# Patient Record
Sex: Female | Born: 1940
Health system: Southern US, Community
[De-identification: ages and names within clinical notes are randomized; demographics above are authoritative.]

## PROBLEM LIST (undated history)

## (undated) DIAGNOSIS — F419 Anxiety disorder, unspecified: Secondary | ICD-10-CM

## (undated) DIAGNOSIS — I2510 Atherosclerotic heart disease of native coronary artery without angina pectoris: Secondary | ICD-10-CM

## (undated) DIAGNOSIS — I340 Nonrheumatic mitral (valve) insufficiency: Secondary | ICD-10-CM

## (undated) DIAGNOSIS — E785 Hyperlipidemia, unspecified: Secondary | ICD-10-CM

## (undated) DIAGNOSIS — F329 Major depressive disorder, single episode, unspecified: Secondary | ICD-10-CM

## (undated) DIAGNOSIS — E039 Hypothyroidism, unspecified: Secondary | ICD-10-CM

## (undated) DIAGNOSIS — I119 Hypertensive heart disease without heart failure: Secondary | ICD-10-CM

## (undated) DIAGNOSIS — I739 Peripheral vascular disease, unspecified: Secondary | ICD-10-CM

## (undated) DIAGNOSIS — I779 Disorder of arteries and arterioles, unspecified: Secondary | ICD-10-CM

## (undated) DIAGNOSIS — K219 Gastro-esophageal reflux disease without esophagitis: Secondary | ICD-10-CM

## (undated) DIAGNOSIS — F32A Depression, unspecified: Secondary | ICD-10-CM

## (undated) HISTORY — DX: Nonrheumatic mitral (valve) insufficiency: I34.0

## (undated) HISTORY — DX: Gastro-esophageal reflux disease without esophagitis: K21.9

## (undated) HISTORY — DX: Hypothyroidism, unspecified: E03.9

## (undated) HISTORY — DX: Atherosclerotic heart disease of native coronary artery without angina pectoris: I25.10

## (undated) HISTORY — DX: Depression, unspecified: F32.A

## (undated) HISTORY — DX: Major depressive disorder, single episode, unspecified: F32.9

## (undated) HISTORY — DX: Hyperlipidemia, unspecified: E78.5

## (undated) HISTORY — DX: Anxiety disorder, unspecified: F41.9

## (undated) HISTORY — PX: CORONARY STENT PLACEMENT: SHX1402

---

## 1998-02-23 ENCOUNTER — Ambulatory Visit (HOSPITAL_COMMUNITY): Admission: RE | Admit: 1998-02-23 | Discharge: 1998-02-23 | Payer: Self-pay | Admitting: Internal Medicine

## 2000-01-31 ENCOUNTER — Ambulatory Visit (HOSPITAL_COMMUNITY): Admission: RE | Admit: 2000-01-31 | Discharge: 2000-01-31 | Payer: Self-pay | Admitting: Otolaryngology

## 2009-12-06 ENCOUNTER — Encounter: Payer: Self-pay | Admitting: Cardiovascular Disease

## 2009-12-19 ENCOUNTER — Encounter: Payer: Self-pay | Admitting: Cardiovascular Disease

## 2009-12-19 ENCOUNTER — Ambulatory Visit: Payer: Self-pay | Admitting: Cardiovascular Disease

## 2009-12-21 ENCOUNTER — Ambulatory Visit: Payer: Self-pay | Admitting: Cardiology

## 2009-12-21 ENCOUNTER — Inpatient Hospital Stay (HOSPITAL_BASED_OUTPATIENT_CLINIC_OR_DEPARTMENT_OTHER): Admission: RE | Admit: 2009-12-21 | Discharge: 2009-12-21 | Payer: Self-pay | Admitting: Cardiology

## 2009-12-22 HISTORY — PX: CORONARY ANGIOPLASTY WITH STENT PLACEMENT: SHX49

## 2009-12-22 HISTORY — PX: CARDIAC CATHETERIZATION: SHX172

## 2009-12-25 ENCOUNTER — Inpatient Hospital Stay (HOSPITAL_COMMUNITY): Admission: RE | Admit: 2009-12-25 | Discharge: 2009-12-26 | Payer: Self-pay | Admitting: Cardiovascular Disease

## 2009-12-25 ENCOUNTER — Ambulatory Visit: Payer: Self-pay | Admitting: Cardiovascular Disease

## 2010-01-02 DIAGNOSIS — I10 Essential (primary) hypertension: Secondary | ICD-10-CM | POA: Insufficient documentation

## 2010-01-02 DIAGNOSIS — E039 Hypothyroidism, unspecified: Secondary | ICD-10-CM | POA: Insufficient documentation

## 2010-01-02 DIAGNOSIS — K219 Gastro-esophageal reflux disease without esophagitis: Secondary | ICD-10-CM

## 2010-01-02 DIAGNOSIS — E785 Hyperlipidemia, unspecified: Secondary | ICD-10-CM

## 2010-01-02 DIAGNOSIS — F411 Generalized anxiety disorder: Secondary | ICD-10-CM | POA: Insufficient documentation

## 2010-01-04 ENCOUNTER — Telehealth: Payer: Self-pay | Admitting: Cardiovascular Disease

## 2010-01-04 ENCOUNTER — Telehealth (INDEPENDENT_AMBULATORY_CARE_PROVIDER_SITE_OTHER): Payer: Self-pay | Admitting: *Deleted

## 2010-01-04 ENCOUNTER — Ambulatory Visit: Payer: Self-pay | Admitting: Cardiology

## 2010-01-04 ENCOUNTER — Inpatient Hospital Stay (HOSPITAL_COMMUNITY): Admission: EM | Admit: 2010-01-04 | Discharge: 2010-01-05 | Payer: Self-pay | Admitting: Emergency Medicine

## 2010-01-09 ENCOUNTER — Ambulatory Visit: Payer: Self-pay | Admitting: Cardiovascular Disease

## 2010-01-10 ENCOUNTER — Telehealth (INDEPENDENT_AMBULATORY_CARE_PROVIDER_SITE_OTHER): Payer: Self-pay | Admitting: *Deleted

## 2010-01-19 ENCOUNTER — Encounter: Payer: Self-pay | Admitting: Cardiovascular Disease

## 2010-01-19 ENCOUNTER — Ambulatory Visit: Payer: Self-pay

## 2010-01-19 DIAGNOSIS — G459 Transient cerebral ischemic attack, unspecified: Secondary | ICD-10-CM | POA: Insufficient documentation

## 2010-01-25 ENCOUNTER — Telehealth: Payer: Self-pay | Admitting: Cardiovascular Disease

## 2010-01-29 ENCOUNTER — Telehealth: Payer: Self-pay | Admitting: Cardiovascular Disease

## 2010-02-04 ENCOUNTER — Emergency Department (HOSPITAL_COMMUNITY): Admission: EM | Admit: 2010-02-04 | Discharge: 2010-02-04 | Payer: Self-pay | Admitting: Emergency Medicine

## 2010-02-16 ENCOUNTER — Ambulatory Visit: Payer: Self-pay | Admitting: Cardiovascular Disease

## 2010-04-30 ENCOUNTER — Telehealth: Payer: Self-pay | Admitting: Cardiovascular Disease

## 2010-04-30 ENCOUNTER — Ambulatory Visit: Payer: Self-pay | Admitting: Cardiovascular Disease

## 2010-05-26 ENCOUNTER — Inpatient Hospital Stay (HOSPITAL_COMMUNITY): Admission: EM | Admit: 2010-05-26 | Discharge: 2010-05-28 | Payer: Self-pay | Admitting: Emergency Medicine

## 2010-10-23 NOTE — Progress Notes (Signed)
  Phone Note Outgoing Call   Call placed by: Dessie Coma LPN  Call placed to: Patient Summary of Call: Patient notified per Dr. Freida Busman, labs were normal.

## 2010-10-23 NOTE — Progress Notes (Signed)
Summary: stent last week/ c/o pain shoulder blade, back, left jaw  Phone Note Call from Patient Call back at Home Phone [phone]   Caller: Patient Reason for Call: Talk to Nurse Summary of Call: stent place last week. c/o pain in shoulder blade/ back/ left jaw.  Initial call taken by: Lorne Skeens,  January 04, 2010 2:54 PM  Follow-up for Phone Call        I spoke with the pt and she is having discomfort between her shoulder blades and left jaw.  The pt's symptoms started this morning.  The pain in her back comes and goes and her jaw pain has been constant. These symptoms are similar to what the pt had prior to her stent placement on 12/25/09.  I had the pt take a NTG while on the phone and she noticied that some of her jaw discomfort was relieved. I advised the pt that she should go back to the hospital for evaluation.  The pt will have a family member drive her to [facility].  Trish notified. Follow-up by: Julieta Gutting, RN, BSN,  January 04, 2010 3:03 PM

## 2010-10-23 NOTE — Miscellaneous (Signed)
Summary: Orders Update  Clinical Lists Changes  Problems: Added new problem of TIA (ICD-435.9) Orders: Added new Test order of Carotid Duplex (Carotid Duplex) - Signed 

## 2010-10-23 NOTE — Progress Notes (Signed)
----   Converted from flag ---- ---- 01/09/2010 4:00 PM, Marilynne Halsted, CMA, AAMA wrote: PT HAS SECURE HOR/UHC MCR, NO PAC RQD  ---- 01/09/2010 2:36 PM, Dessie Coma wrote: Carotid Duplex scheduled for 01/19/10 at [facility]. for TIA-435.9 Thanks, Victorino Dike ------------------------------

## 2010-10-23 NOTE — Progress Notes (Signed)
  Phone Note Outgoing Call   Call placed by: Dessie Coma  LPN,  April 30, 2010 4:04 PM Call placed to: Patient Summary of Call: Patient notified per Dr. Kirke Corin, lab results were fine.

## 2010-10-23 NOTE — Progress Notes (Signed)
  Phone Note Outgoing Call   Call placed by: Dessie Coma LPN Call placed to: Patient Summary of Call: Patient notified per Dr. Freida Busman, ultrasound of arteries in neck was normal meaning she does not have any significant blockages.  To f/u in office in 4 months.

## 2010-10-23 NOTE — Progress Notes (Signed)
  [place] [facility] faxed me records on this pt,gave to Hosie Poisson Mesiemore  January 04, 2010 4:17 PM

## 2010-11-26 ENCOUNTER — Telehealth (INDEPENDENT_AMBULATORY_CARE_PROVIDER_SITE_OTHER): Payer: Self-pay | Admitting: *Deleted

## 2010-11-26 ENCOUNTER — Ambulatory Visit (INDEPENDENT_AMBULATORY_CARE_PROVIDER_SITE_OTHER): Payer: Medicare Other | Admitting: Cardiovascular Disease

## 2010-11-26 DIAGNOSIS — I2510 Atherosclerotic heart disease of native coronary artery without angina pectoris: Secondary | ICD-10-CM

## 2010-11-26 DIAGNOSIS — E7800 Pure hypercholesterolemia, unspecified: Secondary | ICD-10-CM

## 2010-11-26 DIAGNOSIS — R011 Cardiac murmur, unspecified: Secondary | ICD-10-CM

## 2010-11-27 ENCOUNTER — Ambulatory Visit (HOSPITAL_COMMUNITY): Payer: Medicare Other | Attending: Cardiovascular Disease

## 2010-11-27 ENCOUNTER — Encounter: Payer: Self-pay | Admitting: Cardiovascular Disease

## 2010-11-27 DIAGNOSIS — R0609 Other forms of dyspnea: Secondary | ICD-10-CM | POA: Insufficient documentation

## 2010-11-27 DIAGNOSIS — I10 Essential (primary) hypertension: Secondary | ICD-10-CM | POA: Insufficient documentation

## 2010-11-27 DIAGNOSIS — R0989 Other specified symptoms and signs involving the circulatory and respiratory systems: Secondary | ICD-10-CM | POA: Insufficient documentation

## 2010-11-27 DIAGNOSIS — I059 Rheumatic mitral valve disease, unspecified: Secondary | ICD-10-CM | POA: Insufficient documentation

## 2010-11-27 DIAGNOSIS — I2510 Atherosclerotic heart disease of native coronary artery without angina pectoris: Secondary | ICD-10-CM | POA: Insufficient documentation

## 2010-11-27 DIAGNOSIS — E785 Hyperlipidemia, unspecified: Secondary | ICD-10-CM | POA: Insufficient documentation

## 2010-11-27 DIAGNOSIS — R011 Cardiac murmur, unspecified: Secondary | ICD-10-CM | POA: Insufficient documentation

## 2010-11-27 DIAGNOSIS — E039 Hypothyroidism, unspecified: Secondary | ICD-10-CM | POA: Insufficient documentation

## 2010-11-29 ENCOUNTER — Telehealth: Payer: Self-pay | Admitting: Cardiovascular Disease

## 2010-12-04 NOTE — Progress Notes (Signed)
----   Converted from flag ---- ---- 11/26/2010 3:46 PM, Era Bumpers wrote: No precert required for [facility] mcr Echo  ---- 11/26/2010 3:37 PM, Marilynne Halsted, CMA, AAMA wrote:   ---- 11/26/2010 2:12 PM, Dessie Coma  LPN wrote: Echo scheduled at [facility].  for tomorrow 11/27/10 for Dx: Willis Modena.  thanks,Maitri Schnoebelen ------------------------------

## 2010-12-04 NOTE — Progress Notes (Signed)
Summary: echo results  Phone Note Outgoing Call   Call placed by: Dessie Coma  LPN,  November 29, 2010 4:36 PM Call placed to: Patient Summary of Call: Patient notified per Dr. Kirke Corin, Echo done on 11/27/10 is fine.  There is a mild leaking in a heart valve but not serious.

## 2010-12-06 LAB — CBC
MCH: 33 pg (ref 26.0–34.0)
MCHC: 33.5 g/dL (ref 30.0–36.0)
MCV: 95.4 fL (ref 78.0–100.0)
Platelets: 201 10*3/uL (ref 150–400)
RBC: 3.72 MIL/uL — ABNORMAL LOW (ref 3.87–5.11)
RDW: 11.9 % (ref 11.5–15.5)
WBC: 7.6 10*3/uL (ref 4.0–10.5)

## 2010-12-06 LAB — URINE MICROSCOPIC-ADD ON

## 2010-12-06 LAB — POCT I-STAT, CHEM 8
BUN: 8 mg/dL (ref 6–23)
Calcium, Ion: 1.15 mmol/L (ref 1.12–1.32)
Creatinine, Ser: 0.9 mg/dL (ref 0.4–1.2)
Hemoglobin: 13.3 g/dL (ref 12.0–15.0)
Sodium: 140 mEq/L (ref 135–145)
TCO2: 26 mmol/L (ref 0–100)

## 2010-12-06 LAB — COMPREHENSIVE METABOLIC PANEL
Albumin: 3.3 g/dL — ABNORMAL LOW (ref 3.5–5.2)
BUN: 7 mg/dL (ref 6–23)
Calcium: 8.7 mg/dL (ref 8.4–10.5)
Chloride: 105 mEq/L (ref 96–112)
Creatinine, Ser: 0.7 mg/dL (ref 0.4–1.2)
GFR calc Af Amer: >60 mL/min (ref >60)
GFR calc non Af Amer: >60 mL/min (ref >60)
Glucose, Bld: 100 mg/dL — ABNORMAL HIGH (ref 70–99)
Potassium: 4.1 mEq/L (ref 3.5–5.1)
Total Protein: 5.9 g/dL — ABNORMAL LOW (ref 6.0–8.3)

## 2010-12-06 LAB — DIFFERENTIAL
Basophils Absolute: 0 10*3/uL (ref 0.0–0.1)
Basophils Absolute: 0 10*3/uL (ref 0.0–0.1)
Eosinophils Absolute: 0.1 10*3/uL (ref 0.0–0.7)
Eosinophils Absolute: 0.2 10*3/uL (ref 0.0–0.7)
Eosinophils Relative: 2 % (ref 0–5)
Lymphocytes Relative: 18 % (ref 12–46)
Lymphocytes Relative: 28 % (ref 12–46)
Lymphs Abs: 1.2 10*3/uL (ref 0.7–4.0)
Monocytes Absolute: 0.8 10*3/uL (ref 0.1–1.0)

## 2010-12-06 LAB — CULTURE, BLOOD (ROUTINE X 2)

## 2010-12-06 LAB — CARDIAC PANEL(CRET KIN+CKTOT+MB+TROPI)
CK, MB: 0.5 ng/mL (ref 0.3–4.0)
Total CK: 39 U/L (ref 7–177)
Total CK: 42 U/L (ref 7–177)

## 2010-12-06 LAB — URINALYSIS, ROUTINE W REFLEX MICROSCOPIC
Bilirubin Urine: NEGATIVE
Ketones, ur: NEGATIVE mg/dL
Nitrite: NEGATIVE
Protein, ur: NEGATIVE mg/dL
Specific Gravity, Urine: 1.004 — ABNORMAL LOW (ref 1.005–1.030)
Urobilinogen, UA: 0.2 mg/dL (ref 0.0–1.0)
pH: 7.5 (ref 5.0–8.0)

## 2010-12-06 LAB — CK TOTAL AND CKMB (NOT AT ARMC)
CK, MB: 0.4 ng/mL (ref 0.3–4.0)
Relative Index: INVALID (ref 0.0–2.5)

## 2010-12-06 LAB — POCT CARDIAC MARKERS: Myoglobin, poc: 45.3 ng/mL (ref 12–200)

## 2010-12-06 LAB — T3, FREE: T3, Free: 2.8 pg/mL (ref 2.3–4.2)

## 2010-12-10 LAB — POCT CARDIAC MARKERS
CKMB, poc: <1 ng/mL — ABNORMAL LOW (ref 1.0–8.0)
Troponin i, poc: <0.05 ng/mL (ref 0.00–0.09)
Troponin i, poc: <0.05 ng/mL (ref 0.00–0.09)

## 2010-12-10 LAB — DIFFERENTIAL
Basophils Absolute: 0 10*3/uL (ref 0.0–0.1)
Eosinophils Absolute: 0.1 10*3/uL (ref 0.0–0.7)
Eosinophils Relative: 1 % (ref 0–5)
Lymphocytes Relative: 21 % (ref 12–46)
Lymphs Abs: 1.2 10*3/uL (ref 0.7–4.0)
Monocytes Absolute: 0.6 10*3/uL (ref 0.1–1.0)

## 2010-12-10 LAB — BASIC METABOLIC PANEL
Chloride: 105 mEq/L (ref 96–112)
GFR calc non Af Amer: >60 mL/min (ref >60)
Glucose, Bld: 91 mg/dL (ref 70–99)
Potassium: 3.2 mEq/L — ABNORMAL LOW (ref 3.5–5.1)
Sodium: 137 mEq/L (ref 135–145)

## 2010-12-10 LAB — CBC
HCT: 32.9 % — ABNORMAL LOW (ref 36.0–46.0)
Hemoglobin: 11.3 g/dL — ABNORMAL LOW (ref 12.0–15.0)
MCV: 100.7 fL — ABNORMAL HIGH (ref 78.0–100.0)
RDW: 12.8 % (ref 11.5–15.5)

## 2010-12-11 LAB — CBC
HCT: 31.7 % — ABNORMAL LOW (ref 36.0–46.0)
Hemoglobin: 10.7 g/dL — ABNORMAL LOW (ref 12.0–15.0)
MCHC: 33.8 g/dL (ref 30.0–36.0)
RDW: 12.8 % (ref 11.5–15.5)

## 2010-12-12 LAB — BASIC METABOLIC PANEL
BUN: 5 mg/dL — ABNORMAL LOW (ref 6–23)
BUN: 6 mg/dL (ref 6–23)
CO2: 27 mEq/L (ref 19–32)
CO2: 27 mEq/L (ref 19–32)
CO2: 28 mEq/L (ref 19–32)
Calcium: 8.4 mg/dL (ref 8.4–10.5)
Calcium: 8.7 mg/dL (ref 8.4–10.5)
Chloride: 105 mEq/L (ref 96–112)
Chloride: 106 mEq/L (ref 96–112)
Creatinine, Ser: 0.63 mg/dL (ref 0.4–1.2)
Creatinine, Ser: 0.69 mg/dL (ref 0.4–1.2)
GFR calc Af Amer: >60 mL/min (ref >60)
GFR calc Af Amer: >60 mL/min (ref >60)
Glucose, Bld: 110 mg/dL — ABNORMAL HIGH (ref 70–99)
Potassium: 3.7 mEq/L (ref 3.5–5.1)
Sodium: 139 mEq/L (ref 135–145)
Sodium: 141 mEq/L (ref 135–145)

## 2010-12-12 LAB — POCT I-STAT, CHEM 8
BUN: 6 mg/dL (ref 6–23)
Calcium, Ion: 1.2 mmol/L (ref 1.12–1.32)
Chloride: 103 meq/L (ref 96–112)
Creatinine, Ser: 0.7 mg/dL (ref 0.4–1.2)
Glucose, Bld: 102 mg/dL — ABNORMAL HIGH (ref 70–99)
HCT: 35 % — ABNORMAL LOW (ref 36.0–46.0)
Hemoglobin: 11.9 g/dL — ABNORMAL LOW (ref 12.0–15.0)
Potassium: 3.3 mEq/L — ABNORMAL LOW (ref 3.5–5.1)
Sodium: 141 mEq/L (ref 135–145)
TCO2: 29 mmol/L (ref 0–100)

## 2010-12-12 LAB — POCT CARDIAC MARKERS
CKMB, poc: <1 ng/mL — ABNORMAL LOW (ref 1.0–8.0)
Myoglobin, poc: 30 ng/mL (ref 12–200)
Troponin i, poc: <0.05 ng/mL (ref 0.00–0.09)

## 2010-12-12 LAB — PROTIME-INR: Prothrombin Time: 12.7 seconds (ref 11.6–15.2)

## 2010-12-12 LAB — CBC
HCT: 33.3 % — ABNORMAL LOW (ref 36.0–46.0)
HCT: 37.8 % (ref 36.0–46.0)
Hemoglobin: 11.6 g/dL — ABNORMAL LOW (ref 12.0–15.0)
MCHC: 33.5 g/dL (ref 30.0–36.0)
MCHC: 34.4 g/dL (ref 30.0–36.0)
MCV: 100.9 fL — ABNORMAL HIGH (ref 78.0–100.0)
MCV: 101.7 fL — ABNORMAL HIGH (ref 78.0–100.0)
Platelets: 218 10*3/uL (ref 150–400)
Platelets: 231 10*3/uL (ref 150–400)
Platelets: 234 10*3/uL (ref 150–400)
Platelets: 236 10*3/uL (ref 150–400)
RBC: 3.52 MIL/uL — ABNORMAL LOW (ref 3.87–5.11)
RDW: 12.6 % (ref 11.5–15.5)
RDW: 12.8 % (ref 11.5–15.5)
RDW: 12.9 % (ref 11.5–15.5)
WBC: 7.2 10*3/uL (ref 4.0–10.5)
WBC: 7.3 10*3/uL (ref 4.0–10.5)

## 2010-12-12 LAB — CARDIAC PANEL(CRET KIN+CKTOT+MB+TROPI)
Relative Index: INVALID (ref 0.0–2.5)
Troponin I: 0.02 ng/mL (ref 0.00–0.06)

## 2010-12-12 LAB — APTT
aPTT: 30 seconds (ref 24–37)
aPTT: 31 seconds (ref 24–37)

## 2010-12-12 LAB — DIFFERENTIAL
Basophils Absolute: 0 10*3/uL (ref 0.0–0.1)
Basophils Relative: 0 % (ref 0–1)
Eosinophils Absolute: 0 10*3/uL (ref 0.0–0.7)
Eosinophils Relative: 1 % (ref 0–5)
Neutrophils Relative %: 68 % (ref 43–77)

## 2010-12-12 LAB — CK TOTAL AND CKMB (NOT AT ARMC): Total CK: 40 U/L (ref 7–177)

## 2010-12-14 NOTE — Assessment & Plan Note (Signed)
[facility] CARDIOLOGY OFFICE NOTE  NAME:Janice Glass                    MRN:          [id number] DATE:11/26/2010                            DOB:          07/02/1941   Janice Glass is a 70 year old female who is here today for a followup visit.  She has the following problem list: 1. Coronary artery disease status post percutaneous coronary     intervention with a drug-eluting stent to the proximal left     anterior descending artery in April 2011.  The stent was 3.0 x 15     mm PROMUS.  Repeat catheterization showed patent stent.  She does     have mild 30% stenosis in proximal RCA.  Ejection fraction was 65%. 2. Hypertension. 3. Hyperlipidemia. 4. Hypothyroidism. 5. Depression.  INTERVAL HISTORY:  Since her last visit, the patient was hospitalized for dehydration and depression.  She was started on Cymbalta and subsequently improved.  The patient continues to complain of easy bruising with any slight trauma.  She is also complaining about the cost of Plavix and really wants to get off that medication.  Overall, she has not had any significant episodes of chest pain.  She does have slight dyspnea and she is not aware of any previous history of a heart murmur. She denies any dizziness, syncope, or presyncope.  MEDICATIONS: 1. Levothyroxine 50 mcg once daily. 2. Amlodipine 5 mg once daily. 3. Simvastatin 40 mg at bedtime. 4. Plavix 75 mg once daily. 5. Cymbalta 60 mg once daily. 6. Metoprolol 25 mg twice daily. 7. Aspirin 81 mg once daily. 8. Fish oil 1000 mg twice daily. 9. Ranitidine 150 mg twice daily.  PHYSICAL EXAMINATION:  VITAL SIGNS:  Weight is 128 pounds, blood pressure is 113/63, pulse is 64, oxygen saturation is 97% on room air. NECK:  No JVD or carotid bruits. LUNGS:  Clear to auscultation. HEART:  Regular rate and rhythm with no gallops.  There is a 2/6 systolic ejection murmur at the base of  the heart and also at the left sternal border. ABDOMEN:  Benign, nontender, nondistended. EXTREMITIES:  No clubbing, cyanosis, or edema. SKIN:  She has scattered bruising in the upper and lower extremities.  IMPRESSION: 1. Coronary artery disease status post angioplasty and drug-eluting     stent placement to the proximal left anterior descending in April     2011.  The patient is stable from a cardiac standpoint with no     symptoms suggestive of angina.  She is complaining about taking     Plavix.  Her concerns are easy and extensive bruising as well as     the cost.  She did have a drug-eluting stent and thus I recommend     minimal treatment of 1 year.  Her Plavix can be stopped in April of     this year.  At that time, I asked her to start taking aspirin 325     mg once daily instead of 81 mg once daily.  We will continue with     her other cardiac medications. 2. Mild dyspnea with a new  murmur:  We will request an echocardiogram     for further evaluation.  The murmur is most likely benign, but we     will get further evaluation given her symptoms of dyspnea. 3. Hyperlipidemia:  We will continue treatment with simvastatin. 4. Hypertension:  Blood pressure is well-controlled.  She will follow     up in 6 months from now or earlier if needed.    Janice Bears, MD Electronically Signed   MA/MedQ  DD: 11/26/2010  DT: 11/27/2010  Job #: [id number]

## 2011-01-22 ENCOUNTER — Other Ambulatory Visit: Payer: Self-pay | Admitting: Family Medicine

## 2011-01-22 ENCOUNTER — Encounter (INDEPENDENT_AMBULATORY_CARE_PROVIDER_SITE_OTHER): Payer: Medicare Other | Admitting: *Deleted

## 2011-01-22 DIAGNOSIS — I6529 Occlusion and stenosis of unspecified carotid artery: Secondary | ICD-10-CM

## 2011-01-29 ENCOUNTER — Encounter: Payer: Self-pay | Admitting: Family Medicine

## 2011-02-05 NOTE — Assessment & Plan Note (Signed)
[facility]                        [place] CARDIOLOGY OFFICE NOTE   NAME:Janice Glass, Janice Glass                    MRN:          [id number]  DATE:04/30/2010                            DOB:          1941/07/07    PROBLEM LIST:  1. Coronary artery disease, status post percutaneous coronary      intervention of proximal left anterior descending with a 3.0 x 15      mm Promus stent in April 2011 after positive stress test.  She had      relook catheterization for chest pain which showed patent stent.      At that time, catheterization showed mild-to-moderate disease as      well and diagonal branches which were relatively small size.  The      right coronary artery was relatively small with a 30% proximal      stenosis.  Ejection fraction was 65%.  2. Hypertension.  3. Hyperlipidemia.  4. Hypothyroidism.   INTERVAL HISTORY:  Since the last visit the patient has been doing well  overall from a cardiac standpoint with no complaints of chest pain,  dyspnea, palpitations, presyncope, or syncope.  She is complaining of  generalized bruising in the upper and lower extremities with any kind of  little trauma.  She does have pets at home including a dog and a cat,  and these seems to cause some of the problems.  She denies any blood in  the stool or any other bleeding manifestations.  She continues to be  active and does all her activities of daily living.  The dose of her  aspirin was decreased to 81 mg in July.  She is still taking Plavix.   MEDICATIONS:  1. Levothyroxine 50 mcg once daily.  2. Amlodipine 5 mg daily.  3. Simvastatin 40 mg nightly.  4. Plavix 75 mg once daily.  5. Citalopram 40 mg once daily.  6. Metoprolol 25 mg twice daily.  7. Aspirin 81 mg once daily.  8. Fish oil 1000 mg twice daily.  9. Ranitidine 150 mg q.12 h.  10.Vitamin D 3000 units twice daily.  11.Vitamin B12 100 mcg once daily.   ALLERGIES:  SULFA and CODEINE.   PHYSICAL  EXAMINATION:  VITAL SIGNS:  Weight of 120 pounds, blood  pressure is 123/74, pulse is 74, oxygen saturation is 97% on room air.  NECK:  No JVD or carotid bruits.  LUNGS:  Clear to auscultation.  CARDIOVASCULAR:  Regular rate and rhythm with no gallops or murmurs.  ABDOMEN:  Benign, nontender, nondistended.  EXTREMITIES:  With no clubbing, cyanosis, or edema.  Pedal pulses are  normal.  SKIN:  Scattered and extensive bruises in the upper and lower  extremities.   IMPRESSION:  1. Coronary artery disease, status post angioplasty and a drug-eluting      stent placement to the proximal left anterior descending in April      2011:  The patient is doing well from a cardiac standpoint with no      symptoms suggestive of angina at this time.  We will continue with  dual antiplatelet therapy at least until April 2012.  We will      continue aggressive treatment of risk factors.  2. Extensive bruising in the upper and lower extremities since she has      been on dual antiplatelet therapy.  She does have history of anemia      as well.  Unfortunately, we cannot stop any of her aspirin and      Plavix safely at this time due to presence of a drug-eluting stent      to proximal left anterior descending.  We will have to make sure      that there is no other etiologies to explain this.  Thus, I will go      ahead and check her CBC, CMP, and PT/INR.  I discussed with her the      importance of balancing her diet and eating more vegetables and      fruits to get her nutritional needs.  3. Hyperlipidemia.  Her lipid panel is at target with LDL of 53 and      HDL of 62.  4. Hypertension.  Blood pressure is well controlled.  The patient will      follow up in 6 months from now or earlier if needed.     Lorine Bears, MD  Electronically Signed    MA/MedQ  DD: 04/30/2010  DT: 04/30/2010  Job #: [id number]

## 2011-02-05 NOTE — Letter (Signed)
December 19, 2009    Feliciana Rossetti, MD  [address]  [place], [place]  [address]   RE:  CALIROSE, MCCANCE  MRN:  [id number]  /  DOB:  1940-09-26   Dear Dr. Shary Decamp:   I had the pleasure of seeing Ms. Patrone in clinic this morning.  As you  know, she is a 70 year old female recently had a positive stress test  and has been having some atypical discomfort in her back.  More  concerning to me is the increased dyspnea on exertion that she has been  experiencing for quite sometime.  I have asked her to institute therapy  with aspirin 81 mg daily and will proceed with a left heart  catheterization to definitively evaluate her coronary arteries.  We will  keep you informed as to the results.   Thank for the referral as patient.  Please contact my office if I can be  of further assistance.    Sincerely,      Brayton El, MD  Electronically Signed    SGA/MedQ  DD: 12/19/2009  DT: 12/19/2009  Job #: [id number]

## 2011-02-05 NOTE — Assessment & Plan Note (Signed)
[facility]                        [place] CARDIOLOGY OFFICE NOTE   NAME:Glass, Janice DARTY                    MRN:          [id number]  DATE:12/19/2009                            DOB:          July 26, 1941    CHIEF COMPLAINT:  Positive stress test.   HISTORY OF PRESENT ILLNESS:  Janice Glass is a 70 year old white female  with past medical history significant for hypertension, hyperlipidemia,  hypothyroidism, and anxiety presenting after a positive stress test.  The patient states that 4 weeks ago when she was lifting something above  her head with both arms, she had sudden onset of back pain.  She states  it was in her upper back and around her spine.  Since then she has been  having intermittent back pain that is sometimes brought on by exertion  and relieved with pain medications.  Of note, nitroglycerin was not  helpful in relieving this pain.  The patient also states she has been  experiencing increased shortness of breath on exertion over the past  several weeks.  She thinks that this shortness of breath on exertion  predates the back discomfort.  Along with these symptoms, the patient  states she has had intermittent chest discomfort that feels like a  squeezing.  She states that it is happening less frequently than once a  week.  She has been seen in the emergency room for these symptoms and  recently had a stress test ordered by Dr. Shary Decamp that noted inferior and  inferobasal ischemia with an ejection fraction of 78%.   PAST MEDICAL HISTORY:  As above in HPI.  The patient also states that  she was told she had a fracture in her spine in the past.  She has had  several C-sections.  Other medical history as in HPI.   SOCIAL HISTORY:  No tobacco, no alcohol.   FAMILY HISTORY:  Negative for premature coronary disease.   ALLERGIES:  SULFA.   MEDICATIONS:  1. Ranitidine 300 mg daily.  2. Levothyroxine 50 mcg daily.  3. Amlodipine 5 mg  daily.  4. Citalopram 20 mg daily.   REVIEW OF SYSTEMS:  As in HPI.  In addition, the patient endorses  anxiety in concern of her husband who has had several strokes in the  past couple of years.  Other systems as in HPI, otherwise negative.   PHYSICAL EXAMINATION:  VITAL SIGNS:  Blood pressure is 139/80, pulse 72,  saturating 98% on room air, she weighs 129 pounds.  GENERAL:  No acute distress.  HEENT:  Normocephalic, atraumatic.  NECK:  Supple.  There is no JVD.  No carotid bruits.  HEART:  Regular rate and rhythm without murmur, rub, or gallop.  LUNGS:  Clear bilaterally.  ABDOMEN:  Soft, nontender, nondistended.  EXTREMITIES:  Without edema.  MUSCULOSKELETAL:  The patient has no tenderness to palpation over her  vertebrae.  The pain cannot be reproduced through a rotation of the  thorax or elevation of the arms.  The patient has 5/5 bilateral upper  and lower extremity strength.  SKIN:  Warm and dry.  PSYCHIATRIC:  The patient  is appropriate but does appear mildly anxious.   Review of a stress test as above in HPI.  EKG taken today in clinic  independently interpreted by myself demonstrates normal sinus rhythm  without any significant ST or T-wave abnormalities.   ASSESSMENT:  A 70 year old female with hypertension, hyperlipidemia, and  anxiety presenting with a positive stress test and atypical symptoms.  Most concerning is the patient's increased dyspnea on exertion, although  this could be a component of her anxiety.  Although, the  inferior/inferobasal abnormality on the stress test could represent a  false positive or merely be from dropout near the base, I think it  important to proceed with a left heart catheterization.   PLAN:  We will ask the patient to institute therapy with aspirin 81 mg  daily.  She will not be given nitroglycerin as this did not help her  back pain in the past.  Most concerning is her shortness of breath.  We  will arrange for a left heart  catheterization in the upcoming week.  The  risk and benefits of the procedure were explained to the patient and she  wishes to proceed.     Brayton El, MD  Electronically Signed    SGA/MedQ  DD: 12/19/2009  DT: 12/20/2009  Job #: [id number]

## 2011-02-05 NOTE — Assessment & Plan Note (Signed)
[facility]                        [place] CARDIOLOGY OFFICE NOTE   NAME:Brewbaker, Janice Glass                    MRN:          [id number]  DATE:01/09/2010                            DOB:          28-Apr-1941    PROBLEMS LIST:  1. Coronary artery disease status post percutaneous coronary      intervention of the left anterior descending coronary artery with a      3.0 x 15 Promus stent on December 25, 2009, after a stress test that      was positive in the inferior distribution.  2. Hypertension.  3. Hypothyroidism.  4. Hyperlipidemia.   INTERVAL HISTORY:  Since the last visit, the patient underwent a  diagnostic heart catheterization which showed a 72% by IVUS stenosis in  the LAD.  She subsequently underwent intervention of the LAD without any  complications.  The patient did well at home and approximately 1 week  after the intervention, she had acute onset of back pain and left-sided  facial numbness.  She reported back to [facility], at which time, a  relook heart catheterization showed a patent stent and nonobstructive  disease elsewhere.  Since that time, her facial numbness has completely  resolved, and she has not had any further back or chest discomfort.  Of  note, the patient has a history of shingles on the left side of her face  and does have some chronic tingling although she had never experienced  numbness in that area before.  She has been relatively active since her  intervention and is able to walk reasonable distances without any  dyspnea or chest discomfort.  She has been compliant with her  medications.  She has questions regarding appropriate diet.   PHYSICAL EXAMINATION:  Her blood pressure is 115/70, heart rate is 76.  She weighs 129 pounds which is what she weighed at the end of March and  she is saturating 99% on room air.  GENERAL:  No acute distress.  HEENT:  Normocephalic, atraumatic.  NECK:  Supple.  There is no JVD.  There  are no carotid bruits.  HEART:  Regular rate and rhythm without murmur, rub, or gallop.  LUNGS:  Clear bilaterally.  ABDOMEN:  Soft, nontender, nondistended.  EXTREMITIES:  Without edema.  SKIN:  Warm and dry.   ASSESSMENT AND PLAN:  1. Coronary disease.  The patient is status post drug-eluding stent      placement in the early part of April 2011.  She should continue on      full-dose aspirin and Plavix.  After 3 months, we can decrease her      aspirin to 81 mg daily, but she should continue on dual      antiplatelet therapy for least a year.  She should also continue on      statin therapy.  She may take fish oil 1000 mg daily if she wished      to take an additional supplement.  She is encouraged to participate      in cardiac rehab if she can afford the co-pay.  If not, she can  continue on daily physical activity with a goal of 30-60 minutes a      day.  2. Hyperlipidemia.  The patient was started on simvastatin while in      the hospital.  We currently do not have a fasting lipid profile.      We will check one today.  3. Hypertension.  Blood pressure is under excellent control.  4. Left-sided facial numbness.  Her numbness is somewhat concerning      for a transient ischemic attack although it is not clear because      she has tingling in that area chronically.  We will proceed with      bilateral carotid Dopplers to rule out significant carotid      stenosis.  5. Hypokalemia.  The patient has a history of recent hypokalemia.  We      will check a potassium today.  She will follow up in 4 months' time      and contact our office earlier if a problem arises.     Brayton El, MD  Electronically Signed    SGA/MedQ  DD: 01/09/2010  DT: 01/10/2010  Job #: [id number]   cc:   Guy Franco, P.A. in Dr. Anders Grant office

## 2011-02-05 NOTE — Assessment & Plan Note (Signed)
[facility]                        [place] CARDIOLOGY OFFICE NOTE   NAME:Janice Glass, Janice Glass                    MRN:          [id number]  DATE:02/16/2010                            DOB:          September 24, 1940    PROBLEM LIST:  1. Coronary artery disease, status post PCI of the LAD with a 3 x 15      mm Promus stent on December 25, 2009, after a stress test was positive      in inferior distribution, subsequent relook catheterization showed      patent stent.  2. Hypertension.  3. Hypothyroidism.  4. Hyperlipidemia.   INTERVAL HISTORY:  Since her last visit the patient reported to the  emergency room with chest discomfort after an anxiety provoking event.  She states that it was a sharp-type discomfort that did not radiate  without associated symptoms that evolved into a dull-type discomfort  that was not significantly helped by nitroglycerin.  In the emergency  room, her labs including troponin were completely within normal limits  as was her EKG.  She was discharged from the emergency room with a  diagnosis of anxiety and probable noncardiac chest pain.  Since that  time, she has not had recurrence of the chest discomfort and has been  going about her normal state of health.  She remains active with a part-  time job and church activities and is able to carry out these activities  without any difficulty.  She is compliant with all of her medications  including dual-antiplatelet therapy.   PHYSICAL EXAMINATION:  VITAL SIGNS:  Her blood pressure is 131/74, pulse  66, sating 98% on room air, and She weighs 125 pounds which is 3 or 4  pounds less than she weighed in April.  GENERAL:  No acute distress.  HEENT:  Normocephalic and atraumatic.  HEART:  Regular rate and rhythm without murmur, rub, or gallop.  LUNGS:  Clear bilaterally.  ABDOMEN:  Soft, nontender, nondistended.  EXTREMITIES:  Without edema.  SKIN:  Warm and dry.  EXTREMITIES:  There is a  bruising on her extremities.   Review of the patient's labs from the end of April, her vitamin D was  53.  Her CMP was completely within normal limits.  Her cholesterol  profile, HDL 63, triglyceride 70, and LDL 54.   ASSESSMENT AND PLAN:  1. Coronary artery disease.  The patient is currently not having any      angina.  The chest discomfort she experienced prior to her      presentation to the emergency room sounds like it was related to      anxiety.  She should continue on a full-dose aspirin until      beginning of July at which time she should change that to baby      aspirin and should continue on dual antiplatelet therapy with      Plavix and aspirin at least until April 2012.  The patient continue      on statin therapy.  We will institute therapy with metoprolol 25 mg      p.o. b.i.d. a  day.  The patient states she had difficulty      tolerating fish oil, so she is taking that intermittently.  2. Hyperlipidemia.  The patient should continue on simvastatin.  Her      cholesterol profile is excellent.  3. Hypertension.  Blood pressure is at goal.  She should continue on      current medications.     Brayton El, MD  Electronically Signed    SGA/MedQ  DD: 02/16/2010  DT: 02/16/2010  Job #: [id number]   cc:   Guy Franco, P.A.

## 2011-02-28 ENCOUNTER — Other Ambulatory Visit: Payer: Self-pay | Admitting: *Deleted

## 2011-03-04 ENCOUNTER — Telehealth: Payer: Self-pay | Admitting: Cardiovascular Disease

## 2011-03-04 MED ORDER — METOPROLOL TARTRATE 25 MG PO TABS: 25.0000 mg | ORAL_TABLET | Freq: Two times a day (BID) | ORAL | Status: DC

## 2011-03-04 NOTE — Telephone Encounter (Signed)
Pt needs Metoprolol called into [facility].

## 2011-03-05 ENCOUNTER — Other Ambulatory Visit: Payer: Self-pay | Admitting: *Deleted

## 2011-03-05 MED ORDER — METOPROLOL TARTRATE 25 MG PO TABS: 25.0000 mg | ORAL_TABLET | Freq: Two times a day (BID) | ORAL | Status: DC

## 2011-03-18 ENCOUNTER — Encounter: Payer: Self-pay | Admitting: Cardiovascular Disease

## 2011-03-18 ENCOUNTER — Other Ambulatory Visit: Payer: Self-pay | Admitting: *Deleted

## 2011-03-18 MED ORDER — ATORVASTATIN CALCIUM 40 MG PO TABS: 40.0000 mg | ORAL_TABLET | Freq: Every day | ORAL | Status: DC

## 2011-03-29 IMAGING — CR DG CHEST 1V PORT
1 series · 1 of 1 positions shown · non-contrast
Comparison: None.

CLINICAL DATA: Chest and upper back pain for 6 weeks, left facial
numbness

PORTABLE CHEST - 1 VIEW

[view not recorded]
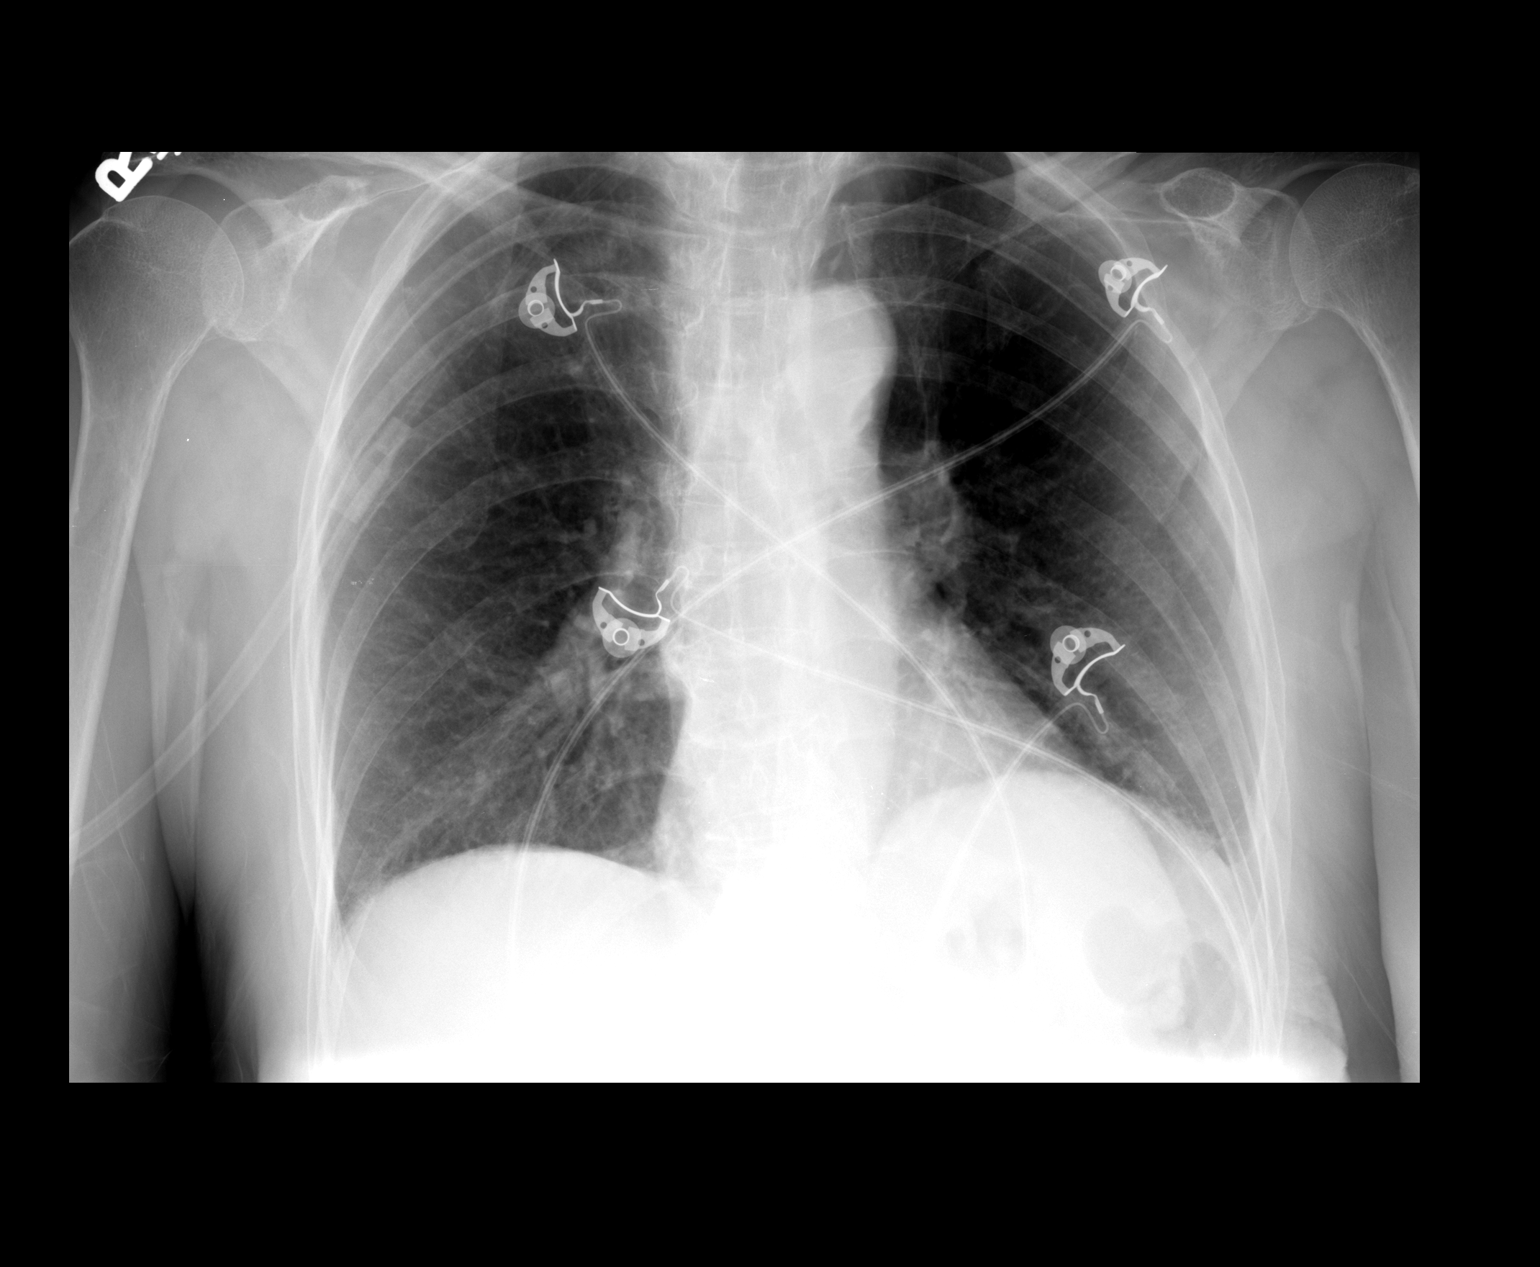

[1 of 1 positions shown; findings below may reference images not displayed]

FINDINGS: The lungs are clear.  Mediastinal contours are normal.
The heart is mildly enlarged.  No acute bony abnormality is seen.
IMPRESSION: Mild cardiomegaly.  No active lung disease.

## 2011-04-24 ENCOUNTER — Encounter: Payer: Self-pay | Admitting: Cardiovascular Disease

## 2011-05-30 ENCOUNTER — Encounter: Payer: Self-pay | Admitting: Cardiovascular Disease

## 2011-06-03 ENCOUNTER — Encounter: Payer: Self-pay | Admitting: Cardiovascular Disease

## 2011-06-06 ENCOUNTER — Ambulatory Visit (INDEPENDENT_AMBULATORY_CARE_PROVIDER_SITE_OTHER): Payer: Medicare Other | Admitting: Cardiovascular Disease

## 2011-06-06 ENCOUNTER — Encounter: Payer: Self-pay | Admitting: Cardiovascular Disease

## 2011-06-06 DIAGNOSIS — I10 Essential (primary) hypertension: Secondary | ICD-10-CM

## 2011-06-06 DIAGNOSIS — I2510 Atherosclerotic heart disease of native coronary artery without angina pectoris: Secondary | ICD-10-CM | POA: Insufficient documentation

## 2011-06-06 DIAGNOSIS — E785 Hyperlipidemia, unspecified: Secondary | ICD-10-CM

## 2011-06-06 NOTE — Assessment & Plan Note (Signed)
The patient seems to be doing very well from a cardiac standpoint at this time. She has not had any recurrent symptoms suggestive of angina. She did have brief chest discomfort more than 3 months ago which sounds to be musculoskeletal in nature. I recommend continuing medical therapy. She is not on Plavix anymore which was used for one year after her drug-eluting stent.

## 2011-06-06 NOTE — Patient Instructions (Signed)
Your physician recommends that you schedule a follow-up appointment in: 6 months  

## 2011-06-06 NOTE — Assessment & Plan Note (Signed)
Her blood pressure is well controlled. Continue current medications. 

## 2011-06-06 NOTE — Assessment & Plan Note (Signed)
Continue treatment with simvastatin 40 mg once daily. This is being followed by her primary care physician. I asked her to bring a copy of her lipid profile with her next visit. She did not afford atorvastatin in the past but this will likely become cheaper in the near future.

## 2011-06-06 NOTE — Progress Notes (Signed)
HPI  This is a 70 year old female who is here today for a followup visit. She has known history of coronary artery disease status post angioplasty and drug-eluting stent placement to the proximal left anterior descending artery in April of 2011. She had a repeat heart catheterization in the same month due to recurrent chest pain which showed patent stent with no other obstructive disease. Her Plavix was stopped after one year of treatment. Since her last visit, she has been doing reasonably well. She had brief episodes of chest pain which lasted only for a few seconds but that was more than 3 months ago. She hasn't had any chest pain lately. She denies any dyspnea. She is having a hard time sleeping at night. Her depression has improved.  Allergies  Allergen Reactions  . Sulfonamide Derivatives      Current Outpatient Prescriptions on File Prior to Visit  Medication Sig Dispense Refill  . ALPRAZolam (XANAX) 0.25 MG tablet Take 0.25 mg by mouth as needed.        Marland Kitchen amLODipine (NORVASC) 5 MG tablet Take 5 mg by mouth daily.        Marland Kitchen aspirin 325 MG EC tablet Take 325 mg by mouth daily.        Marland Kitchen levothyroxine (SYNTHROID, LEVOTHROID) 50 MCG tablet Take 50 mcg by mouth daily.        . metoprolol tartrate (LOPRESSOR) 25 MG tablet Take 1 tablet (25 mg total) by mouth 2 (two) times daily.  60 tablet  3  . nitroGLYCERIN (NITROSTAT) 0.4 MG SL tablet Place 0.4 mg under the tongue every 5 (five) minutes as needed.        . simvastatin (ZOCOR) 40 MG tablet Take 40 mg by mouth at bedtime.           Past Medical History  Diagnosis Date  . GERD (gastroesophageal reflux disease)   . Hypothyroidism   . Anxiety   . CAD (coronary artery disease)     s/p successful percutaneous coronary intervention and stenting of the proximal laft anterior descending coronary artery w/placement of a 3x15 mm promus drug eluting stent  . HLD (hyperlipidemia)   . HTN (hypertension)   . Heart murmur     Mild MR  . Depression       Past Surgical History  Procedure Date  . Coronary stent placement   . Cesarean section     several   . Cardiac catheterization 12/2009  . Coronary angioplasty with stent placement 12/2009    DES to proximal LAD     Family History  Problem Relation Age of Onset  . Coronary artery disease Neg Hx     premature CAD      History   Social History  . Marital Status: Married    Spouse Name: N/A    Number of Children: N/A  . Years of Education: N/A   Occupational History  . Not on file.   Social History Main Topics  . Smoking status: Never Smoker   . Smokeless tobacco: Not on file   Comment: no tobacco   . Alcohol Use: No  . Drug Use: No  . Sexually Active: Not on file   Other Topics Concern  . Not on file   Social History Narrative  . No narrative on file     PHYSICAL EXAM   BP 114/65  Pulse 78  Ht 5\' 3"  (1.6 m)  Wt 129 lb (58.514 kg)  BMI 22.85 kg/m2  SpO2 97%  Constitutional: She is oriented to person, place, and time. She appears well-developed and well-nourished. No distress.  HENT: No nasal discharge.  Head: Normocephalic and atraumatic.  Eyes: Pupils are equal, round, and reactive to light. Right eye exhibits no discharge. Left eye exhibits no discharge.  Neck: Normal range of motion. Neck supple. No JVD present. No thyromegaly present.  Cardiovascular: Normal rate, regular rhythm, normal heart sounds and intact distal pulses. Exam reveals no gallop and no friction rub.  There is a 1/6 systolic ejection murmur at the aortic area.  Pulmonary/Chest: Effort normal and breath sounds normal. No stridor. No respiratory distress. She has no wheezes. She has no rales. She exhibits no tenderness.  Abdominal: Soft. Bowel sounds are normal. She exhibits no distension. There is no tenderness. There is no rebound and no guarding.  Musculoskeletal: Normal range of motion. She exhibits no edema and no tenderness.  Neurological: She is alert and oriented to  person, place, and time. Coordination normal.  Skin: Skin is warm and dry. No rash noted. She is not diaphoretic. No erythema. No pallor.  Psychiatric: She has a normal mood and affect. Her behavior is normal. Judgment and thought content normal.      ASSESSMENT AND PLAN

## 2011-07-15 ENCOUNTER — Other Ambulatory Visit: Payer: Self-pay | Admitting: Cardiovascular Disease

## 2011-07-15 ENCOUNTER — Telehealth: Payer: Self-pay | Admitting: Cardiovascular Disease

## 2011-07-15 NOTE — Telephone Encounter (Signed)
Pt wants refill metoprolol tart 25 mg called [facility] in [place]

## 2011-07-16 MED ORDER — METOPROLOL TARTRATE 25 MG PO TABS: 25.0000 mg | ORAL_TABLET | Freq: Two times a day (BID) | ORAL | Status: DC

## 2011-08-18 IMAGING — CR DG CHEST 2V
3 series · 3 of 3 positions shown · non-contrast
Comparison: Chest x-ray 02/04/2010

CLINICAL DATA: Weakness.

CHEST - 2 VIEW

[w chest pa]
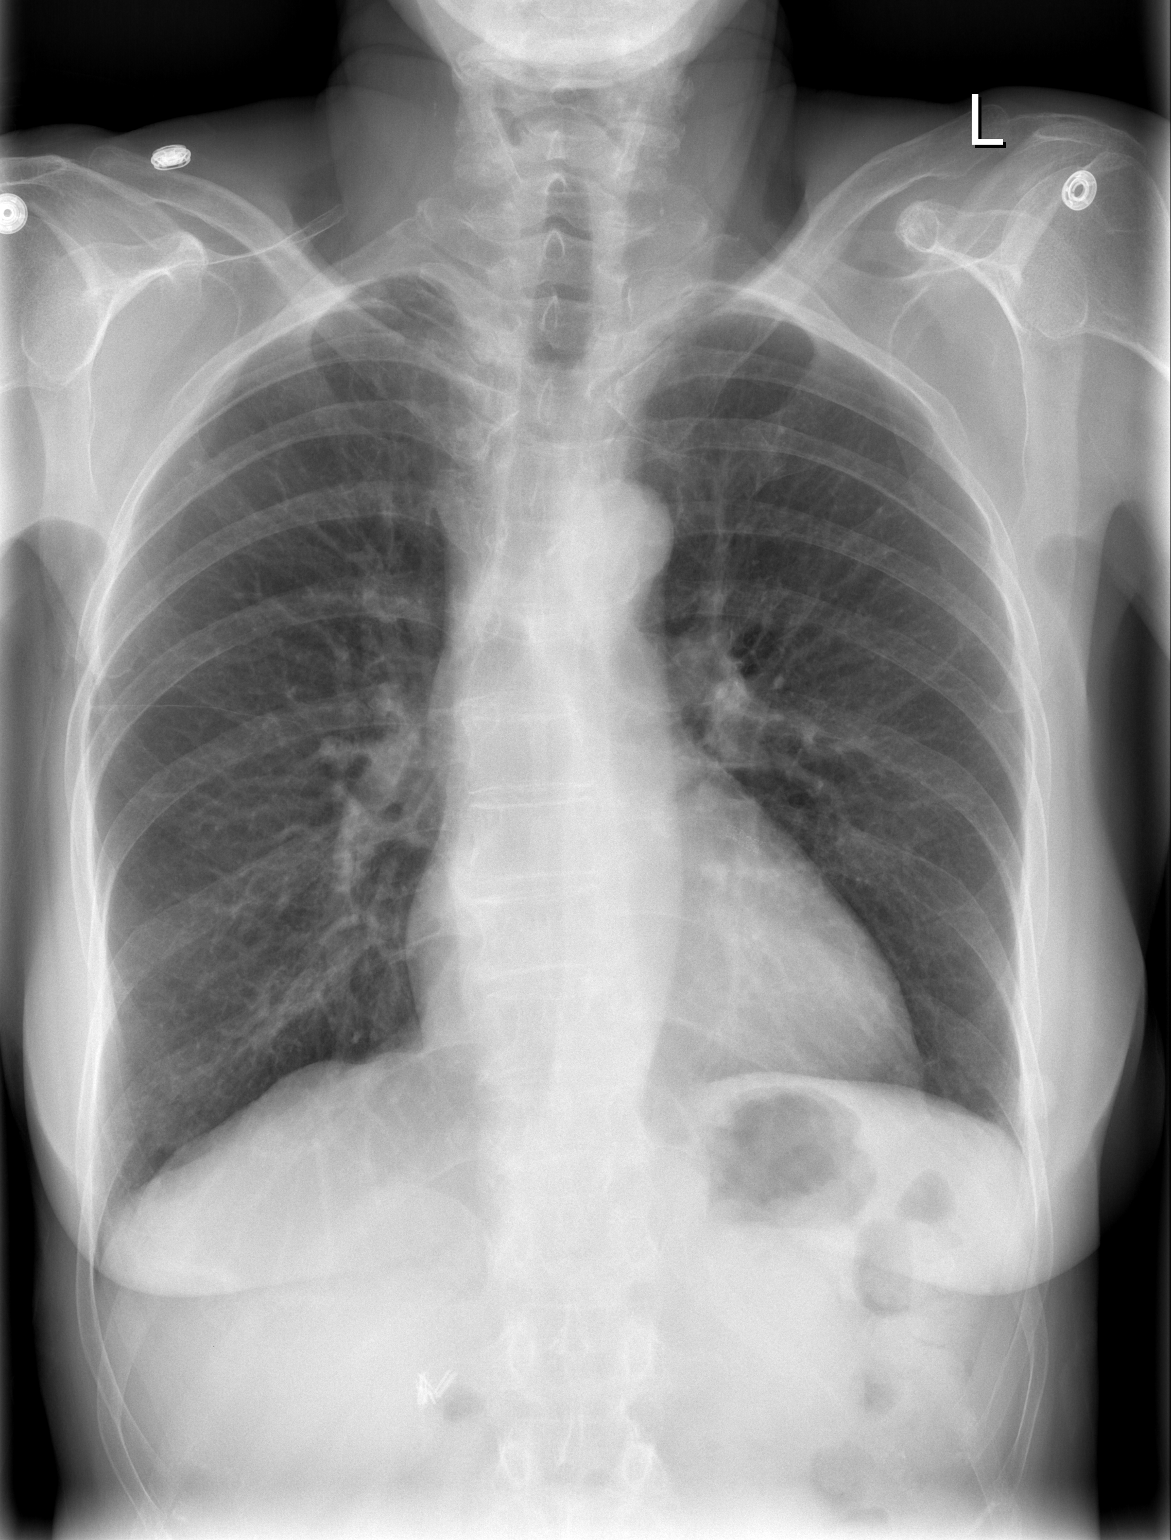

[w chest lat]
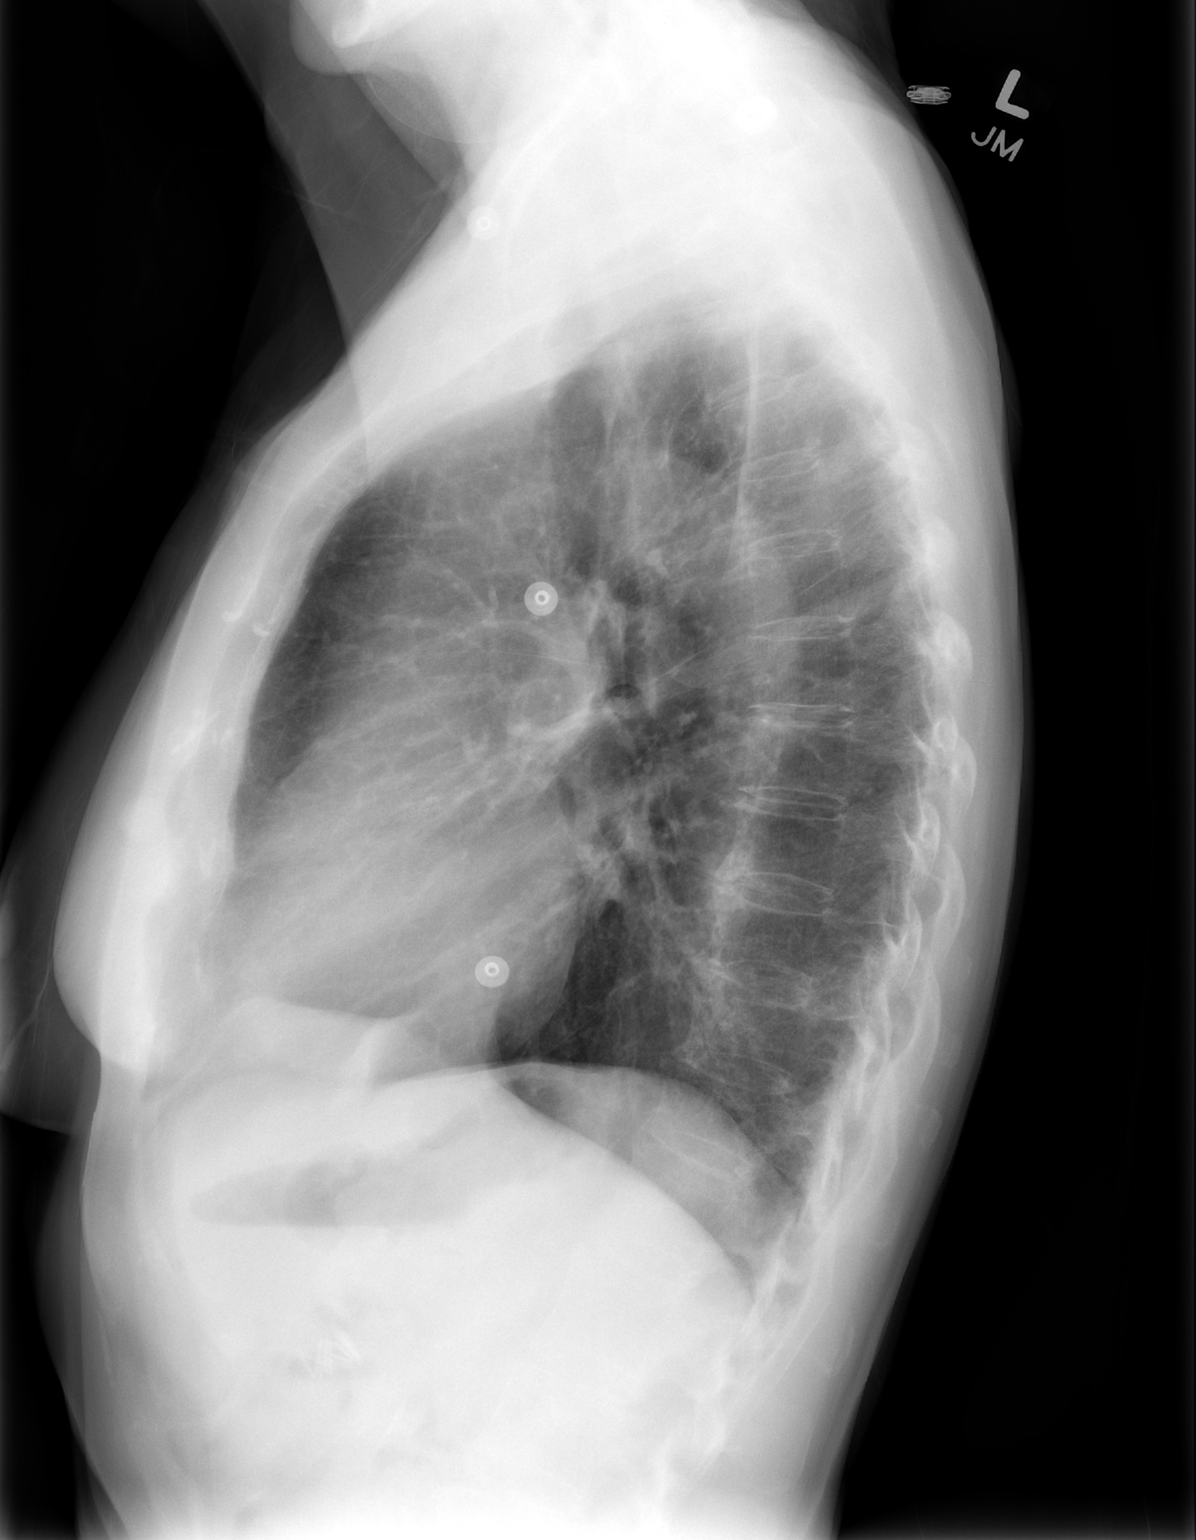

[w chest ap]
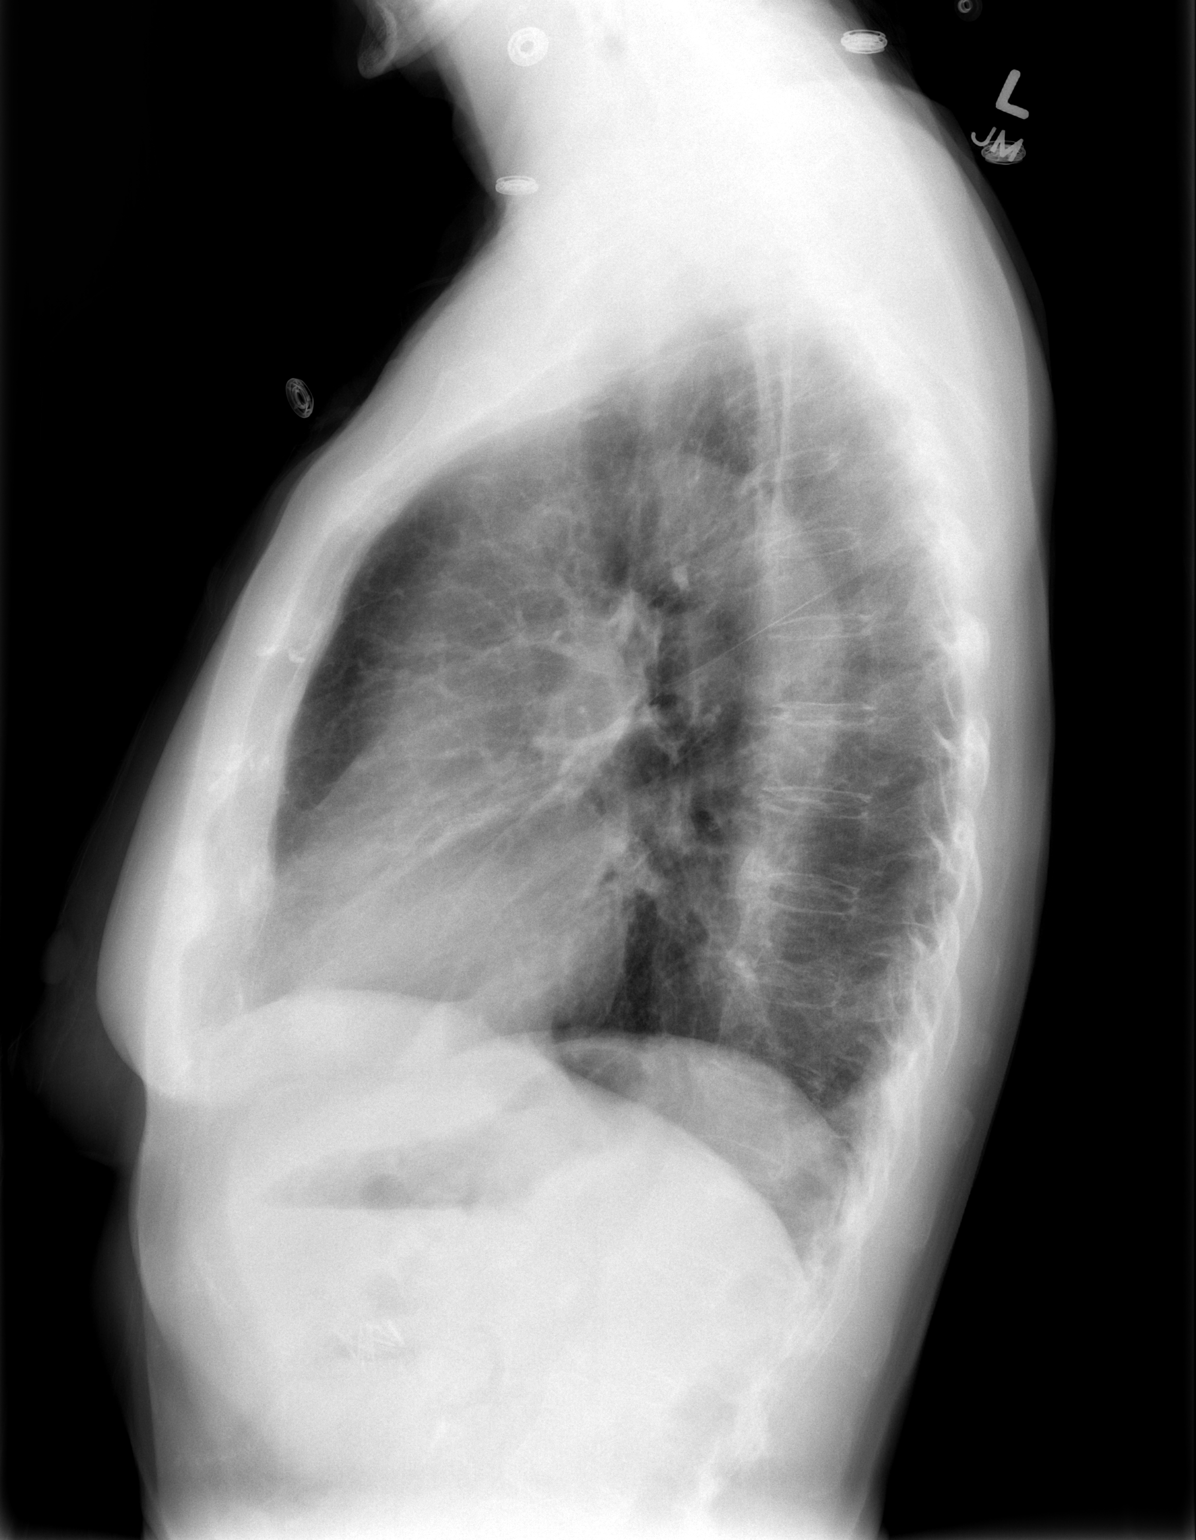

[3 of 3 positions shown; findings below may reference images not displayed]

FINDINGS: The cardiac silhouette, mediastinal and hilar contours
are within normal limits and stable.  There are chronic bronchitic
type lung changes and COPD but no acute pulmonary findings.  Stable
small calcified granuloma in the right upper lobe.  The bony thorax
is intact.
IMPRESSION: Chronic lung changes/COPD but no acute pulmonary findings.

## 2011-11-20 ENCOUNTER — Telehealth: Payer: Self-pay | Admitting: Cardiovascular Disease

## 2011-11-20 NOTE — Telephone Encounter (Signed)
Janice Glass is complaining of sob and shaking x a few days, worse this morning (3 times so far today and it is exertional) and accompanied by sweating.  She had an episode this am when she became weak and felt like her heart was beating very hard.  She was running errands and cleaning out the closet today.  No pain.  She states her fingertips and arm feel numb and tingling off and on x a month.  She also states her heart is beating faster than normal.  For the past few months she has had a feeling of cymbals crashing in her head and pressure in her ears off and on.

## 2011-11-20 NOTE — Telephone Encounter (Signed)
Pt does not want to go to the ER and wants to be seen at our clinic.  Appt scheduled for tomorrow at 9:30am with Dr Elease Hashimoto.  I told her if her symptoms become worse, she needs to go to the ER.

## 2011-11-20 NOTE — Telephone Encounter (Signed)
New Msg: Pt calling to schedule appt to see new MD. Pt former pt of Dr. Kirke Corin and would like to re-est with a MD in [place].   Pt c/o extreme sob, pt sob heard over phone, pt also c/o racing heart for the last couple of days.   Pt would like to re-est with MD. Please return pt call to discuss further ASAP.   Told to send msg around STAT per [facility] triage nurses unavailable; on phone with other pts.

## 2011-11-21 ENCOUNTER — Encounter: Payer: Self-pay | Admitting: Cardiovascular Disease

## 2011-11-21 ENCOUNTER — Ambulatory Visit (INDEPENDENT_AMBULATORY_CARE_PROVIDER_SITE_OTHER): Payer: Medicare Other | Admitting: Cardiovascular Disease

## 2011-11-21 VITALS — BP 128/72 | HR 101 | Ht 64.0 in | Wt 130.0 lb

## 2011-11-21 DIAGNOSIS — R42 Dizziness and giddiness: Secondary | ICD-10-CM

## 2011-11-21 DIAGNOSIS — E785 Hyperlipidemia, unspecified: Secondary | ICD-10-CM

## 2011-11-21 LAB — CBC WITH DIFFERENTIAL/PLATELET
Basophils Relative: 0.1 % (ref 0.0–3.0)
Eosinophils Absolute: 0.1 10*3/uL (ref 0.0–0.7)
HCT: 38.7 % (ref 36.0–46.0)
Hemoglobin: 13.2 g/dL (ref 12.0–15.0)
Lymphocytes Relative: 17.3 % (ref 12.0–46.0)
Lymphs Abs: 1.4 10*3/uL (ref 0.7–4.0)
MCHC: 34 g/dL (ref 30.0–36.0)
MCV: 97.5 fl (ref 78.0–100.0)
Monocytes Absolute: 0.7 10*3/uL (ref 0.1–1.0)
Neutro Abs: 5.8 10*3/uL (ref 1.4–7.7)
RBC: 3.97 Mil/uL (ref 3.87–5.11)

## 2011-11-21 LAB — HEPATIC FUNCTION PANEL
Alkaline Phosphatase: 60 U/L (ref 39–117)
Bilirubin, Direct: 0.2 mg/dL (ref 0.0–0.3)
Total Bilirubin: 1.1 mg/dL (ref 0.3–1.2)

## 2011-11-21 LAB — BASIC METABOLIC PANEL
Calcium: 9.2 mg/dL (ref 8.4–10.5)
Creatinine, Ser: 1 mg/dL (ref 0.4–1.2)
Sodium: 139 mEq/L (ref 135–145)

## 2011-11-21 MED ORDER — SIMVASTATIN 20 MG PO TABS: 20.0000 mg | ORAL_TABLET | Freq: Every day | ORAL | Status: DC

## 2011-11-21 NOTE — Progress Notes (Signed)
Janice Glass Date of Birth  December 08, 1940 [facility]     [place] [facility]  [address]    [address]   [address] [place], [place]  [address]    [place], [place]  [address] [phone]  Fax  [phone]  [phone]  Fax [phone]  Problem list: 1. Coronary artery disease-status post PTCA and stenting of the left anterior descending artery 2. Hypertension 3. Hyperlipidemia 4. Hypothyroidism 5. Depression   History of Present Illness:  Janice Glass is a 71 year old female with the above-noted medical history. She presents today with episodes of severe dizziness and shortness of breath with exertion.  She also notes palpitations. There is no pleuritic chest pain.  These episodes started yesterday morning.  He had a sore throat last week but has not had any worsening of her upper respiratory tract infection.  She has not been started on any new meds.  She started having diarrhea last night and has continued to have diarrhea this morning.  She has these symptoms only when she standing up or walking. They resolved instantly when she sits down or lies down. She slept well last night. She did not have any troubles until she got up again this morning.  She has not had anything to eat or drink today.,  She has taken her medications.  Current Outpatient Prescriptions on File Prior to Visit  Medication Sig Dispense Refill  . ALPRAZolam (XANAX) 0.25 MG tablet Take 0.25 mg by mouth as needed.        Marland Kitchen amLODipine (NORVASC) 5 MG tablet Take 5 mg by mouth daily.        Marland Kitchen aspirin 325 MG EC tablet Take 325 mg by mouth daily.        . Calcium Carbonate-Vitamin D (CALCIUM 600 + D PO) Take 1 tablet by mouth daily.        . Cyanocobalamin (VITAMIN B 12) 100 MCG LOZG Take 1 tablet by mouth daily.        . DULoxetine (CYMBALTA) 60 MG capsule Take 60 mg by mouth daily.        . fish oil-omega-3 fatty acids 1000 MG capsule Take 1 g by mouth daily.        Marland Kitchen levothyroxine (SYNTHROID,  LEVOTHROID) 50 MCG tablet Take 50 mcg by mouth daily.        . metoprolol tartrate (LOPRESSOR) 25 MG tablet Take 1 tablet (25 mg total) by mouth 2 (two) times daily.  60 tablet  3  . nitroGLYCERIN (NITROSTAT) 0.4 MG SL tablet Place 0.4 mg under the tongue every 5 (five) minutes as needed.        Marland Kitchen omeprazole (PRILOSEC) 40 MG capsule Take 1 tablet by mouth daily.      . simvastatin (ZOCOR) 40 MG tablet Take 40 mg by mouth at bedtime.        . temazepam (RESTORIL) 15 MG capsule Take 15 mg by mouth at bedtime as needed.          Allergies  Allergen Reactions  . Sulfonamide Derivatives     Past Medical History  Diagnosis Date  . GERD (gastroesophageal reflux disease)   . Hypothyroidism   . Anxiety   . CAD (coronary artery disease)     s/p successful percutaneous coronary intervention and stenting of the proximal laft anterior descending coronary artery w/placement of a 3x15 mm promus drug eluting stent  . HLD (hyperlipidemia)   . HTN (hypertension)   . Heart murmur  Mild MR  . Depression     Past Surgical History  Procedure Date  . Coronary stent placement   . Cesarean section     several   . Cardiac catheterization 12/2009  . Coronary angioplasty with stent placement 12/2009    DES to proximal LAD    History  Smoking status  . Never Smoker   Smokeless tobacco  . Not on file  Comment: no tobacco     History  Alcohol Use No    Family History  Problem Relation Age of Onset  . Coronary artery disease Neg Hx     premature CAD     Reviw of Systems:  Reviewed in the HPI.  All other systems are negative.  Physical Exam: Blood pressure 128/72, pulse 101, height 5\' 4"  (1.626 m), weight 130 lb (58.968 kg). General: Well developed, well nourished, in no acute distress.  Head: Normocephalic, atraumatic, sclera non-icteric, mucus membranes are moist,   Neck: Supple. Carotids are 2 + without bruits. No JVD  Lungs: Clear bilaterally to auscultation.  Heart: regular  rate  With normal  S1 S2. No murmurs, gallops or rubs.  Abdomen: Soft, non-tender, non-distended with normal bowel sounds. No hepatomegaly. No rebound/guarding. No masses.  Msk:  Strength and tone are normal  Extremities: No clubbing or cyanosis. No edema.  Distal pedal pulses are 2+ and equal bilaterally.  Neuro: Alert and oriented X 3. Moves all extremities spontaneously.  Psych:  Responds to questions appropriately with a normal affect.  ECG: Junctional tachycardia with a short PR interval. She has nonspecific ST and T wave changes.  Assessment / Plan:

## 2011-11-21 NOTE — Assessment & Plan Note (Signed)
The patient presents with dizziness for the past several days. It also turns out that she's had a viral gastroenteritis and has had diarrhea for the past 2 days. I suspect that her dizziness is related to some volume depletion. In addition, she has not had anything to eat today but she did take her medications.  Orthostatic blood pressure readings were not all that remarkable and I do not think this needs to be sent to the hospital for hydration. She'll need to followup with her general medical doctor for her diarrhea.  There is no evidence of any this is cardiac in nature. She's not having any episodes of chest discomfort. She was to followup in the [place] office.

## 2011-11-21 NOTE — Patient Instructions (Addendum)
Your physician recommends that you schedule a follow-up appointment in: 3 months with Dr Jens Som in [place] or may come back her/ seen for Dr Kirke Corin.  Your physician recommends that you return for lab work in: today  Your physician has recommended you make the following change in your medication:   Decrease simvastatin from 40 mg to 20 mg  INCREASE FLUID/ CANNED SOUP V8 JUICE FOR DEHYDRATION   Your physician recommends that you return for a FASTING lipid profile: 3 MONTHS

## 2011-11-28 ENCOUNTER — Other Ambulatory Visit: Payer: Self-pay | Admitting: Cardiovascular Disease

## 2011-12-02 ENCOUNTER — Other Ambulatory Visit: Payer: Self-pay

## 2011-12-02 MED ORDER — METOPROLOL TARTRATE 25 MG PO TABS: 25.0000 mg | ORAL_TABLET | Freq: Two times a day (BID) | ORAL | Status: DC

## 2011-12-02 NOTE — Telephone Encounter (Signed)
.   Requested Prescriptions   Signed Prescriptions Disp Refills  . metoprolol tartrate (LOPRESSOR) 25 MG tablet 60 tablet 3    Sig: Take 1 tablet (25 mg total) by mouth 2 (two) times daily.    Authorizing Provider: Lewayne Bunting    Ordering User: Lacie Scotts

## 2011-12-03 NOTE — Progress Notes (Signed)
Addended by: Judithe Modest D on: 12/03/2011 03:56 PM   Modules accepted: Orders

## 2012-02-18 ENCOUNTER — Other Ambulatory Visit: Payer: Medicare Other

## 2012-02-18 ENCOUNTER — Ambulatory Visit: Payer: Medicare Other | Admitting: Cardiology

## 2012-03-18 ENCOUNTER — Encounter: Payer: Self-pay | Admitting: Nurse Practitioner

## 2012-03-18 ENCOUNTER — Ambulatory Visit (INDEPENDENT_AMBULATORY_CARE_PROVIDER_SITE_OTHER): Payer: Medicare Other | Admitting: Nurse Practitioner

## 2012-03-18 ENCOUNTER — Encounter: Payer: Self-pay | Admitting: *Deleted

## 2012-03-18 VITALS — BP 120/78 | HR 62 | Wt 133.0 lb

## 2012-03-18 DIAGNOSIS — I10 Essential (primary) hypertension: Secondary | ICD-10-CM

## 2012-03-18 DIAGNOSIS — I2510 Atherosclerotic heart disease of native coronary artery without angina pectoris: Secondary | ICD-10-CM

## 2012-03-18 DIAGNOSIS — I200 Unstable angina: Secondary | ICD-10-CM

## 2012-03-18 DIAGNOSIS — E785 Hyperlipidemia, unspecified: Secondary | ICD-10-CM

## 2012-03-18 DIAGNOSIS — R079 Chest pain, unspecified: Secondary | ICD-10-CM

## 2012-03-18 LAB — BASIC METABOLIC PANEL
CO2: 26 mEq/L (ref 19–32)
Chloride: 103 mEq/L (ref 96–112)
Glucose, Bld: 62 mg/dL — ABNORMAL LOW (ref 70–99)
Potassium: 3.2 mEq/L — ABNORMAL LOW (ref 3.5–5.1)
Sodium: 138 mEq/L (ref 135–145)

## 2012-03-18 LAB — CBC WITH DIFFERENTIAL/PLATELET
Basophils Absolute: 0 10*3/uL (ref 0.0–0.1)
Eosinophils Relative: 1.7 % (ref 0.0–5.0)
HCT: 35.3 % — ABNORMAL LOW (ref 36.0–46.0)
Lymphocytes Relative: 28.8 % (ref 12.0–46.0)
Monocytes Relative: 10.9 % (ref 3.0–12.0)
Neutrophils Relative %: 58.6 % (ref 43.0–77.0)
Platelets: 187 10*3/uL (ref 150.0–400.0)
WBC: 5.9 10*3/uL (ref 4.5–10.5)

## 2012-03-18 LAB — APTT: aPTT: 27.9 s (ref 21.7–28.8)

## 2012-03-18 LAB — PROTIME-INR: INR: 1 ratio (ref 0.8–1.0)

## 2012-03-18 NOTE — Progress Notes (Signed)
Patient Name: Janice Glass Date of Encounter: 03/18/2012  Primary Care Provider:  Feliciana Rossetti, MD Primary Cardiologist:  T. Riley Kill, MD  Patient Profile  71 y/o female w/ h/o CAD who presents w/ recurrent angina.  Problem List   Past Medical History  Diagnosis Date  . GERD (gastroesophageal reflux disease)   . Hypothyroidism   . Anxiety   . CAD (coronary artery disease)     a, 11/2009 Abnl myoview - inferobasal ischemia;  b. 12/2009 PCI/DES to pLAD w/ 3x15 mm Promus DES;  c. Relook Cath 01/08/2010: patent stent, nonobs dzs.  Marland Kitchen HLD (hyperlipidemia)   . HTN (hypertension)   . Mild mitral regurgitation   . Depression    Past Surgical History  Procedure Date  . Coronary stent placement   . Cesarean section     several   . Cardiac catheterization 12/2009  . Coronary angioplasty with stent placement 12/2009    DES to proximal LAD    Allergies  Allergies  Allergen Reactions  . Sulfonamide Derivatives     HPI  71 y/o female with the above problem list.  She has a h/o CAD dating back to 2011 @ which time she developed intermittent left jaw/facial discomfort followed by midscapular and substernal discomfort.  She underwent a myoview, which showed inferobasal ischemia and subsequently cath, revealing LAD dzs.  This was stented with a Promus DES and Ss resolved, though she did have recurrent chest pain approx 2 wks after stenting and relook cath revealed patency of the stent.    She has done well over the past 2 years but last week she began to experience recurrent chin, left facial, and jaw discomfort with intermittent sscp/pressure and mid-scapular discomfort along with mild DOE.  The facial Ss have been somewhat constant, while the chest/back Ss have been intermittent and not necessarily related to exertion.  She identifies these Ss as being exactly similar to what she had prior to her last PCI.  She has had no chest pain today.  Home Medications  Prior to Admission  medications   Medication Sig Start Date End Date Taking? Authorizing Provider  amLODipine (NORVASC) 5 MG tablet Take 5 mg by mouth daily.     Yes Historical Provider, MD  aspirin 325 MG EC tablet Take 325 mg by mouth daily.     Yes Historical Provider, MD  Calcium Carbonate-Vitamin D (CALCIUM 600 + D PO) Take 1 tablet by mouth daily.     Yes Historical Provider, MD  citalopram (CELEXA) 20 MG tablet Take 20 mg by mouth daily.   Yes Historical Provider, MD  Cyanocobalamin (VITAMIN B 12) 100 MCG LOZG Take 1 tablet by mouth daily.     Yes Historical Provider, MD  fish oil-omega-3 fatty acids 1000 MG capsule Take 1 g by mouth daily.     Yes Historical Provider, MD  levothyroxine (SYNTHROID, LEVOTHROID) 50 MCG tablet Take 50 mcg by mouth daily.     Yes Historical Provider, MD  metoprolol tartrate (LOPRESSOR) 25 MG tablet Take 1 tablet (25 mg total) by mouth 2 (two) times daily. 12/02/11  Yes Lewayne Bunting, MD  nitroGLYCERIN (NITROSTAT) 0.4 MG SL tablet Place 0.4 mg under the tongue every 5 (five) minutes as needed.     Yes Historical Provider, MD  omeprazole (PRILOSEC) 40 MG capsule Take 1 tablet by mouth daily. 05/07/11  Yes Historical Provider, MD  simvastatin (ZOCOR) 20 MG tablet Take 1 tablet (20 mg total) by mouth at bedtime. 11/21/11  Yes Vesta Mixer, MD  temazepam (RESTORIL) 15 MG capsule Take 15 mg by mouth at bedtime as needed.     Yes Historical Provider, MD    Family History  Family History  Problem Relation Age of Onset  . Heart failure Mother     alive @ 20  . Other Father     Died @ young age in MVA    Social History  History   Social History  . Marital Status: Married    Spouse Name: N/A    Number of Children: N/A  . Years of Education: N/A   Occupational History  . Not on file.   Social History Main Topics  . Smoking status: Never Smoker   . Smokeless tobacco: Not on file   Comment: no tobacco   . Alcohol Use: No  . Drug Use: No  . Sexually Active: Not on  file   Other Topics Concern  . Not on file   Social History Narrative   Lives in [place] with Husband who has had 5 CVA's and she is the primary care giver.  She does not routinely exercise but is very active @ home.     Review of Systems General:  No chills, fever, night sweats or weight changes.  Cardiovascular:  +++ c/p, mid scapular back pain, chin/jaw discomfort, and DOE as outlined in the HPI.  No edema, orthopnea, palpitations, paroxysmal nocturnal dyspnea. Dermatological: No rash, lesions/masses Respiratory: No cough, dyspnea Urologic: No hematuria, dysuria Abdominal:   No nausea, vomiting, diarrhea, bright red blood per rectum, melena, or hematemesis Neurologic:  No visual changes, wkns, changes in mental status. All other systems reviewed and are otherwise negative except as noted above.  Physical Exam  Blood pressure 120/78, pulse 62, weight 133 lb (60.328 kg).  General: Pleasant, NAD Psych: Normal affect. Neuro: Alert and oriented X 3. Moves all extremities spontaneously. HEENT: Normal  Neck: Supple without bruits or JVD. Lungs:  Resp regular and unlabored, CTA. Heart: RRR no s3, s4, or soft systolic murmur @ llsb. Abdomen: Soft, non-tender, non-distended, BS + x 4.  Extremities: No clubbing, cyanosis or edema. DP/PT/Radials 2+ and equal bilaterally.  Accessory Clinical Findings  ECG - RSR, 62, no acute st/t changes.  Assessment & Plan  1.  USA/CAD:  Pt presents with a 1 wk h/o intermittent chest and mid-scapular back pain with associated DOE and chin/left facial/jaw discomfort.  She identifies these Ss as being exactly similar to prior angina.  I've discussed her case with Dr. Riley Kill and we will proceed with diagnostic catheterization on Friday.  Pt is agreeable to proceed.  Cont asa, bb, statin, prn ntg.  We will check cbc, bmet, coags today.  2.  HTN:  Stable.  3.  HL:  Cont statin therapy.  4.  Dispo:  Cath Friday.  Pt wishes to f/u with Dr. Riley Kill and we  will arrange this post-cath.    Nicolasa Ducking, NP 03/18/2012, 1:39 PM

## 2012-03-18 NOTE — Patient Instructions (Addendum)
Your physician has requested that you have a cardiac catheterization. Cardiac catheterization is used to diagnose and/or treat various heart conditions. Doctors may recommend this procedure for a number of different reasons. The most common reason is to evaluate chest pain. Chest pain can be a symptom of coronary artery disease (CAD), and cardiac catheterization can show whether plaque is narrowing or blocking your heart's arteries. This procedure is also used to evaluate the valves, as well as measure the blood flow and oxygen levels in different parts of your heart. For further information please visit [email or web address] Please follow instruction sheet, as given.  Pre-op labs today:  cbc/bmet/ptt/pt-inr  Your physician recommends that you schedule a follow-up appointment in: 1 month for a post-cath visit with Dr. Riley Kill

## 2012-03-19 ENCOUNTER — Encounter (HOSPITAL_COMMUNITY): Payer: Self-pay | Admitting: Pharmacy Technician

## 2012-03-19 ENCOUNTER — Other Ambulatory Visit: Payer: Self-pay | Admitting: *Deleted

## 2012-03-19 ENCOUNTER — Telehealth: Payer: Self-pay | Admitting: Cardiology

## 2012-03-19 ENCOUNTER — Other Ambulatory Visit: Payer: Self-pay | Admitting: Cardiology

## 2012-03-19 MED ORDER — POTASSIUM CHLORIDE CRYS ER 20 MEQ PO TBCR: 20.0000 meq | EXTENDED_RELEASE_TABLET | Freq: Every day | ORAL | Status: DC

## 2012-03-19 MED ORDER — NITROGLYCERIN 0.4 MG SL SUBL: 0.4000 mg | SUBLINGUAL_TABLET | SUBLINGUAL | Status: DC | PRN

## 2012-03-19 NOTE — Telephone Encounter (Signed)
I spoke with the pt and answered her questions about cardiac cath. Rx for NTG sent to the pharmacy.

## 2012-03-19 NOTE — Addendum Note (Signed)
Addended by: Ok Anis on: 03/19/2012 09:49 AM   Modules accepted: Orders

## 2012-03-19 NOTE — Telephone Encounter (Signed)
New msg Pt is having procedure tomorrow. She has some questions.

## 2012-03-19 NOTE — Telephone Encounter (Signed)
error 

## 2012-03-20 ENCOUNTER — Encounter (HOSPITAL_COMMUNITY)
Admission: RE | Disposition: A | Discharge status: Home / Self Care | Payer: Self-pay | Source: Ambulatory Visit | Attending: Cardiology

## 2012-03-20 ENCOUNTER — Ambulatory Visit (HOSPITAL_COMMUNITY)
Admission: RE | Admit: 2012-03-20 | Discharge: 2012-03-20 | Disposition: A | Discharge status: Home / Self Care | Payer: Medicare Other | Source: Ambulatory Visit | Attending: Cardiology | Admitting: Cardiology

## 2012-03-20 ENCOUNTER — Encounter (HOSPITAL_COMMUNITY): Payer: Self-pay | Admitting: Cardiology

## 2012-03-20 DIAGNOSIS — I10 Essential (primary) hypertension: Secondary | ICD-10-CM | POA: Insufficient documentation

## 2012-03-20 DIAGNOSIS — I2510 Atherosclerotic heart disease of native coronary artery without angina pectoris: Secondary | ICD-10-CM

## 2012-03-20 DIAGNOSIS — Z9861 Coronary angioplasty status: Secondary | ICD-10-CM | POA: Insufficient documentation

## 2012-03-20 DIAGNOSIS — F3289 Other specified depressive episodes: Secondary | ICD-10-CM | POA: Insufficient documentation

## 2012-03-20 DIAGNOSIS — E785 Hyperlipidemia, unspecified: Secondary | ICD-10-CM | POA: Insufficient documentation

## 2012-03-20 DIAGNOSIS — K219 Gastro-esophageal reflux disease without esophagitis: Secondary | ICD-10-CM | POA: Insufficient documentation

## 2012-03-20 DIAGNOSIS — I200 Unstable angina: Secondary | ICD-10-CM

## 2012-03-20 DIAGNOSIS — E039 Hypothyroidism, unspecified: Secondary | ICD-10-CM | POA: Insufficient documentation

## 2012-03-20 DIAGNOSIS — I359 Nonrheumatic aortic valve disorder, unspecified: Secondary | ICD-10-CM | POA: Insufficient documentation

## 2012-03-20 DIAGNOSIS — F329 Major depressive disorder, single episode, unspecified: Secondary | ICD-10-CM | POA: Insufficient documentation

## 2012-03-20 HISTORY — PX: LEFT HEART CATHETERIZATION WITH CORONARY ANGIOGRAM: SHX5451

## 2012-03-20 LAB — BASIC METABOLIC PANEL
Calcium: 8.8 mg/dL (ref 8.4–10.5)
Creatinine, Ser: 0.77 mg/dL (ref 0.50–1.10)
GFR calc Af Amer: >90 mL/min (ref >90)
GFR calc non Af Amer: 84 mL/min — ABNORMAL LOW (ref >90)
Sodium: 139 mEq/L (ref 135–145)

## 2012-03-20 LAB — D-DIMER, QUANTITATIVE: D-Dimer, Quant: 0.22 ug/mL-FEU (ref 0.00–0.48)

## 2012-03-20 SURGERY — LEFT HEART CATHETERIZATION WITH CORONARY ANGIOGRAM
Anesthesia: LOCAL

## 2012-03-20 MED ORDER — HEPARIN (PORCINE) IN NACL 2-0.9 UNIT/ML-% IJ SOLN
INTRAMUSCULAR | Status: AC
  Filled 2012-03-20: qty 2000

## 2012-03-20 MED ORDER — NITROGLYCERIN 0.2 MG/ML ON CALL CATH LAB
INTRAVENOUS | Status: AC
  Filled 2012-03-20: qty 1

## 2012-03-20 MED ORDER — LIDOCAINE HCL (PF) 1 % IJ SOLN
INTRAMUSCULAR | Status: AC
  Filled 2012-03-20: qty 30

## 2012-03-20 MED ORDER — SODIUM CHLORIDE 0.9 % IV SOLN
1.0000 mL/kg/h | INTRAVENOUS | Status: DC
  Administered 2012-03-20: 1 mL/kg/h via INTRAVENOUS

## 2012-03-20 MED ORDER — ASPIRIN 81 MG PO CHEW: 324.0000 mg | CHEWABLE_TABLET | ORAL | Status: AC

## 2012-03-20 MED ORDER — MIDAZOLAM HCL 2 MG/2ML IJ SOLN
INTRAMUSCULAR | Status: AC
  Filled 2012-03-20: qty 2

## 2012-03-20 MED ORDER — FENTANYL CITRATE 0.05 MG/ML IJ SOLN
INTRAMUSCULAR | Status: AC
  Filled 2012-03-20: qty 2

## 2012-03-20 MED ORDER — SODIUM CHLORIDE 0.9 % IJ SOLN: 3.0000 mL | Freq: Two times a day (BID) | INTRAMUSCULAR | Status: DC

## 2012-03-20 MED ORDER — SODIUM CHLORIDE 0.9 % IV SOLN: 250.0000 mL | INTRAVENOUS | Status: DC | PRN

## 2012-03-20 MED ORDER — SODIUM CHLORIDE 0.9 % IJ SOLN: 3.0000 mL | INTRAMUSCULAR | Status: DC | PRN

## 2012-03-20 NOTE — H&P (View-Only) (Signed)
 Patient Name: Janice Glass Date of Encounter: 03/18/2012  Primary Care Provider:  GRISSO,GREG, MD Primary Cardiologist:  T. Stuckey, MD  Patient Profile  70 y/o female w/ h/o CAD who presents w/ recurrent angina.  Problem List   Past Medical History  Diagnosis Date  . GERD (gastroesophageal reflux disease)   . Hypothyroidism   . Anxiety   . CAD (coronary artery disease)     a, 11/2009 Abnl myoview - inferobasal ischemia;  b. 12/2009 PCI/DES to pLAD w/ 3x15 mm Promus DES;  c. Relook Cath 01/08/2010: patent stent, nonobs dzs.  . HLD (hyperlipidemia)   . HTN (hypertension)   . Mild mitral regurgitation   . Depression    Past Surgical History  Procedure Date  . Coronary stent placement   . Cesarean section     several   . Cardiac catheterization 12/2009  . Coronary angioplasty with stent placement 12/2009    DES to proximal LAD    Allergies  Allergies  Allergen Reactions  . Sulfonamide Derivatives     HPI  70 y/o female with the above problem list.  She has a h/o CAD dating back to 2011 @ which time she developed intermittent left jaw/facial discomfort followed by midscapular and substernal discomfort.  She underwent a myoview, which showed inferobasal ischemia and subsequently cath, revealing LAD dzs.  This was stented with a Promus DES and Ss resolved, though she did have recurrent chest pain approx 2 wks after stenting and relook cath revealed patency of the stent.    She has done well over the past 2 years but last week she began to experience recurrent chin, left facial, and jaw discomfort with intermittent sscp/pressure and mid-scapular discomfort along with mild DOE.  The facial Ss have been somewhat constant, while the chest/back Ss have been intermittent and not necessarily related to exertion.  She identifies these Ss as being exactly similar to what she had prior to her last PCI.  She has had no chest pain today.  Home Medications  Prior to Admission  medications   Medication Sig Start Date End Date Taking? Authorizing Provider  amLODipine (NORVASC) 5 MG tablet Take 5 mg by mouth daily.     Yes Historical Provider, MD  aspirin 325 MG EC tablet Take 325 mg by mouth daily.     Yes Historical Provider, MD  Calcium Carbonate-Vitamin D (CALCIUM 600 + D PO) Take 1 tablet by mouth daily.     Yes Historical Provider, MD  citalopram (CELEXA) 20 MG tablet Take 20 mg by mouth daily.   Yes Historical Provider, MD  Cyanocobalamin (VITAMIN B 12) 100 MCG LOZG Take 1 tablet by mouth daily.     Yes Historical Provider, MD  fish oil-omega-3 fatty acids 1000 MG capsule Take 1 g by mouth daily.     Yes Historical Provider, MD  levothyroxine (SYNTHROID, LEVOTHROID) 50 MCG tablet Take 50 mcg by mouth daily.     Yes Historical Provider, MD  metoprolol tartrate (LOPRESSOR) 25 MG tablet Take 1 tablet (25 mg total) by mouth 2 (two) times daily. 12/02/11  Yes Brian S Crenshaw, MD  nitroGLYCERIN (NITROSTAT) 0.4 MG SL tablet Place 0.4 mg under the tongue every 5 (five) minutes as needed.     Yes Historical Provider, MD  omeprazole (PRILOSEC) 40 MG capsule Take 1 tablet by mouth daily. 05/07/11  Yes Historical Provider, MD  simvastatin (ZOCOR) 20 MG tablet Take 1 tablet (20 mg total) by mouth at bedtime. 11/21/11    Yes Philip J Nahser, MD  temazepam (RESTORIL) 15 MG capsule Take 15 mg by mouth at bedtime as needed.     Yes Historical Provider, MD    Family History  Family History  Problem Relation Age of Onset  . Heart failure Mother     alive @ 91  . Other Father     Died @ young age in MVA    Social History  History   Social History  . Marital Status: Married    Spouse Name: N/A    Number of Children: N/A  . Years of Education: N/A   Occupational History  . Not on file.   Social History Main Topics  . Smoking status: Never Smoker   . Smokeless tobacco: Not on file   Comment: no tobacco   . Alcohol Use: No  . Drug Use: No  . Sexually Active: Not on  file   Other Topics Concern  . Not on file   Social History Narrative   Lives in [place] with Husband who has had 5 CVA's and she is the primary care giver.  She does not routinely exercise but is very active @ home.     Review of Systems General:  No chills, fever, night sweats or weight changes.  Cardiovascular:  +++ c/p, mid scapular back pain, chin/jaw discomfort, and DOE as outlined in the HPI.  No edema, orthopnea, palpitations, paroxysmal nocturnal dyspnea. Dermatological: No rash, lesions/masses Respiratory: No cough, dyspnea Urologic: No hematuria, dysuria Abdominal:   No nausea, vomiting, diarrhea, bright red blood per rectum, melena, or hematemesis Neurologic:  No visual changes, wkns, changes in mental status. All other systems reviewed and are otherwise negative except as noted above.  Physical Exam  Blood pressure 120/78, pulse 62, weight 133 lb (60.328 kg).  General: Pleasant, NAD Psych: Normal affect. Neuro: Alert and oriented X 3. Moves all extremities spontaneously. HEENT: Normal  Neck: Supple without bruits or JVD. Lungs:  Resp regular and unlabored, CTA. Heart: RRR no s3, s4, or soft systolic murmur @ llsb. Abdomen: Soft, non-tender, non-distended, BS + x 4.  Extremities: No clubbing, cyanosis or edema. DP/PT/Radials 2+ and equal bilaterally.  Accessory Clinical Findings  ECG - RSR, 62, no acute st/t changes.  Assessment & Plan  1.  [place]/CAD:  Pt presents with a 1 wk h/o intermittent chest and mid-scapular back pain with associated DOE and chin/left facial/jaw discomfort.  She identifies these Ss as being exactly similar to prior angina.  I've discussed her case with Dr. Stuckey and we will proceed with diagnostic catheterization on Friday.  Pt is agreeable to proceed.  Cont asa, bb, statin, prn ntg.  We will check cbc, bmet, coags today.  2.  HTN:  Stable.  3.  HL:  Cont statin therapy.  4.  Dispo:  Cath Friday.  Pt wishes to f/u with Dr. Stuckey and we  will arrange this post-cath.    Russia Scheiderer, NP 03/18/2012, 1:39 PM   

## 2012-03-20 NOTE — Interval H&P Note (Signed)
History and Physical Interval Note:  03/20/2012 12:43 PM  Janice Glass  has presented today for surgery, with the diagnosis of chest pain  The various methods of treatment have been discussed with the patient. After consideration of risks, benefits and other options for treatment, the patient has consented to  Procedure(s) (LRB): LEFT HEART CATHETERIZATION WITH CORONARY ANGIOGRAM (N/A) as a surgical intervention .  The patient's history has been reviewed, patient examined, no change in status, stable for surgery.  I have reviewed the patients' chart and labs.  Questions were answered to the patient's satisfaction.   We saw her Mr. Brion Aliment in the office this past week.  Her nuclear study in the past was abnormal in a territory remote from the treatment site.  She is concerned about her symptoms.  She thinks it is similar to what she experienced prior to her stent.  We plan restudy.      Janice Glass

## 2012-03-20 NOTE — CV Procedure (Signed)
   Cardiac Catheterization Procedure Note  Name: Janice Glass MRN: [id number] DOB: 1941-08-30  Procedure: Left Heart Cath, Selective Coronary Angiography, LV angiography  Indication: prior stenting with recurrent symptoms   Procedural details: The right groin was prepped, draped, and anesthetized with 1% lidocaine. Using modified Seldinger technique, a 4 French sheath was introduced into the right femoral artery. Standard Judkins catheters were used for coronary angiography and left ventriculography. Catheter exchanges were performed over a guidewire. There were no immediate procedural complications. The patient was transferred to the post catheterization recovery area for further monitoring.  Procedural Findings: Hemodynamics:  AO 135/61 (90) LV 145/11 No gradient on pullback.    Coronary angiography: Coronary dominance: right  Left mainstem: Large in caliber and free of significant disease  Left anterior descending (LAD): Moderately calcified artery that is large in caliber, and has been stented previously.  There is mild, non obstructive proximal irregularity.  The stent size remains widely patent.  There is a focal 30-40% area of narrowing at the bifurcation of the first diagonal.  The distal LAD wraps the apex, and other than calcification, has no major obstruction.    Left circumflex (LCx): Consists of two very large marginal branches.  The mid distal portion of OM1 has 50% eccentric stenosis that does not appear flow limiting.  The OM2 is widely patent.   Right coronary artery (RCA): Provides a single PDA.  There is calcification and approximately 30% eccentric plaque proximally.    Left ventriculography: Left ventricular systolic function is hyperdynamic following PVCs.  Baseline EF cannot be calculated, but post PVC EF is greater than 70%  Final Conclusions:   1.  Continue patency of the previously stented site in the LAD 2.  Vigorous LV function 3.  Scattered  calcified coronary plaque that is not severely obstruction.  Recommendations:  1.  Continue medical therapy.    Shawnie Pons 03/20/2012, 1:28 PM

## 2012-03-20 NOTE — Progress Notes (Signed)
Pt. States she took 324 mg ASA, noncoated this am; therefore Baby Asa not given this am

## 2012-03-20 NOTE — Discharge Instructions (Signed)

## 2012-03-30 ENCOUNTER — Ambulatory Visit: Payer: Medicare Other | Admitting: Cardiology

## 2012-04-10 ENCOUNTER — Telehealth: Payer: Self-pay | Admitting: Cardiovascular Disease

## 2012-04-10 NOTE — Telephone Encounter (Signed)
Metoprolol 90 days refill needed, uses [facility] [place]

## 2012-04-14 ENCOUNTER — Other Ambulatory Visit: Payer: Self-pay | Admitting: *Deleted

## 2012-04-14 MED ORDER — METOPROLOL TARTRATE 25 MG PO TABS: 25.0000 mg | ORAL_TABLET | Freq: Two times a day (BID) | ORAL | Status: DC

## 2012-04-14 NOTE — Telephone Encounter (Signed)
Notified patient Rx was refilled at [facility] for 90 days. She agreed and said thank you.

## 2012-04-14 NOTE — Telephone Encounter (Signed)
Pt now out , needs asap

## 2012-04-15 ENCOUNTER — Other Ambulatory Visit (INDEPENDENT_AMBULATORY_CARE_PROVIDER_SITE_OTHER): Payer: Medicare Other

## 2012-04-15 DIAGNOSIS — E785 Hyperlipidemia, unspecified: Secondary | ICD-10-CM

## 2012-04-15 LAB — BASIC METABOLIC PANEL
BUN: 9 mg/dL (ref 6–23)
Chloride: 103 mEq/L (ref 96–112)
Creatinine, Ser: 0.8 mg/dL (ref 0.4–1.2)

## 2012-04-15 LAB — LIPID PANEL
Cholesterol: 153 mg/dL (ref 0–200)
HDL: 72.1 mg/dL (ref >39.00)
LDL Cholesterol: 70 mg/dL (ref 0–99)
Triglycerides: 54 mg/dL (ref 0.0–149.0)
VLDL: 10.8 mg/dL (ref 0.0–40.0)

## 2012-04-15 LAB — HEPATIC FUNCTION PANEL
ALT: 9 U/L (ref 0–35)
Total Bilirubin: 0.7 mg/dL (ref 0.3–1.2)

## 2012-04-16 ENCOUNTER — Ambulatory Visit: Payer: Medicare Other | Admitting: Cardiology

## 2012-04-16 ENCOUNTER — Other Ambulatory Visit: Payer: Medicare Other

## 2012-04-20 ENCOUNTER — Ambulatory Visit (INDEPENDENT_AMBULATORY_CARE_PROVIDER_SITE_OTHER): Payer: Medicare Other | Admitting: Cardiology

## 2012-04-20 ENCOUNTER — Encounter: Payer: Self-pay | Admitting: Cardiology

## 2012-04-20 VITALS — BP 106/63 | HR 63 | Ht 63.5 in | Wt 136.0 lb

## 2012-04-20 DIAGNOSIS — I2510 Atherosclerotic heart disease of native coronary artery without angina pectoris: Secondary | ICD-10-CM

## 2012-04-20 DIAGNOSIS — E785 Hyperlipidemia, unspecified: Secondary | ICD-10-CM

## 2012-04-20 DIAGNOSIS — R011 Cardiac murmur, unspecified: Secondary | ICD-10-CM

## 2012-04-20 NOTE — Patient Instructions (Signed)
Your physician has requested that you have an echocardiogram. Echocardiography is a painless test that uses sound waves to create images of your heart. It provides your doctor with information about the size and shape of your heart and how well your heart's chambers and valves are working. This procedure takes approximately one hour. There are no restrictions for this procedure.  Your physician wants you to follow-up in: 6 MONTHS with Dr Riley Kill.  You will receive a reminder letter in the mail two months in advance. If you don't receive a letter, please call our office to schedule the follow-up appointment.  Your physician recommends that you continue on your current medications as directed. Please refer to the Current Medication list given to you today.

## 2012-04-20 NOTE — Assessment & Plan Note (Signed)
Patent stent at cath.  Continue medical therapy.  Continue follow up.

## 2012-04-20 NOTE — Assessment & Plan Note (Signed)
Follow up per Dr. Shary Decamp.  Currently on statin.

## 2012-04-20 NOTE — Assessment & Plan Note (Signed)
Has SEM suggestive of mild AS.  Will get echo to confirm.  Could be from hyperdynamic function.

## 2012-04-20 NOTE — Progress Notes (Signed)
HPI:  Patient is seen in followup. She continues to have the symptoms with regard to her left facial region. She's been seen by her primary care physician and his nurse practitioner. She was placed on a short course of prednisone, and also received 2 doses of antibiotics.  She has not had any exertional symptoms.  When she exerts or so she denies exertional chest tightness. She underwent repeat cardiac catheterization. This demonstrated a widely patent stent and other nonobstructive disease her left ventricular ejection fraction was normal. Ejection fraction was 70%  Current Outpatient Prescriptions  Medication Sig Dispense Refill  . amLODipine (NORVASC) 5 MG tablet Take 5 mg by mouth daily.       Marland Kitchen aspirin 325 MG EC tablet Take 325 mg by mouth daily.       . Calcium Carbonate-Vitamin D (CALCIUM 600 + D PO) Take 1 tablet by mouth daily.       . citalopram (CELEXA) 20 MG tablet Take 20 mg by mouth daily.      . Cyanocobalamin (VITAMIN B 12) 100 MCG LOZG Take 1 tablet by mouth daily.       Marland Kitchen GLUCOSAMINE HCL PO Take 1 tablet by mouth daily.      Marland Kitchen levothyroxine (SYNTHROID, LEVOTHROID) 50 MCG tablet Take 50 mcg by mouth daily.       . metoprolol tartrate (LOPRESSOR) 25 MG tablet Take 1 tablet (25 mg total) by mouth 2 (two) times daily.  180 tablet  1  . nitroGLYCERIN (NITROSTAT) 0.4 MG SL tablet Place 1 tablet (0.4 mg total) under the tongue every 5 (five) minutes as needed. For chest pain  25 tablet  3  . omeprazole (PRILOSEC) 40 MG capsule Take 1 tablet by mouth 2 (two) times daily.       Marland Kitchen OVER THE COUNTER MEDICATION Take 1 tablet by mouth daily. Occuvite      . simvastatin (ZOCOR) 20 MG tablet Take 20 mg by mouth every evening.      . temazepam (RESTORIL) 15 MG capsule Take 15 mg by mouth at bedtime as needed. For sleep        Allergies  Allergen Reactions  . Codeine Other (See Comments)    unknown  . Sulfonamide Derivatives Other (See Comments)    unknown    Past Medical History    Diagnosis Date  . GERD (gastroesophageal reflux disease)   . Hypothyroidism   . Anxiety   . CAD (coronary artery disease)     a, 11/2009 Abnl myoview - inferobasal ischemia;  b. 12/2009 PCI/DES to pLAD w/ 3x15 mm Promus DES;  c. Relook Cath 01/08/2010: patent stent, nonobs dzs.  Marland Kitchen HLD (hyperlipidemia)   . HTN (hypertension)   . Mild mitral regurgitation   . Depression     Past Surgical History  Procedure Date  . Coronary stent placement   . Cesarean section     several   . Cardiac catheterization 12/2009  . Coronary angioplasty with stent placement 12/2009    DES to proximal LAD    Family History  Problem Relation Age of Onset  . Heart failure Mother     alive @ 76  . Other Father     Died @ young age in MVA    History   Social History  . Marital Status: Married    Spouse Name: N/A    Number of Children: N/A  . Years of Education: N/A   Occupational History  . Not on file.  Social History Main Topics  . Smoking status: Never Smoker   . Smokeless tobacco: Never Used   Comment: no tobacco   . Alcohol Use: No  . Drug Use: No  . Sexually Active: Not on file   Other Topics Concern  . Not on file   Social History Narrative   Lives in [place] with Husband who has had 5 CVA's and she is the primary care giver.  She does not routinely exercise but is very active @ home.    ROS: Please see the HPI.  All other systems reviewed and negative.  PHYSICAL EXAM:  BP 106/63  Pulse 63  Ht 5' 3.5" (1.613 m)  Wt 136 lb (61.689 kg)  BMI 23.71 kg/m2  General: Well developed, well nourished, in no acute distress. Head:  Normocephalic and atraumatic. Neck: no JVD Lungs: Clear to auscultation and percussion. Heart: Normal S1 and S2.  2/6 SEM.  No diastolic murmur.   Extremities: No clubbing or cyanosis. No edema. Neurologic: Alert and oriented x 3.  EKG:  NSr.  i RBBB.  Minor nonspecific T abnormality  CATH  AO 135/61 (90)  LV 145/11  No gradient on pullback.   Coronary angiography:  Coronary dominance: right  Left mainstem: Large in caliber and free of significant disease  Left anterior descending (LAD): Moderately calcified artery that is large in caliber, and has been stented previously. There is mild, non obstructive proximal irregularity. The stent size remains widely patent. There is a focal 30-40% area of narrowing at the bifurcation of the first diagonal. The distal LAD wraps the apex, and other than calcification, has no major obstruction.  Left circumflex (LCx): Consists of two very large marginal branches. The mid distal portion of OM1 has 50% eccentric stenosis that does not appear flow limiting. The OM2 is widely patent.  Right coronary artery (RCA): Provides a single PDA. There is calcification and approximately 30% eccentric plaque proximally.  Left ventriculography: Left ventricular systolic function is hyperdynamic following PVCs. Baseline EF cannot be calculated, but post PVC EF is greater than 70%  Final Conclusions:  1. Continue patency of the previously stented site in the LAD  2. Vigorous LV function  3. Scattered calcified coronary plaque that is not severely obstruction.  Recommendations:  1. Continue medical therapy.  Shawnie Pons  03/20/2012, 1:28 PM    ASSESSMENT AND PLAN:

## 2012-04-24 ENCOUNTER — Other Ambulatory Visit: Payer: Self-pay

## 2012-04-24 ENCOUNTER — Other Ambulatory Visit (HOSPITAL_COMMUNITY): Payer: Medicare Other

## 2012-04-24 ENCOUNTER — Ambulatory Visit (HOSPITAL_COMMUNITY): Payer: Medicare Other | Attending: Cardiology | Admitting: Radiology

## 2012-04-24 DIAGNOSIS — R011 Cardiac murmur, unspecified: Secondary | ICD-10-CM

## 2012-04-24 DIAGNOSIS — I10 Essential (primary) hypertension: Secondary | ICD-10-CM | POA: Insufficient documentation

## 2012-04-24 DIAGNOSIS — I059 Rheumatic mitral valve disease, unspecified: Secondary | ICD-10-CM | POA: Insufficient documentation

## 2012-04-24 DIAGNOSIS — I2510 Atherosclerotic heart disease of native coronary artery without angina pectoris: Secondary | ICD-10-CM | POA: Insufficient documentation

## 2012-04-24 DIAGNOSIS — I517 Cardiomegaly: Secondary | ICD-10-CM | POA: Insufficient documentation

## 2012-04-24 DIAGNOSIS — E785 Hyperlipidemia, unspecified: Secondary | ICD-10-CM | POA: Insufficient documentation

## 2012-04-24 NOTE — Progress Notes (Signed)
Echocardiogram performed.  

## 2012-05-08 ENCOUNTER — Telehealth: Payer: Self-pay | Admitting: Cardiology

## 2012-05-08 NOTE — Telephone Encounter (Signed)
Pt calling for ECHO results--spoke with pt's husband and gave him these results--ECHO appears "normal", but i explained that had not been read yet by dr Riley Kill, but looks OK--HUSBAND STATES HE WILL LET WIFE KNOW

## 2012-05-08 NOTE — Telephone Encounter (Signed)
Please return call to patient [phone], regarding Echo results

## 2012-05-13 NOTE — Telephone Encounter (Signed)
Notes Recorded by Herby Abraham, MD on 05/11/2012 at 12:49 PM I called patient regarding the results of the studies. She is aware. Would need exam only, and follow up echo probably in three years. TS

## 2012-07-09 ENCOUNTER — Other Ambulatory Visit: Payer: Self-pay | Admitting: Cardiovascular Disease

## 2012-10-23 ENCOUNTER — Telehealth: Payer: Self-pay | Admitting: Cardiology

## 2012-10-23 MED ORDER — SIMVASTATIN 20 MG PO TABS: 20.0000 mg | ORAL_TABLET | Freq: Every evening | ORAL | Status: DC

## 2012-10-23 MED ORDER — METOPROLOL TARTRATE 25 MG PO TABS: 25.0000 mg | ORAL_TABLET | Freq: Two times a day (BID) | ORAL | Status: DC

## 2012-10-23 NOTE — Telephone Encounter (Signed)
New Problem:    Patient called in needing a 90 day refill of her simvastatin (ZOCOR) 20 MG tablet and metoprolol tartrate (LOPRESSOR) 25 MG tablet.  Patient is on her last dose.

## 2013-02-17 ENCOUNTER — Encounter (INDEPENDENT_AMBULATORY_CARE_PROVIDER_SITE_OTHER): Payer: Medicare Other

## 2013-02-17 DIAGNOSIS — I6529 Occlusion and stenosis of unspecified carotid artery: Secondary | ICD-10-CM

## 2013-03-30 ENCOUNTER — Encounter: Payer: Self-pay | Admitting: Cardiovascular Disease

## 2013-03-30 ENCOUNTER — Ambulatory Visit (INDEPENDENT_AMBULATORY_CARE_PROVIDER_SITE_OTHER): Payer: Medicare Other | Admitting: Cardiovascular Disease

## 2013-03-30 VITALS — BP 122/70 | HR 81 | Ht 63.5 in | Wt 148.0 lb

## 2013-03-30 DIAGNOSIS — I2510 Atherosclerotic heart disease of native coronary artery without angina pectoris: Secondary | ICD-10-CM

## 2013-03-30 DIAGNOSIS — I10 Essential (primary) hypertension: Secondary | ICD-10-CM

## 2013-03-30 DIAGNOSIS — R011 Cardiac murmur, unspecified: Secondary | ICD-10-CM

## 2013-03-30 DIAGNOSIS — E785 Hyperlipidemia, unspecified: Secondary | ICD-10-CM

## 2013-03-30 MED ORDER — METOPROLOL TARTRATE 25 MG PO TABS: 25.0000 mg | ORAL_TABLET | Freq: Two times a day (BID) | ORAL | Status: DC

## 2013-03-30 NOTE — Assessment & Plan Note (Signed)
Echocardiogram from last year showed possible bicuspid aortic valve. However, there was no significant stenosis of regurgitation. I recommend repeat echocardiogram in 2-3 years.

## 2013-03-30 NOTE — Patient Instructions (Addendum)
Your physician wants you to follow-up in: 1 YEAR with Dr Arida.  You will receive a reminder letter in the mail two months in advance. If you don't receive a letter, please call our office to schedule the follow-up appointment.  Your physician recommends that you continue on your current medications as directed. Please refer to the Current Medication list given to you today.  

## 2013-03-30 NOTE — Progress Notes (Signed)
HPI: This is a 72 year old female who is here today for a followup visit. She has known history of coronary artery disease with previous drug-eluting stent placement to the LAD. She had a repeat cardiac catheterization in June of last year which showed patent stent with mild disease elsewhere. She has been doing very well from a cardiac standpoint with no recurrent chest pain, dyspnea, palpitations or dizziness. She gained about 20 pounds related to prednisone use. She has been having issues with arthritis and left knee discomfort.  Current Outpatient Prescriptions  Medication Sig Dispense Refill  . amLODipine (NORVASC) 5 MG tablet Take 5 mg by mouth daily.       Marland Kitchen aspirin 325 MG EC tablet Take 325 mg by mouth daily.       . clonazePAM (KLONOPIN) 0.5 MG tablet Take 1 tablet by mouth daily.      Marland Kitchen levothyroxine (SYNTHROID, LEVOTHROID) 50 MCG tablet Take 50 mcg by mouth daily.       . metoprolol tartrate (LOPRESSOR) 25 MG tablet Take 1 tablet (25 mg total) by mouth 2 (two) times daily.  180 tablet  1  . mirtazapine (REMERON) 15 MG tablet Take 1 tablet by mouth daily.      . nitroGLYCERIN (NITROSTAT) 0.4 MG SL tablet Place 1 tablet (0.4 mg total) under the tongue every 5 (five) minutes as needed. For chest pain  25 tablet  3  . omeprazole (PRILOSEC) 40 MG capsule Take 1 tablet by mouth 2 (two) times daily.       Marland Kitchen OVER THE COUNTER MEDICATION Take 1 tablet by mouth daily. Occuvite      . simvastatin (ZOCOR) 20 MG tablet Take 1 tablet (20 mg total) by mouth every evening.  90 tablet  1   No current facility-administered medications for this visit.    Allergies  Allergen Reactions  . Codeine Other (See Comments)    unknown  . Sulfonamide Derivatives Other (See Comments)    unknown    Past Medical History  Diagnosis Date  . GERD (gastroesophageal reflux disease)   . Hypothyroidism   . Anxiety   . CAD (coronary artery disease)     a, 11/2009 Abnl myoview - inferobasal ischemia;  b. 12/2009  PCI/DES to pLAD w/ 3x15 mm Promus DES;  c. Relook Cath 01/08/2010: patent stent, nonobs dzs.  Marland Kitchen HLD (hyperlipidemia)   . HTN (hypertension)   . Mild mitral regurgitation   . Depression     Past Surgical History  Procedure Laterality Date  . Coronary stent placement    . Cesarean section      several   . Cardiac catheterization  12/2009  . Coronary angioplasty with stent placement  12/2009    DES to proximal LAD    Family History  Problem Relation Age of Onset  . Heart failure Mother     alive @ 26  . Other Father     Died @ young age in MVA    History   Social History  . Marital Status: Married    Spouse Name: N/A    Number of Children: N/A  . Years of Education: N/A   Occupational History  . Not on file.   Social History Main Topics  . Smoking status: Never Smoker   . Smokeless tobacco: Never Used     Comment: no tobacco   . Alcohol Use: No  . Drug Use: No  . Sexually Active: Not on file   Other Topics Concern  .  Not on file   Social History Narrative   Lives in [place] with Husband who has had 5 CVA's and she is the primary care giver.  She does not routinely exercise but is very active @ home.    ROS: Please see the HPI.  All other systems reviewed and negative.  PHYSICAL EXAM:  BP 122/70  Pulse 81  Ht 5' 3.5" (1.613 m)  Wt 148 lb (67.132 kg)  BMI 25.8 kg/m2  SpO2 97%  General: Well developed, well nourished, in no acute distress. Head:  Normocephalic and atraumatic. Neck: no JVD Lungs: Clear to auscultation and percussion. Heart: Normal S1 and S2.  2/6 SEM at the aortic area.  No diastolic murmur.   Extremities: No clubbing or cyanosis. No edema. Neurologic: Alert and oriented x 3.  EKG:  NSr.  i RBBB.  Minor nonspecific T abnormality  CATH  AO 135/61 (90)  LV 145/11  No gradient on pullback.  Coronary angiography:  Coronary dominance: right  Left mainstem: Large in caliber and free of significant disease  Left anterior descending  (LAD): Moderately calcified artery that is large in caliber, and has been stented previously. There is mild, non obstructive proximal irregularity. The stent size remains widely patent. There is a focal 30-40% area of narrowing at the bifurcation of the first diagonal. The distal LAD wraps the apex, and other than calcification, has no major obstruction.  Left circumflex (LCx): Consists of two very large marginal branches. The mid distal portion of OM1 has 50% eccentric stenosis that does not appear flow limiting. The OM2 is widely patent.  Right coronary artery (RCA): Provides a single PDA. There is calcification and approximately 30% eccentric plaque proximally.  Left ventriculography: Left ventricular systolic function is hyperdynamic following PVCs. Baseline EF cannot be calculated, but post PVC EF is greater than 70%  Final Conclusions:  1. Continue patency of the previously stented site in the LAD  2. Vigorous LV function  3. Scattered calcified coronary plaque that is not severely obstruction.  Recommendations:  1. Continue medical therapy.  Shawnie Pons  03/20/2012, 1:28 PM    ASSESSMENT AND PLAN:

## 2013-03-30 NOTE — Assessment & Plan Note (Addendum)
I recommend lifelong treatment with a statin given her known history of obstructive coronary artery disease. She is on simvastatin 20 mg daily. Lab Results  Component Value Date   CHOL 153 04/15/2012   HDL 72.10 04/15/2012   LDLCALC 70 04/15/2012   TRIG 54.0 04/15/2012   CHOLHDL 2 04/15/2012

## 2013-03-30 NOTE — Assessment & Plan Note (Signed)
She is doing very well with no recurrent angina. Cardiac catheterization last year showed patent LAD stent. Continue medical therapy.

## 2013-03-30 NOTE — Assessment & Plan Note (Signed)
Blood pressure is well controlled 

## 2013-12-29 ENCOUNTER — Telehealth: Payer: Self-pay | Admitting: Cardiovascular Disease

## 2013-12-29 ENCOUNTER — Telehealth: Payer: Self-pay | Admitting: *Deleted

## 2013-12-29 NOTE — Telephone Encounter (Signed)
[place] office faxed clearance this AM according to documentation.

## 2013-12-29 NOTE — Telephone Encounter (Signed)
°  Sharyn Lull with Dr [facility] office needs clearance today or surgery will be canceled. Please call and advise.

## 2013-12-29 NOTE — Telephone Encounter (Signed)
Cardiac clearance response sent to [facility] 12/29/13

## 2014-08-26 ENCOUNTER — Telehealth: Payer: Self-pay | Admitting: Cardiovascular Disease

## 2014-08-26 NOTE — Telephone Encounter (Signed)
New Msg   Pt calling, wants to get surgical clearance and is asking for an appt. As soon as possible. Provided pt with the option to see a PA on 12/14, but pt is confused as to whether or not this will give [facility] enough time to get her updated info. Procedure is scheduled for 12/15. Pt contact [phone] cell phone [phone].

## 2014-08-30 ENCOUNTER — Ambulatory Visit (INDEPENDENT_AMBULATORY_CARE_PROVIDER_SITE_OTHER): Payer: Medicare HMO | Admitting: Cardiovascular Disease

## 2014-08-30 ENCOUNTER — Encounter: Payer: Self-pay | Admitting: Cardiovascular Disease

## 2014-08-30 VITALS — BP 142/80 | HR 93 | Ht 63.5 in | Wt 161.8 lb

## 2014-08-30 DIAGNOSIS — E785 Hyperlipidemia, unspecified: Secondary | ICD-10-CM

## 2014-08-30 DIAGNOSIS — Z01810 Encounter for preprocedural cardiovascular examination: Secondary | ICD-10-CM

## 2014-08-30 DIAGNOSIS — I2510 Atherosclerotic heart disease of native coronary artery without angina pectoris: Secondary | ICD-10-CM

## 2014-08-30 DIAGNOSIS — I10 Essential (primary) hypertension: Secondary | ICD-10-CM

## 2014-08-30 MED ORDER — ASPIRIN 81 MG PO TBEC: 81.0000 mg | DELAYED_RELEASE_TABLET | Freq: Every day | ORAL | Status: AC

## 2014-08-30 NOTE — Patient Instructions (Signed)
Your physician has recommended you make the following change in your medication:  DECREASE Aspirin to 81mg  take one by mouth daily  Your physician wants you to follow-up in: 1 YEAR with Dr Fletcher Anon.  You will receive a reminder letter in the mail two months in advance. If you don't receive a letter, please call our office to schedule the follow-up appointment.

## 2014-08-30 NOTE — Progress Notes (Signed)
HPI: This is a 73 year old female who is here today for a followup visit. She has known history of coronary artery disease with previous drug-eluting stent placement to the LAD. She had a repeat cardiac catheterization in June of 2013 which showed patent stent with mild disease elsewhere. She has been doing very well from a cardiac standpoint with no recurrent chest pain, dyspnea, palpitations or dizziness.  She fell twice over the last year. No syncope. She injured left knee and needs to have surgery to be done by Dr. Samule Dry She denies chest pain or dyspnea. She is active around the house. Her husband was diagnosed with dementia.   Current Outpatient Prescriptions  Medication Sig Dispense Refill  . amitriptyline (ELAVIL) 10 MG tablet Take 10 mg by mouth at bedtime. Taking three tabs at bedtime.    Marland Kitchen amLODipine (NORVASC) 5 MG tablet Take 5 mg by mouth daily.     Marland Kitchen aspirin 325 MG EC tablet Take 325 mg by mouth daily.     Marland Kitchen gabapentin (NEURONTIN) 600 MG tablet Take 1/2 - 1 pill at bedtime    . HYDROcodone-acetaminophen (NORCO) 10-325 MG per tablet Take 1 tablet by mouth every 6 (six) hours as needed for moderate pain.    Marland Kitchen levothyroxine (SYNTHROID, LEVOTHROID) 50 MCG tablet Take 50 mcg by mouth daily.     . metoprolol tartrate (LOPRESSOR) 25 MG tablet Take 1 tablet (25 mg total) by mouth 2 (two) times daily. 180 tablet 3  . nitroGLYCERIN (NITROSTAT) 0.4 MG SL tablet Place 1 tablet (0.4 mg total) under the tongue every 5 (five) minutes as needed. For chest pain 25 tablet 3  . omeprazole (PRILOSEC) 40 MG capsule Take 1 tablet by mouth 2 (two) times daily.     Marland Kitchen OVER THE COUNTER MEDICATION Take 1 tablet by mouth daily. Occuvite    . simvastatin (ZOCOR) 20 MG tablet Take 1 tablet (20 mg total) by mouth every evening. 90 tablet 1   No current facility-administered medications for this visit.    Allergies  Allergen Reactions  . Codeine Other (See Comments)    unknown  . Sulfa Antibiotics Other  (See Comments)    Unknown reaction  . Sulfonamide Derivatives Other (See Comments)    unknown    Past Medical History  Diagnosis Date  . GERD (gastroesophageal reflux disease)   . Hypothyroidism   . Anxiety   . HLD (hyperlipidemia)   . HTN (hypertension)   . Mild mitral regurgitation   . Depression   . CAD (coronary artery disease)     a, 11/2009 Abnl myoview - inferobasal ischemia;  b. 12/2009 PCI/DES to pLAD w/ 3x15 mm Promus DES;  c. Relook Cath 01/08/2010: patent stent, nonobs dzs. Relook cath 02/2012: patent LAD stent with mild disease    Past Surgical History  Procedure Laterality Date  . Coronary stent placement    . Cesarean section      several   . Cardiac catheterization  12/2009  . Coronary angioplasty with stent placement  12/2009    DES to proximal LAD    Family History  Problem Relation Age of Onset  . Heart failure Mother     alive @ 6  . Other Father     Died @ young age in [place]    History   Social History  . Marital Status: Married    Spouse Name: N/A    Number of Children: N/A  . Years of Education: N/A   Occupational  History  . Not on file.   Social History Main Topics  . Smoking status: Never Smoker   . Smokeless tobacco: Never Used     Comment: no tobacco   . Alcohol Use: No  . Drug Use: No  . Sexual Activity: Not on file   Other Topics Concern  . Not on file   Social History Narrative   Lives in [place] with Husband who has had 5 CVA's and she is the primary care giver.  She does not routinely exercise but is very active @ home.    ROS: Please see the HPI.  All other systems reviewed and negative.  PHYSICAL EXAM:  BP 142/80 mmHg  Pulse 93  Ht 5' 3.5" (1.613 m)  Wt 161 lb 12.8 oz (73.392 kg)  BMI 28.21 kg/m2  General: Well developed, well nourished, in no acute distress. Head:  Normocephalic and atraumatic. Neck: no JVD Lungs: Clear to auscultation and percussion. Heart: Normal S1 and S2.  2/6 SEM at the aortic area.  No  diastolic murmur.   Extremities: No clubbing or cyanosis. No edema. Neurologic: Alert and oriented x 3.    EKG: Normal sinus rhythm with minimal LVH.   ASSESSMENT AND PLAN:

## 2014-09-01 ENCOUNTER — Encounter (HOSPITAL_COMMUNITY): Payer: Self-pay | Admitting: Cardiology

## 2014-09-01 DIAGNOSIS — Z01810 Encounter for preprocedural cardiovascular examination: Secondary | ICD-10-CM | POA: Insufficient documentation

## 2014-09-01 NOTE — Assessment & Plan Note (Addendum)
The patient has known history of coronary artery disease with previous drug-eluting stent placement to the proximal LAD. No recent cardiac events. She has no anginal symptoms and functional capacity is very good. EKG is unremarkable. Thus, she is considered at low risk from a cardiovascular standpoint. Given that she had a previous drug-eluting stent placement, I recommend continuing aspirin without interruption but the dose can be decreased to 81 mg once daily.   Addendum 11/08/2014: The patient reported no new symptoms. Still stable from a cardiac standpoint. Above evaluation is still valid and the patient is considered at low risk from a cardiac standpoint.

## 2014-09-01 NOTE — Assessment & Plan Note (Signed)
Blood pressure is controlled on current medications. 

## 2014-09-01 NOTE — Assessment & Plan Note (Signed)
She is doing well with no symptoms of angina.

## 2014-09-01 NOTE — Assessment & Plan Note (Signed)
Continue treatment with simvastatin with a target LDL of less than 70. 

## 2014-11-09 ENCOUNTER — Telehealth: Payer: Self-pay | Admitting: Cardiovascular Disease

## 2014-11-09 NOTE — Telephone Encounter (Signed)
Surgical clearance was faxed to Dr Samule Dry office on 11/08/14. Pt cleared for surgery by Dr Fletcher Anon. I left a voicemail for Marlowe Kays at Dr Samule Dry office to contact our office if she did not receive clearance.  I spoke with the pt and made her aware that she is cleared for surgery.

## 2014-11-09 NOTE — Telephone Encounter (Signed)
New Msg       Request for surgical clearance:  1. What type of surgery is being performed? Ultra scopic Surgery of the Knee   2. When is this surgery scheduled? No date set until approved   3. Are there any medications that need to be held prior to surgery and how long? No  4. Name of physician performing surgery? Dr. [facility]  5. What is your office phone and fax number? [phone]   Pt states clearance request was faxed on 11/02/14 ,has it been received?  Please return pt call and contact Dr. Samule Dry office as well per pt.

## 2015-05-09 ENCOUNTER — Encounter (HOSPITAL_COMMUNITY): Payer: Medicare HMO

## 2015-05-11 ENCOUNTER — Other Ambulatory Visit: Payer: Self-pay | Admitting: Family Medicine

## 2015-05-11 DIAGNOSIS — I6523 Occlusion and stenosis of bilateral carotid arteries: Secondary | ICD-10-CM

## 2015-05-12 ENCOUNTER — Encounter (HOSPITAL_COMMUNITY): Payer: Medicare HMO

## 2015-05-15 ENCOUNTER — Ambulatory Visit (HOSPITAL_COMMUNITY)
Admission: RE | Admit: 2015-05-15 | Discharge: 2015-05-15 | Disposition: A | Discharge status: Home / Self Care | Payer: Medicare HMO | Source: Ambulatory Visit | Attending: Cardiology | Admitting: Cardiology

## 2015-05-15 DIAGNOSIS — I6523 Occlusion and stenosis of bilateral carotid arteries: Secondary | ICD-10-CM | POA: Diagnosis not present

## 2015-09-05 ENCOUNTER — Encounter: Payer: Self-pay | Admitting: Cardiovascular Disease

## 2015-09-05 ENCOUNTER — Ambulatory Visit (INDEPENDENT_AMBULATORY_CARE_PROVIDER_SITE_OTHER): Payer: Medicare HMO | Admitting: Cardiovascular Disease

## 2015-09-05 VITALS — BP 144/76 | HR 66 | Ht 63.5 in | Wt 156.0 lb

## 2015-09-05 DIAGNOSIS — E785 Hyperlipidemia, unspecified: Secondary | ICD-10-CM | POA: Diagnosis not present

## 2015-09-05 DIAGNOSIS — I2510 Atherosclerotic heart disease of native coronary artery without angina pectoris: Secondary | ICD-10-CM

## 2015-09-05 DIAGNOSIS — I10 Essential (primary) hypertension: Secondary | ICD-10-CM

## 2015-09-05 DIAGNOSIS — R011 Cardiac murmur, unspecified: Secondary | ICD-10-CM | POA: Diagnosis not present

## 2015-09-05 NOTE — Assessment & Plan Note (Signed)
She has a cardiac murmur suggestive of mild aortic stenosis and this has not been evaluated in the recent years. Thus, I requested an echocardiogram.

## 2015-09-05 NOTE — Progress Notes (Signed)
HPI: This is a 74 year old female who is here today for a followup visit. She has known history of coronary artery disease with previous drug-eluting stent placement to the LAD. She had a repeat cardiac catheterization in June of 2013 which showed patent stent with mild disease elsewhere. She has been doing very well from a cardiac standpoint with no recurrent chest pain, dyspnea, palpitations or dizziness.  She underwent left knee surgery early this year in March with no immediate complications. Few weeks later she developed pneumonia followed by colitis. She is back to her baseline. She continues to take care of her husband who has dementia. He currently goes to an optimal daycare which gave her significant relief. She is not to have left carotid bruit but carotid Doppler this year showed only minimal stenosis bilaterally.  Current Outpatient Prescriptions  Medication Sig Dispense Refill  . amLODipine (NORVASC) 5 MG tablet Take 5 mg by mouth daily.     Marland Kitchen aspirin 81 MG EC tablet Take 1 tablet (81 mg total) by mouth daily.    . cetirizine (ZYRTEC) 10 MG tablet Take 10 mg by mouth daily.    Marland Kitchen levothyroxine (SYNTHROID, LEVOTHROID) 50 MCG tablet Take 50 mcg by mouth daily.     . metoprolol tartrate (LOPRESSOR) 25 MG tablet Take 1 tablet (25 mg total) by mouth 2 (two) times daily. 180 tablet 3  . nitroGLYCERIN (NITROSTAT) 0.4 MG SL tablet Place 1 tablet (0.4 mg total) under the tongue every 5 (five) minutes as needed. For chest pain 25 tablet 3  . omeprazole (PRILOSEC) 40 MG capsule Take 1 tablet by mouth 2 (two) times daily.     Marland Kitchen OVER THE COUNTER MEDICATION Take 1 tablet by mouth daily. Occuvite    . Probiotic Product (PROBIOTIC DAILY PO) Take 1 capsule by mouth daily.    Marland Kitchen rOPINIRole (REQUIP) 0.5 MG tablet Take 0.5 mg by mouth daily.    . sertraline (ZOLOFT) 50 MG tablet Take 50 mg by mouth daily.    . simvastatin (ZOCOR) 20 MG tablet Take 1 tablet (20 mg total) by mouth every evening. 90  tablet 1  . vitamin B-12 (CYANOCOBALAMIN) 1000 MCG tablet Take 1,000 mcg by mouth daily.     No current facility-administered medications for this visit.    Allergies  Allergen Reactions  . Codeine Other (See Comments)    unknown  . Sulfa Antibiotics Other (See Comments)    Unknown reaction  . Sulfonamide Derivatives Other (See Comments)    unknown    Past Medical History  Diagnosis Date  . GERD (gastroesophageal reflux disease)   . Hypothyroidism   . Anxiety   . HLD (hyperlipidemia)   . HTN (hypertension)   . Mild mitral regurgitation   . Depression   . CAD (coronary artery disease)     a, 11/2009 Abnl myoview - inferobasal ischemia;  b. 12/2009 PCI/DES to pLAD w/ 3x15 mm Promus DES;  c. Relook Cath 01/08/2010: patent stent, nonobs dzs. Relook cath 02/2012: patent LAD stent with mild disease    Past Surgical History  Procedure Laterality Date  . Coronary stent placement    . Cesarean section      several   . Cardiac catheterization  12/2009  . Coronary angioplasty with stent placement  12/2009    DES to proximal LAD  . Left heart catheterization with coronary angiogram N/A 03/20/2012    Procedure: LEFT HEART CATHETERIZATION WITH CORONARY ANGIOGRAM;  Surgeon: Hillary Bow, MD;  Location:  [facility];  Service: Cardiovascular;  Laterality: N/A;    Family History  Problem Relation Age of Onset  . Heart failure Mother     alive @ 57  . Other Father     Died @ young age in [place]  . Marital Status: Married    Spouse Name: N/A  . Number of Children: N/A  . Years of Education: N/A   Occupational History  . Not on file.   Social History Main Topics  . Smoking status: Never Smoker   . Smokeless tobacco: Never Used     Comment: no tobacco   . Alcohol Use: No  . Drug Use: No  . Sexual Activity: Not on file   Other Topics Concern  . Not on file   Social History Narrative   Lives in [place] with Husband who has had 5 CVA's and  she is the primary care giver.  She does not routinely exercise but is very active @ home.    ROS: Please see the HPI.  All other systems reviewed and negative.  PHYSICAL EXAM:  BP 144/76 mmHg  Pulse 66  Ht 5' 3.5" (1.613 m)  Wt 156 lb (70.761 kg)  BMI 27.20 kg/m2  General: Well developed, well nourished, in no acute distress. Head:  Normocephalic and atraumatic. Neck: no JVD. Left carotid bruit Lungs: Clear to auscultation and percussion. Heart: Normal S1 and S2.  2/6 SEM at the aortic area.  No diastolic murmur.   Extremities: No clubbing or cyanosis. No edema. Neurologic: Alert and oriented x 3.    EKG: Normal sinus rhythm with minimal LVH. Nonspecific T wave changes.   ASSESSMENT AND PLAN:

## 2015-09-05 NOTE — Assessment & Plan Note (Signed)
Blood pressure is reasonably controlled on current medications. 

## 2015-09-05 NOTE — Assessment & Plan Note (Signed)
This is being followed by her primary care physician. She is currently on simvastatin with a target LDL of less than 70.

## 2015-09-05 NOTE — Assessment & Plan Note (Signed)
She is doing very well with no symptoms suggestive of angina. Continue aggressive medical therapy.

## 2015-09-05 NOTE — Patient Instructions (Signed)
Medication Instructions:  Your physician recommends that you continue on your current medications as directed. Please refer to the Current Medication list given to you today.  Labwork: No new orders.   Testing/Procedures: Your physician has requested that you have an echocardiogram. Echocardiography is a painless test that uses sound waves to create images of your heart. It provides your doctor with information about the size and shape of your heart and how well your heart's chambers and valves are working. This procedure takes approximately one hour. There are no restrictions for this procedure.  Follow-Up: Your physician wants you to follow-up in: 1 YEAR with Dr Arida.  You will receive a reminder letter in the mail two months in advance. If you don't receive a letter, please call our office to schedule the follow-up appointment.   Any Other Special Instructions Will Be Listed Below (If Applicable).     If you need a refill on your cardiac medications before your next appointment, please call your pharmacy.   

## 2015-09-21 ENCOUNTER — Other Ambulatory Visit: Payer: Self-pay

## 2015-09-21 ENCOUNTER — Ambulatory Visit (HOSPITAL_COMMUNITY): Payer: Medicare HMO | Attending: Cardiovascular Disease

## 2015-09-21 DIAGNOSIS — I10 Essential (primary) hypertension: Secondary | ICD-10-CM | POA: Insufficient documentation

## 2015-09-21 DIAGNOSIS — I517 Cardiomegaly: Secondary | ICD-10-CM | POA: Insufficient documentation

## 2015-09-21 DIAGNOSIS — I340 Nonrheumatic mitral (valve) insufficiency: Secondary | ICD-10-CM | POA: Insufficient documentation

## 2015-09-21 DIAGNOSIS — R011 Cardiac murmur, unspecified: Secondary | ICD-10-CM | POA: Insufficient documentation

## 2016-05-31 ENCOUNTER — Emergency Department (HOSPITAL_COMMUNITY): Payer: Medicare HMO

## 2016-05-31 ENCOUNTER — Observation Stay (HOSPITAL_COMMUNITY)
Admission: EM | Admit: 2016-05-31 | Discharge: 2016-06-04 | Disposition: A | Discharge status: Home / Self Care | Payer: Medicare HMO | Attending: Cardiology | Admitting: Cardiology

## 2016-05-31 ENCOUNTER — Encounter (HOSPITAL_COMMUNITY): Payer: Self-pay | Admitting: Physical Medicine and Rehabilitation

## 2016-05-31 DIAGNOSIS — E039 Hypothyroidism, unspecified: Secondary | ICD-10-CM | POA: Diagnosis not present

## 2016-05-31 DIAGNOSIS — E785 Hyperlipidemia, unspecified: Secondary | ICD-10-CM | POA: Insufficient documentation

## 2016-05-31 DIAGNOSIS — R0609 Other forms of dyspnea: Secondary | ICD-10-CM | POA: Diagnosis present

## 2016-05-31 DIAGNOSIS — Z79899 Other long term (current) drug therapy: Secondary | ICD-10-CM | POA: Diagnosis not present

## 2016-05-31 DIAGNOSIS — I208 Other forms of angina pectoris: Secondary | ICD-10-CM | POA: Insufficient documentation

## 2016-05-31 DIAGNOSIS — I2510 Atherosclerotic heart disease of native coronary artery without angina pectoris: Secondary | ICD-10-CM | POA: Diagnosis not present

## 2016-05-31 DIAGNOSIS — I2583 Coronary atherosclerosis due to lipid rich plaque: Secondary | ICD-10-CM | POA: Diagnosis not present

## 2016-05-31 DIAGNOSIS — Z955 Presence of coronary angioplasty implant and graft: Secondary | ICD-10-CM | POA: Insufficient documentation

## 2016-05-31 DIAGNOSIS — Z7982 Long term (current) use of aspirin: Secondary | ICD-10-CM | POA: Diagnosis not present

## 2016-05-31 DIAGNOSIS — F329 Major depressive disorder, single episode, unspecified: Secondary | ICD-10-CM | POA: Insufficient documentation

## 2016-05-31 DIAGNOSIS — Z9861 Coronary angioplasty status: Secondary | ICD-10-CM

## 2016-05-31 DIAGNOSIS — I119 Hypertensive heart disease without heart failure: Secondary | ICD-10-CM | POA: Insufficient documentation

## 2016-05-31 DIAGNOSIS — K219 Gastro-esophageal reflux disease without esophagitis: Secondary | ICD-10-CM | POA: Diagnosis not present

## 2016-05-31 DIAGNOSIS — I2089 Other forms of angina pectoris: Secondary | ICD-10-CM | POA: Insufficient documentation

## 2016-05-31 DIAGNOSIS — R079 Chest pain, unspecified: Secondary | ICD-10-CM | POA: Diagnosis present

## 2016-05-31 DIAGNOSIS — F419 Anxiety disorder, unspecified: Secondary | ICD-10-CM | POA: Diagnosis not present

## 2016-05-31 HISTORY — DX: Disorder of arteries and arterioles, unspecified: I77.9

## 2016-05-31 HISTORY — DX: Hypertensive heart disease without heart failure: I11.9

## 2016-05-31 HISTORY — DX: Peripheral vascular disease, unspecified: I73.9

## 2016-05-31 LAB — CREATININE, SERUM
CREATININE: 0.9 mg/dL (ref 0.44–1.00)
GFR calc Af Amer: >60 mL/min (ref >60)
GFR calc non Af Amer: >60 mL/min (ref >60)

## 2016-05-31 LAB — COMPREHENSIVE METABOLIC PANEL
ALT: 13 U/L — AB (ref 14–54)
AST: 22 U/L (ref 15–41)
Albumin: 3.8 g/dL (ref 3.5–5.0)
Alkaline Phosphatase: 69 U/L (ref 38–126)
Anion gap: 8 (ref 5–15)
BUN: 8 mg/dL (ref 6–20)
CHLORIDE: 106 mmol/L (ref 101–111)
CO2: 26 mmol/L (ref 22–32)
CREATININE: 0.77 mg/dL (ref 0.44–1.00)
Calcium: 9.2 mg/dL (ref 8.9–10.3)
GFR calc non Af Amer: >60 mL/min (ref >60)
Glucose, Bld: 115 mg/dL — ABNORMAL HIGH (ref 65–99)
POTASSIUM: 3.4 mmol/L — AB (ref 3.5–5.1)
SODIUM: 140 mmol/L (ref 135–145)
Total Bilirubin: 0.8 mg/dL (ref 0.3–1.2)
Total Protein: 6.2 g/dL — ABNORMAL LOW (ref 6.5–8.1)

## 2016-05-31 LAB — CBC
HCT: 40.1 % (ref 36.0–46.0)
HCT: 40.1 % (ref 36.0–46.0)
HEMOGLOBIN: 12.9 g/dL (ref 12.0–15.0)
Hemoglobin: 12.9 g/dL (ref 12.0–15.0)
MCH: 32.3 pg (ref 26.0–34.0)
MCH: 32.5 pg (ref 26.0–34.0)
MCHC: 32.2 g/dL (ref 30.0–36.0)
MCHC: 32.2 g/dL (ref 30.0–36.0)
MCV: 100.5 fL — AB (ref 78.0–100.0)
MCV: 101 fL — AB (ref 78.0–100.0)
PLATELETS: 240 10*3/uL (ref 150–400)
Platelets: 245 10*3/uL (ref 150–400)
RBC: 3.99 MIL/uL (ref 3.87–5.11)
RBC: 3.97 MIL/uL (ref 3.87–5.11)
RDW: 12.6 % (ref 11.5–15.5)
RDW: 12.6 % (ref 11.5–15.5)
WBC: 6.5 10*3/uL (ref 4.0–10.5)
WBC: 8 10*3/uL (ref 4.0–10.5)

## 2016-05-31 LAB — TSH: TSH: 4.323 u[IU]/mL (ref 0.350–4.500)

## 2016-05-31 LAB — I-STAT TROPONIN, ED: Troponin i, poc: 0 ng/mL (ref 0.00–0.08)

## 2016-05-31 LAB — D-DIMER, QUANTITATIVE: D-Dimer, Quant: 0.31 ug/mL-FEU (ref 0.00–0.50)

## 2016-05-31 LAB — BRAIN NATRIURETIC PEPTIDE: B Natriuretic Peptide: 111.8 pg/mL — ABNORMAL HIGH (ref 0.0–100.0)

## 2016-05-31 LAB — TROPONIN I

## 2016-05-31 MED ORDER — ALPRAZOLAM 0.5 MG PO TABS
0.5000 mg | ORAL_TABLET | Freq: Three times a day (TID) | ORAL | Status: DC | PRN
  Administered 2016-05-31 – 2016-06-03 (×5): 0.5 mg via ORAL
  Filled 2016-05-31 (×5): qty 1

## 2016-05-31 MED ORDER — HEPARIN SODIUM (PORCINE) 5000 UNIT/ML IJ SOLN
5000.0000 [IU] | Freq: Three times a day (TID) | INTRAMUSCULAR | Status: DC
  Administered 2016-05-31 – 2016-06-02 (×7): 5000 [IU] via SUBCUTANEOUS
  Filled 2016-05-31 (×7): qty 1

## 2016-05-31 MED ORDER — SERTRALINE HCL 50 MG PO TABS
50.0000 mg | ORAL_TABLET | Freq: Every day | ORAL | Status: DC
  Administered 2016-06-04: 50 mg via ORAL
  Filled 2016-05-31 (×3): qty 1

## 2016-05-31 MED ORDER — ACETAMINOPHEN 325 MG PO TABS
650.0000 mg | ORAL_TABLET | ORAL | Status: DC | PRN
  Administered 2016-06-01 – 2016-06-03 (×5): 650 mg via ORAL
  Filled 2016-05-31 (×5): qty 2

## 2016-05-31 MED ORDER — ASPIRIN 81 MG PO CHEW
324.0000 mg | CHEWABLE_TABLET | Freq: Once | ORAL | Status: AC
  Administered 2016-05-31: 324 mg via ORAL
  Filled 2016-05-31: qty 4

## 2016-05-31 MED ORDER — REGADENOSON 0.4 MG/5ML IV SOLN
0.4000 mg | Freq: Once | INTRAVENOUS | Status: AC
  Administered 2016-06-01: 0.4 mg via INTRAVENOUS
  Filled 2016-05-31: qty 5

## 2016-05-31 MED ORDER — ONDANSETRON HCL 4 MG/2ML IJ SOLN
4.0000 mg | Freq: Four times a day (QID) | INTRAMUSCULAR | Status: DC | PRN
  Administered 2016-06-03: 4 mg via INTRAVENOUS
  Filled 2016-05-31: qty 2

## 2016-05-31 MED ORDER — ASPIRIN 81 MG PO CHEW
81.0000 mg | CHEWABLE_TABLET | Freq: Every day | ORAL | Status: DC
  Administered 2016-06-01 – 2016-06-03 (×3): 81 mg via ORAL
  Filled 2016-05-31 (×3): qty 1

## 2016-05-31 MED ORDER — SODIUM CHLORIDE 0.9 % IV SOLN
INTRAVENOUS | Status: AC
  Administered 2016-05-31: 21:00:00 via INTRAVENOUS

## 2016-05-31 MED ORDER — LEVOTHYROXINE SODIUM 50 MCG PO TABS
50.0000 ug | ORAL_TABLET | Freq: Every day | ORAL | Status: DC
  Administered 2016-06-01 – 2016-06-04 (×4): 50 ug via ORAL
  Filled 2016-05-31 (×4): qty 1

## 2016-05-31 MED ORDER — PANTOPRAZOLE SODIUM 40 MG PO TBEC
40.0000 mg | DELAYED_RELEASE_TABLET | Freq: Every day | ORAL | Status: DC
  Administered 2016-06-01 – 2016-06-04 (×4): 40 mg via ORAL
  Filled 2016-05-31 (×4): qty 1

## 2016-05-31 MED ORDER — NITROGLYCERIN 0.4 MG SL SUBL: 0.4000 mg | SUBLINGUAL_TABLET | SUBLINGUAL | Status: DC | PRN

## 2016-05-31 MED ORDER — MORPHINE SULFATE (PF) 4 MG/ML IV SOLN
4.0000 mg | INTRAVENOUS | Status: DC | PRN
  Administered 2016-05-31: 4 mg via INTRAVENOUS
  Filled 2016-05-31: qty 1

## 2016-05-31 MED ORDER — AMLODIPINE BESYLATE 5 MG PO TABS
5.0000 mg | ORAL_TABLET | Freq: Every day | ORAL | Status: DC
  Administered 2016-06-01 – 2016-06-04 (×4): 5 mg via ORAL
  Filled 2016-05-31 (×4): qty 1

## 2016-05-31 MED ORDER — SIMVASTATIN 20 MG PO TABS
20.0000 mg | ORAL_TABLET | Freq: Every evening | ORAL | Status: DC
  Administered 2016-05-31 – 2016-06-03 (×4): 20 mg via ORAL
  Filled 2016-05-31 (×4): qty 1

## 2016-05-31 MED ORDER — ROPINIROLE HCL 0.5 MG PO TABS
0.5000 mg | ORAL_TABLET | Freq: Every day | ORAL | Status: DC
  Administered 2016-05-31 – 2016-06-04 (×5): 0.5 mg via ORAL
  Filled 2016-05-31 (×5): qty 1

## 2016-05-31 MED ORDER — METOPROLOL TARTRATE 25 MG PO TABS
25.0000 mg | ORAL_TABLET | Freq: Two times a day (BID) | ORAL | Status: DC
  Administered 2016-05-31 – 2016-06-04 (×8): 25 mg via ORAL
  Filled 2016-05-31 (×8): qty 1

## 2016-05-31 NOTE — ED Provider Notes (Signed)
[facility] DEPT Provider Note   CSN: [id number] Arrival date & time: 05/31/16  1200     History   Chief Complaint Chief Complaint  Patient presents with  . Chest Pain    pt reports L arm heaviness and L sided jaw pain. also states SOB. history of cardiac stent. denies chest pain. respirations unlabored.     HPI Janice Glass is a 75 y.o. female.  HPI  Pt was seen at 1615.  Per pt, c/o gradual onset and worsening of persistent DOE for the past several weeks. Pt states she has been getting more SOB during her exercise class, and today she was SOB "just walking from the car to the door of the building." Pt also states now has been getting more SOB climbing a few flights of stairs. SOB improves with resting. Pt also states she has been having constant left sided jaw and arm "dull pain" for the past several weeks. Pt states these pains have been constant and unchanged for the past several weeks. Pt concerned that "before I got my last stent I had these pains." Denies CP/palpitations, no cough, no abd pain, no N/V/D, no fevers, no rash, no injury, no neck pain, no focal motor weakness, no tingling/numbness in extremities.    Past Medical History:  Diagnosis Date  . Anxiety   . CAD (coronary artery disease)    a, 11/2009 Abnl myoview - inferobasal ischemia;  b. 12/2009 PCI/DES to pLAD w/ 3x15 mm Promus DES;  c. Relook Cath 01/08/2010: patent stent, nonobs dzs. Relook cath 02/2012: patent LAD stent with mild disease  . Depression   . GERD (gastroesophageal reflux disease)   . HLD (hyperlipidemia)   . HTN (hypertension)   . Hypothyroidism   . Mild mitral regurgitation     Patient Active Problem List   Diagnosis Date Noted  . Pre-operative cardiovascular examination 09/01/2014  . Murmur 04/20/2012  . Unstable angina ([place]) 03/18/2012  . Dizziness 11/21/2011  . CAD (coronary artery disease)   . TIA 01/19/2010  . HYPOTHYROIDISM 01/02/2010  . Hyperlipidemia 01/02/2010  . ANXIETY  01/02/2010  . Essential hypertension 01/02/2010  . GERD 01/02/2010    Past Surgical History:  Procedure Laterality Date  . CARDIAC CATHETERIZATION  12/2009  . CESAREAN SECTION     several   . CORONARY ANGIOPLASTY WITH STENT PLACEMENT  12/2009   DES to proximal LAD  . CORONARY STENT PLACEMENT    . LEFT HEART CATHETERIZATION WITH CORONARY ANGIOGRAM N/A 03/20/2012   Procedure: LEFT HEART CATHETERIZATION WITH CORONARY ANGIOGRAM;  Surgeon: Hillary Bow, MD;  Location: [facility];  Service: Cardiovascular;  Laterality: N/A;       Home Medications    Prior to Admission medications   Medication Sig Start Date End Date Taking? Authorizing Provider  amLODipine (NORVASC) 5 MG tablet Take 5 mg by mouth daily.     Historical Provider, MD  aspirin 81 MG EC tablet Take 1 tablet (81 mg total) by mouth daily. 08/30/14   Wellington Hampshire, MD  cetirizine (ZYRTEC) 10 MG tablet Take 10 mg by mouth daily.    Historical Provider, MD  levothyroxine (SYNTHROID, LEVOTHROID) 50 MCG tablet Take 50 mcg by mouth daily.     Historical Provider, MD  metoprolol tartrate (LOPRESSOR) 25 MG tablet Take 1 tablet (25 mg total) by mouth 2 (two) times daily. 03/30/13   Wellington Hampshire, MD  nitroGLYCERIN (NITROSTAT) 0.4 MG SL tablet Place 1 tablet (0.4 mg total) under the  tongue every 5 (five) minutes as needed. For chest pain 03/19/12   Hillary Bow, MD  omeprazole (PRILOSEC) 40 MG capsule Take 1 tablet by mouth 2 (two) times daily.  05/07/11   Historical Provider, MD  OVER THE COUNTER MEDICATION Take 1 tablet by mouth daily. Occuvite    Historical Provider, MD  Probiotic Product (PROBIOTIC DAILY PO) Take 1 capsule by mouth daily.    Historical Provider, MD  rOPINIRole (REQUIP) 0.5 MG tablet Take 0.5 mg by mouth daily.    Historical Provider, MD  sertraline (ZOLOFT) 50 MG tablet Take 50 mg by mouth daily.    Historical Provider, MD  simvastatin (ZOCOR) 20 MG tablet Take 1 tablet (20 mg total) by mouth every evening.  10/23/12   Hillary Bow, MD  vitamin B-12 (CYANOCOBALAMIN) 1000 MCG tablet Take 1,000 mcg by mouth daily.    Historical Provider, MD    Family History Family History  Problem Relation Age of Onset  . Heart failure Mother     alive @ 20  . Other Father     Died @ young age in [place] History  Substance Use Topics  . Smoking status: Never Smoker  . Smokeless tobacco: Never Used     Comment: no tobacco   . Alcohol use No     Allergies   Codeine; Sulfa antibiotics; and Sulfonamide derivatives   Review of Systems Review of Systems ROS: Statement: All systems negative except as marked or noted in the HPI; Constitutional: Negative for fever and chills. ; ; Eyes: Negative for eye pain, redness and discharge. ; ; ENMT: Negative for ear pain, hoarseness, nasal congestion, sinus pressure and sore throat. ; ; Cardiovascular:   +SOB, DOE. Negative for chest pain, palpitations, diaphoresis, and peripheral edema. ; ; Respiratory: Negative for cough, wheezing and stridor. ; ; Gastrointestinal: Negative for nausea, vomiting, diarrhea, abdominal pain, blood in stool, hematemesis, jaundice and rectal bleeding. . ; ; Genitourinary: Negative for dysuria, flank pain and hematuria. ; ; Musculoskeletal: Negative for back pain and neck pain. Negative for swelling and trauma.; ; Skin: Negative for pruritus, rash, abrasions, blisters, bruising and skin lesion.; ; Neuro: Negative for headache, lightheadedness and neck stiffness. Negative for weakness, altered level of consciousness, altered mental status, extremity weakness, paresthesias, involuntary movement, seizure and syncope.       Physical Exam Updated Vital Signs BP 140/74   Pulse 71   Temp 98.7 F (37.1 C) (Oral)   Resp 19   Ht 5\' 3"  (1.6 m)   Wt 174 lb (78.9 kg)   SpO2 99%   BMI 30.82 kg/m   Physical Exam 1620: Physical examination:  Nursing notes reviewed; Vital signs and O2 SAT reviewed;  Constitutional: Well  developed, Well nourished, Well hydrated, In no acute distress; Head:  Normocephalic, atraumatic; Eyes: EOMI, PERRL, No scleral icterus; ENMT: Mouth and pharynx normal, Mucous membranes moist; Neck: Supple, Full range of motion, No lymphadenopathy; Cardiovascular: Regular rate and rhythm, No gallop; Respiratory: Breath sounds clear & equal bilaterally, No wheezes.  Speaking full sentences with ease, Normal respiratory effort/excursion; Chest: Nontender, Movement normal; Abdomen: Soft, Nontender, Nondistended, Normal bowel sounds; Genitourinary: No CVA tenderness;  Spine:  No midline CS, TS, LS tenderness. No rash.;;  Extremities: Pulses normal, No tenderness, No edema, No calf edema or asymmetry.; Neuro: AA&Ox3, Major CN grossly intact.  Speech clear. No gross focal motor or sensory deficits in extremities.; Skin: Color normal, Warm, Dry.; Psych:  Anxious.  ED Treatments / Results  Labs (all labs ordered are listed, but only abnormal results are displayed)   EKG  EKG Interpretation  Date/Time:  Friday May 31 2016 12:03:19 EDT Ventricular Rate:  68 PR Interval:  138 QRS Duration: 88 QT Interval:  414 QTC Calculation: 440 R Axis:   -6 Text Interpretation:  Normal sinus rhythm Moderate voltage criteria for LVH, may be normal variant Inferior infarct , age undetermined Anterior infarct , age undetermined Baseline wander Artifact Poor data quality in current ECG precludes serial comparison SUGGEST REPEAT TRACING Confirmed by [facility]  MD, Nunzio Cory [id number]) on 05/31/2016 4:35:20 PM        EKG Interpretation  Date/Time:  Friday May 31 2016 17:31:29 EDT Ventricular Rate:  76 PR Interval:  138 QRS Duration: 102 QT Interval:  417 QTC Calculation: 469 R Axis:   -2 Text Interpretation:  Sinus rhythm Probable left atrial enlargement Abnormal R-wave progression, early transition Left ventricular hypertrophy Inferior infarct, age indeterminate Artifact When compared with ECG of 05/27/2010  No significant change was found Confirmed by [facility]  MD, Nunzio Cory [phone]) on 05/31/2016 5:45:59 PM          Radiology   Procedures Procedures (including critical care time)  Medications Ordered in ED Medications  aspirin chewable tablet 324 mg (not administered)  nitroGLYCERIN (NITROSTAT) SL tablet 0.4 mg (not administered)  morphine 4 MG/ML injection 4 mg (not administered)     Initial Impression / Assessment and Plan / ED Course  I have reviewed the triage vital signs and the nursing notes.  Pertinent labs & imaging results that were available during my care of the patient were reviewed by me and considered in my medical decision making (see chart for details).  MDM Reviewed: previous chart, nursing note and vitals Reviewed previous: labs and ECG Interpretation: labs, ECG and x-ray   Results for orders placed or performed during the hospital encounter of 05/31/16  CBC  Result Value Ref Range   WBC 6.5 4.0 - 10.5 K/uL   RBC 3.99 3.87 - 5.11 MIL/uL   Hemoglobin 12.9 12.0 - 15.0 g/dL   HCT 40.1 36.0 - 46.0 %   MCV 100.5 (H) 78.0 - 100.0 fL   MCH 32.3 26.0 - 34.0 pg   MCHC 32.2 30.0 - 36.0 g/dL   RDW 12.6 11.5 - 15.5 %   Platelets 240 150 - 400 K/uL  Comprehensive metabolic panel  Result Value Ref Range   Sodium 140 135 - 145 mmol/L   Potassium 3.4 (L) 3.5 - 5.1 mmol/L   Chloride 106 101 - 111 mmol/L   CO2 26 22 - 32 mmol/L   Glucose, Bld 115 (H) 65 - 99 mg/dL   BUN 8 6 - 20 mg/dL   Creatinine, Ser 0.77 0.44 - 1.00 mg/dL   Calcium 9.2 8.9 - 10.3 mg/dL   Total Protein 6.2 (L) 6.5 - 8.1 g/dL   Albumin 3.8 3.5 - 5.0 g/dL   AST 22 15 - 41 U/L   ALT 13 (L) 14 - 54 U/L   Alkaline Phosphatase 69 38 - 126 U/L   Total Bilirubin 0.8 0.3 - 1.2 mg/dL   GFR calc non Af Amer >60 >60 mL/min   GFR calc Af Amer >60 >60 mL/min   Anion gap 8 5 - 15  I-stat troponin, ED (not at [facility], [facility])  Result Value Ref Range   Troponin i, poc 0.00 0.00 - 0.08 ng/mL   Comment 3  Dg Chest 2 View Result Date: 05/31/2016 CLINICAL DATA:  Left arm and jaw pain for 3 weeks.  Hypertension. EXAM: CHEST  2 VIEW COMPARISON:  01/23/2015 FINDINGS: Atherosclerotic calcification of the aortic arch. Mild lingular scarring. The lungs appear otherwise clear. Thoracic spondylosis. IMPRESSION: 1. Stable lingular scarring. 2.  Atherosclerotic calcification of the aortic arch. 3. No acute findings. Electronically Signed   By: Van Clines M.D.   On: 05/31/2016 13:10    1640:  Reassuring troponin. No acute STTW changes. Concerned regarding recurrent DOE as anginal equivalent.  T/C to Cards, case discussed, including:  HPI, pertinent PM/SHx, VS/PE, dx testing, ED course and treatment:  Agreeable to come to ED for evaluation.  1725:  T/C to Cards Dr. Ellyn Hack, case discussed, including:  HPI, pertinent PM/SHx, VS/PE, dx testing, ED course and treatment:  Agreeable to admit, requests to write temporary orders, obtain tele bed to Cards service.    Final Clinical Impressions(s) / ED Diagnoses   Final diagnoses:  None    New Prescriptions New Prescriptions   No medications on file     Francine Graven, DO 06/04/16 1645

## 2016-05-31 NOTE — ED Notes (Signed)
Attempted IV start rt pt unable to tolerate tourniquet.

## 2016-05-31 NOTE — H&P (Signed)
History & Physical    Patient ID: SHAQUONA CADY MRN: [id number], DOB/AGE: Oct 15, 1940   Admit date: 05/31/2016   Primary Physician: Gilford Rile, MD Primary Cardiologist: Jerilynn Mages. Fletcher Anon, MD   Patient Profile    75 y/o ? with a h/o CAD, HTN, HL, anxiety, depression, and hypothyroidism who presented to the ED today with a two week h/o left arm and jaw pain associated with DOE.  Past Medical History    Past Medical History:  Diagnosis Date  . Anxiety   . CAD (coronary artery disease)    a. 11/2009 Abnl myoview - inferobasal ischemia;  b. 12/2009 PCI/DES to pLAD w/ 3x15 mm Promus DES;  c. Relook Cath 01/08/2010: patent stent, nonobs dzs. Relook cath 02/2012: patent LAD stent with mild disease.  . Depression   . GERD (gastroesophageal reflux disease)   . HLD (hyperlipidemia)   . Hypertensive heart disease   . Hypothyroidism   . Mild Carotid Arterial Disease    a. 04/2015 Carotid U/S: 1-39% bilat ICA stenosis.  . Mild mitral regurgitation    a. 08/2015 Echo: Ef 55-60%, no rwma, mild MR.    Past Surgical History:  Procedure Laterality Date  . CARDIAC CATHETERIZATION  12/2009  . CESAREAN SECTION     several   . CORONARY ANGIOPLASTY WITH STENT PLACEMENT  12/2009   DES to proximal LAD  . CORONARY STENT PLACEMENT    . LEFT HEART CATHETERIZATION WITH CORONARY ANGIOGRAM N/A 03/20/2012   Procedure: LEFT HEART CATHETERIZATION WITH CORONARY ANGIOGRAM;  Surgeon: Hillary Bow, MD;  Location: [facility] CATH LAB;  Service: Cardiovascular;  Laterality: N/A;     Allergies  Allergies  Allergen Reactions  . Codeine Other (See Comments)    unknown  . Sulfa Antibiotics Other (See Comments)    Unknown reaction  . Sulfonamide Derivatives Other (See Comments)    unknown    History of Present Illness    75 y/o ? with a h/o CAD s/p LAD stenting in 2011 with subsequent caths in late 2011 and 2013 revealing nonobs dzs with patent LAD stent.  She also has a h/o HTN, HL, anxiety, depression,  hypothyroidism, and mild carotid dzs.  She lives in [place] with her husband, who is chronically ill s/p 6 strokes and requires adult day care.  She also lives with her 33 y/o mother who requires significant attention.  In that setting, Ms. Tivis has been under a lot of stress.  Beginning approx 2 wks, ago, she began to note constant, mild, dull, left upper arm pain and left jaw pain along with dyspnea on exertion.  The arm/jaw pain does not worsen with activity, position changes of involved joints, or palpation.  She notes that the jaw pain is similar to what she experienced prior to stent placement in 2011.  B/c of worsening dyspnea this AM, she presented to urgent care and then was referred to the ED.  Here, ECG is non-acute.  Troponin is normal.  She is very anxious.  She is currently complaining of severe arm pain with application of the tourniquet or any attempt at blood draws/IV insertion.  Home Medications    Prior to Admission medications   Medication Sig Start Date End Date Taking? Authorizing Provider  amLODipine (NORVASC) 5 MG tablet Take 5 mg by mouth daily.     Historical Provider, MD  aspirin 81 MG EC tablet Take 1 tablet (81 mg total) by mouth daily. 08/30/14   Wellington Hampshire, MD  cetirizine (ZYRTEC) 10  MG tablet Take 10 mg by mouth daily.    Historical Provider, MD  levothyroxine (SYNTHROID, LEVOTHROID) 50 MCG tablet Take 50 mcg by mouth daily.     Historical Provider, MD  metoprolol tartrate (LOPRESSOR) 25 MG tablet Take 1 tablet (25 mg total) by mouth 2 (two) times daily. 03/30/13   Wellington Hampshire, MD  nitroGLYCERIN (NITROSTAT) 0.4 MG SL tablet Place 1 tablet (0.4 mg total) under the tongue every 5 (five) minutes as needed. For chest pain 03/19/12   Hillary Bow, MD  omeprazole (PRILOSEC) 40 MG capsule Take 1 tablet by mouth 2 (two) times daily.  05/07/11   Historical Provider, MD  OVER THE COUNTER MEDICATION Take 1 tablet by mouth daily. Occuvite    Historical Provider, MD    Probiotic Product (PROBIOTIC DAILY PO) Take 1 capsule by mouth daily.    Historical Provider, MD  rOPINIRole (REQUIP) 0.5 MG tablet Take 0.5 mg by mouth daily.    Historical Provider, MD  sertraline (ZOLOFT) 50 MG tablet Take 50 mg by mouth daily.    Historical Provider, MD  simvastatin (ZOCOR) 20 MG tablet Take 1 tablet (20 mg total) by mouth every evening. 10/23/12   Hillary Bow, MD  vitamin B-12 (CYANOCOBALAMIN) 1000 MCG tablet Take 1,000 mcg by mouth daily.    Historical Provider, MD   Family History    Family History  Problem Relation Age of Onset  . Heart failure Mother     alive @ 15  . Other Father     Died @ young age in [place] History  . Marital status: Married    Spouse name: N/A  . Number of children: N/A  . Years of education: N/A   Occupational History  . Not on file.   Social History Main Topics  . Smoking status: Never Smoker  . Smokeless tobacco: Never Used     Comment: no tobacco   . Alcohol use No  . Drug use: No  . Sexual activity: Not on file   Other Topics Concern  . Not on file   Social History Narrative   Lives in [place] with Husband who has had 5 CVA's and she is the primary care giver.  She does not routinely exercise but is very active @ home.     Review of Systems    General:  No chills, fever, night sweats or weight changes.  Cardiovascular:  +++ chest pain, +++ dyspnea on exertion, no edema, orthopnea, palpitations, paroxysmal nocturnal dyspnea. Dermatological: No rash, lesions/masses Respiratory: No cough, +++ dyspnea Urologic: No hematuria, dysuria Abdominal:   No nausea, vomiting, diarrhea, bright red blood per rectum, melena, or hematemesis Neurologic:  No visual changes, wkns, changes in mental status. All other systems reviewed and are otherwise negative except as noted above.  Physical Exam    Blood pressure 156/77, pulse 80, temperature 98.7 F (37.1 C), temperature source  Oral, resp. rate 19, height 5\' 3"  (1.6 m), weight 174 lb (78.9 kg), SpO2 100 %.  General: Pleasant, NAD Psych: Normal affect. Neuro: Alert and oriented X 3. Moves all extremities spontaneously. HEENT: Normal  Neck: Supple without bruits or JVD. Lungs:  Resp regular and unlabored, CTA. Heart: RRR no s3, s4, or murmurs. Abdomen: Soft, non-tender, non-distended, BS + x 4.  Extremities: No clubbing, cyanosis or edema. DP/PT/Radials 2+ and equal bilaterally.  Labs    Troponin [facility] Test)  Recent  Labs  05/31/16 1236  TROPIPOC 0.00   Lab Results  Component Value Date   WBC 6.5 05/31/2016   HGB 12.9 05/31/2016   HCT 40.1 05/31/2016   MCV 100.5 (H) 05/31/2016   PLT 240 05/31/2016    Recent Labs Lab 05/31/16 1200  NA 140  K 3.4*  CL 106  CO2 26  BUN 8  CREATININE 0.77  CALCIUM 9.2  PROT 6.2*  BILITOT 0.8  ALKPHOS 69  ALT 13*  AST 22  GLUCOSE 115*   Radiology Studies    Dg Chest 2 View  Result Date: 05/31/2016 CLINICAL DATA:  Left arm and jaw pain for 3 weeks.  Hypertension. EXAM: CHEST  2 VIEW COMPARISON:  01/23/2015 FINDINGS: Atherosclerotic calcification of the aortic arch. Mild lingular scarring. The lungs appear otherwise clear. Thoracic spondylosis. IMPRESSION: 1. Stable lingular scarring. 2.  Atherosclerotic calcification of the aortic arch. 3. No acute findings. Electronically Signed   By: Van Clines M.D.   On: 05/31/2016 13:10    ECG & Cardiac Imaging    RSR, 68, inf infarct, LVH.  Assessment & Plan    1.  Left arm pain/Dyspnea:  Pt presents with a two week h/o constant left arm and left jaw pain with DOE.  She says that jaw pain is similar to prior anginal equivalent.  Despite prolonged symptoms, initial troponin is normal.  ECG non-acute.  We will observe tonight.  Cycle CE.  Check D dimer.  If trop remains nl, will plan on lexiscan MV in the AM.  Cont asa,  blocker, statin.  2.  CAD:  See #1.  3.  HTN:  Cont home meds.  Follow.  4.   HL:  Cont statin.  5.  Anxiety:  She has been under a lot of stress.  ? Role in Ss.  PRN xanax.  Signed, Murray Hodgkins, NP 05/31/2016, 5:15 PM  I have seen, examined and evaluated the patient this PM along with Mr. Sharolyn Douglas, NP.  After reviewing all the available data and chart, we discussed the patients laboratory, study & physical findings as well as symptoms in detail. I agree with his findings, examination as well as impression recommendations as per our discussion.    Very anxious 75 year old woman with a known history of coronary disease status post LAD PCI who presents with close to 2 week history of prolonged left-sided arm and jaw pain as well as exertional dyspnea. She is hypersensitive to any discomfort right now. She was intolerant of even having the IV tourniquet on.  Her jaw pain has been consistent for 2 weeks and the arm pain is low but more recent, it is mostly along the right upper arm. It really isn't associated with anything in particular. She doesn't indicate that is worsened by exertion. What she does note however is ataxic significant exertional dyspnea to the point where she is not able do her routine exercises. Probably more concerning for me is the exertional dyspnea than that jaw and arm pain.  I would suspect that if her enzymes are, currently negative that she is ruled out for MI, but cannot exclude progressively worsening anginal dyspnea.  I agree with recommendation to place on observation and plan nuclear stress test tomorrow. I also agree that we need to treat her anxiety. I think this is playing a major role in her symptoms. For now when necessary Xanax, but she probably benefit from a higher dose of SSRI.  As she does not seem to be having  unstable angina ACS-type symptoms, I think are okay without anticoagulating for now. She is on aspirin plus amlodipine, beta blocker and simvastatin.    Glenetta Hew, M.D., M.S. Interventional Cardiologist   Pager #  [phone] Phone # [phone] [address] [place], [place] [address]

## 2016-05-31 NOTE — Plan of Care (Signed)
Problem: Phase I Progression Outcomes Goal: Aspirin unless contraindicated Outcome: Progressing Aspirin given in the ED.   Problem: Phase II Progression Outcomes Goal: Anginal pain relieved No c/o chest pain at this time. Goal: Stress Test if indicated Outcome: Progressing Stress test planned for 9/9. Pt has been educated and willing to sign consent form. Pt has verbally agreed to be NPO after midnight for stress test.

## 2016-05-31 NOTE — ED Notes (Signed)
Pt eating dinner will transport upstairs when she is finished.

## 2016-05-31 NOTE — ED Notes (Signed)
Attempted report to 3 W. 

## 2016-06-01 ENCOUNTER — Observation Stay (HOSPITAL_COMMUNITY): Payer: Medicare HMO

## 2016-06-01 ENCOUNTER — Observation Stay (HOSPITAL_BASED_OUTPATIENT_CLINIC_OR_DEPARTMENT_OTHER): Payer: Medicare HMO

## 2016-06-01 DIAGNOSIS — I2583 Coronary atherosclerosis due to lipid rich plaque: Secondary | ICD-10-CM | POA: Diagnosis not present

## 2016-06-01 DIAGNOSIS — R0789 Other chest pain: Secondary | ICD-10-CM | POA: Diagnosis not present

## 2016-06-01 DIAGNOSIS — I25111 Atherosclerotic heart disease of native coronary artery with angina pectoris with documented spasm: Secondary | ICD-10-CM | POA: Diagnosis not present

## 2016-06-01 DIAGNOSIS — R0609 Other forms of dyspnea: Secondary | ICD-10-CM | POA: Diagnosis not present

## 2016-06-01 DIAGNOSIS — I2584 Coronary atherosclerosis due to calcified coronary lesion: Secondary | ICD-10-CM | POA: Diagnosis not present

## 2016-06-01 DIAGNOSIS — E039 Hypothyroidism, unspecified: Secondary | ICD-10-CM | POA: Diagnosis not present

## 2016-06-01 DIAGNOSIS — R079 Chest pain, unspecified: Secondary | ICD-10-CM | POA: Diagnosis not present

## 2016-06-01 DIAGNOSIS — R072 Precordial pain: Secondary | ICD-10-CM | POA: Diagnosis not present

## 2016-06-01 DIAGNOSIS — I2510 Atherosclerotic heart disease of native coronary artery without angina pectoris: Secondary | ICD-10-CM | POA: Diagnosis not present

## 2016-06-01 LAB — BASIC METABOLIC PANEL
Anion gap: 8 (ref 5–15)
BUN: 8 mg/dL (ref 6–20)
CHLORIDE: 105 mmol/L (ref 101–111)
CO2: 26 mmol/L (ref 22–32)
CREATININE: 0.91 mg/dL (ref 0.44–1.00)
Calcium: 9 mg/dL (ref 8.9–10.3)
GFR calc Af Amer: >60 mL/min (ref >60)
GFR calc non Af Amer: >60 mL/min (ref >60)
GLUCOSE: 101 mg/dL — AB (ref 65–99)
POTASSIUM: 3.5 mmol/L (ref 3.5–5.1)
Sodium: 139 mmol/L (ref 135–145)

## 2016-06-01 LAB — TROPONIN I
Troponin I: <0.03 ng/mL (ref <0.03)
Troponin I: <0.03 ng/mL (ref <0.03)

## 2016-06-01 LAB — LIPID PANEL
CHOL/HDL RATIO: 3.1 ratio
Cholesterol: 168 mg/dL (ref 0–200)
HDL: 55 mg/dL (ref >40)
LDL CALC: 84 mg/dL (ref 0–99)
Triglycerides: 145 mg/dL (ref <150)
VLDL: 29 mg/dL (ref 0–40)

## 2016-06-01 MED ORDER — TECHNETIUM TC 99M TETROFOSMIN IV KIT
30.0000 | PACK | Freq: Once | INTRAVENOUS | Status: AC | PRN
  Administered 2016-06-01: 30 via INTRAVENOUS

## 2016-06-01 MED ORDER — GI COCKTAIL ~~LOC~~
30.0000 mL | Freq: Three times a day (TID) | ORAL | Status: DC | PRN
Start: 2016-06-01 — End: 2016-06-04
  Administered 2016-06-01: 30 mL via ORAL
  Filled 2016-06-01: qty 30

## 2016-06-01 MED ORDER — ALUM & MAG HYDROXIDE-SIMETH 200-200-20 MG/5ML PO SUSP
30.0000 mL | ORAL | Status: DC | PRN
  Administered 2016-06-02 – 2016-06-03 (×2): 30 mL via ORAL
  Filled 2016-06-01 (×2): qty 30

## 2016-06-01 MED ORDER — REGADENOSON 0.4 MG/5ML IV SOLN
INTRAVENOUS | Status: AC
  Filled 2016-06-01: qty 5

## 2016-06-01 MED ORDER — TECHNETIUM TC 99M TETROFOSMIN IV KIT
10.0000 | PACK | Freq: Once | INTRAVENOUS | Status: AC | PRN
  Administered 2016-06-01: 10 via INTRAVENOUS

## 2016-06-01 NOTE — Progress Notes (Addendum)
PROGRESS NOTE  Subjective:    75 y/o ? with a h/o CAD, HTN, HL, anxiety, depression, and hypothyroidism who presented to the ED today with a two week h/o left arm and jaw pain associated with DOE.  Her jaw pain is very similar to her presenting pain when she presented with true angina and received a stent.    Objective:    Vital Signs:   Temp:  [97.9 F (36.6 C)-98.7 F (37.1 C)] 97.9 F (36.6 C) (09/09 0500) Pulse Rate:  [66-80] 67 (09/09 0500) Resp:  [14-24] 16 (09/09 0500) BP: (132-178)/(68-89) 169/75 (09/09 1053) SpO2:  [97 %-100 %] 98 % (09/09 0500) Weight:  [174 lb 1.6 oz (79 kg)] 174 lb 1.6 oz (79 kg) (09/09 0500)  Last BM Date: 05/31/16   24-hour weight change: Weight change:   Weight trends: Filed Weights   05/31/16 1204 06/01/16 0500  Weight: 174 lb (78.9 kg) 174 lb 1.6 oz (79 kg)    Intake/Output:  09/08 0701 - 09/09 0700 In: 888.3 [P.O.:240; I.V.:648.3] Out: -  No intake/output data recorded.   Physical Exam: BP (!) 169/75   Pulse 67   Temp 97.9 F (36.6 C) (Oral)   Resp 16   Ht 5\' 3"  (1.6 m)   Wt 174 lb 1.6 oz (79 kg)   SpO2 98%   BMI 30.84 kg/m   Wt Readings from Last 3 Encounters:  06/01/16 174 lb 1.6 oz (79 kg)  09/05/15 156 lb (70.8 kg)  08/30/14 161 lb 12.8 oz (73.4 kg)    General: Vital signs reviewed and noted.   Head: Normocephalic, atraumatic.  Eyes: conjunctivae/corneas clear.  EOM's intact.   Throat: normal  Neck:  normal  Lungs:    clear  Heart:  RR  Abdomen:  Soft, non-tender, non-distended    Extremities: No edema    Neurologic: A&O X3, CN II - XII are grossly intact.   Psych: Normal     Labs: BMET:  Recent Labs  05/31/16 1200 05/31/16 2028  NA 140  --   K 3.4*  --   CL 106  --   CO2 26  --   GLUCOSE 115*  --   BUN 8  --   CREATININE 0.77 0.90  CALCIUM 9.2  --     Liver function tests:  Recent Labs  05/31/16 1200  AST 22  ALT 13*  ALKPHOS 69  BILITOT 0.8  PROT 6.2*  ALBUMIN 3.8    No results for input(s): LIPASE, AMYLASE in the last 72 hours.  CBC:  Recent Labs  05/31/16 1200 05/31/16 2028  WBC 6.5 8.0  HGB 12.9 12.9  HCT 40.1 40.1  MCV 100.5* 101.0*  PLT 240 245    Cardiac Enzymes:  Recent Labs  05/31/16 2028 06/01/16 0333  TROPONINI <0.03 <0.03    Coagulation Studies: No results for input(s): LABPROT, INR in the last 72 hours.  Other: Invalid input(s): POCBNP  Recent Labs  05/31/16 2028  DDIMER 0.31   No results for input(s): HGBA1C in the last 72 hours.  Recent Labs  06/01/16 0333  CHOL 168  HDL 55  LDLCALC 84  TRIG 145  CHOLHDL 3.1    Recent Labs  05/31/16 2028  TSH 4.323   No results for input(s): VITAMINB12, FOLATE, FERRITIN, TIBC, IRON, RETICCTPCT in the last 72 hours.   Other results:  teke -  ( personally reviewed )    NSR ,    Medications:  Infusions:    Scheduled Medications: . regadenoson      . amLODipine  5 mg Oral Daily  . aspirin  81 mg Oral Daily  . heparin  5,000 Units Subcutaneous Q8H  . levothyroxine  50 mcg Oral QAC breakfast  . metoprolol tartrate  25 mg Oral BID  . pantoprazole  40 mg Oral Daily  . rOPINIRole  0.5 mg Oral Daily  . sertraline  50 mg Oral Daily  . simvastatin  20 mg Oral QPM    Assessment/ Plan:   Active Problems:   DOE (dyspnea on exertion)   Chest pain  1. Jaw pain :  Similar to her presenting symptoms when she was stented.  Troponin levels are all negative. myoview images are normal I have personally reviewed the myoview images.   Pt insists that she is still very short of breath Does not feel comfortable going home Will keep tonight Ambulate She may need cath on Monday if she does not feel any better.   She is anxious - I cannot rule out anxiety playing at least some role in her symptoms  total time spent - 45 minutes.    Disposition:  Length of Stay: 0  Thayer Headings, Brooke Bonito., MD, [facility] 06/01/2016, 12:41 PM Office [phone] Pager  [phone]

## 2016-06-01 NOTE — Progress Notes (Signed)
Myoview done, final report pending.  Kerin Ransom PA-C 06/01/2016 10:48 AM

## 2016-06-02 DIAGNOSIS — I2584 Coronary atherosclerosis due to calcified coronary lesion: Secondary | ICD-10-CM

## 2016-06-02 DIAGNOSIS — R072 Precordial pain: Secondary | ICD-10-CM | POA: Diagnosis not present

## 2016-06-02 DIAGNOSIS — R0609 Other forms of dyspnea: Secondary | ICD-10-CM | POA: Diagnosis not present

## 2016-06-02 DIAGNOSIS — I2583 Coronary atherosclerosis due to lipid rich plaque: Secondary | ICD-10-CM | POA: Diagnosis not present

## 2016-06-02 DIAGNOSIS — I2510 Atherosclerotic heart disease of native coronary artery without angina pectoris: Secondary | ICD-10-CM | POA: Diagnosis not present

## 2016-06-02 DIAGNOSIS — E039 Hypothyroidism, unspecified: Secondary | ICD-10-CM | POA: Diagnosis not present

## 2016-06-02 NOTE — Progress Notes (Signed)
PROGRESS NOTE  Subjective:    75 y/o ? with a h/o CAD, HTN, HL, anxiety, depression, and hypothyroidism who presented to the ED today with a two week h/o left arm and jaw pain associated with DOE.  Her jaw pain is very similar to her presenting pain when she presented with true angina and received a stent.    Objective:    Vital Signs:   Temp:  [97.7 F (36.5 C)-98.1 F (36.7 C)] 97.8 F (36.6 C) (09/10 0443) Pulse Rate:  [62-72] 62 (09/10 0443) Resp:  [16-20] 16 (09/10 0443) BP: (155-163)/(77-83) 163/77 (09/10 0443) SpO2:  [96 %-98 %] 98 % (09/10 0443) Weight:  [173 lb (78.5 kg)] 173 lb (78.5 kg) (09/10 0443)  Last BM Date: 05/31/16   24-hour weight change: Weight change: -1 lb (-0.454 kg)  Weight trends: Filed Weights   05/31/16 1204 06/01/16 0500 06/02/16 0443  Weight: 174 lb (78.9 kg) 174 lb 1.6 oz (79 kg) 173 lb (78.5 kg)    Intake/Output:  No intake/output data recorded. Total I/O In: 240 [P.O.:240] Out: -    Physical Exam: BP (!) 163/77 (BP Location: Right Arm)   Pulse 62   Temp 97.8 F (36.6 C) (Oral)   Resp 16   Ht 5\' 3"  (1.6 m)   Wt 173 lb (78.5 kg)   SpO2 98%   BMI 30.65 kg/m   Wt Readings from Last 3 Encounters:  06/02/16 173 lb (78.5 kg)  09/05/15 156 lb (70.8 kg)  08/30/14 161 lb 12.8 oz (73.4 kg)    General: Vital signs reviewed and noted.   Head: Normocephalic, atraumatic.  Eyes: conjunctivae/corneas clear.  EOM's intact.   Throat: normal  Neck:  normal  Lungs:    clear  Heart:  RR  Abdomen:  Soft, non-tender, non-distended    Extremities: No edema    Neurologic: A&O X3, CN II - XII are grossly intact.   Psych: Normal     Labs: BMET:  Recent Labs  05/31/16 1200 05/31/16 2028 06/01/16 1504  NA 140  --  139  K 3.4*  --  3.5  CL 106  --  105  CO2 26  --  26  GLUCOSE 115*  --  101*  BUN 8  --  8  CREATININE 0.77 0.90 0.91  CALCIUM 9.2  --  9.0    Liver function tests:  Recent Labs  05/31/16 1200  AST  22  ALT 13*  ALKPHOS 69  BILITOT 0.8  PROT 6.2*  ALBUMIN 3.8   No results for input(s): LIPASE, AMYLASE in the last 72 hours.  CBC:  Recent Labs  05/31/16 1200 05/31/16 2028  WBC 6.5 8.0  HGB 12.9 12.9  HCT 40.1 40.1  MCV 100.5* 101.0*  PLT 240 245    Cardiac Enzymes:  Recent Labs  05/31/16 2028 06/01/16 0333 06/01/16 1504  TROPONINI <0.03 <0.03 <0.03    Coagulation Studies: No results for input(s): LABPROT, INR in the last 72 hours.  Other: Invalid input(s): POCBNP  Recent Labs  05/31/16 2028  DDIMER 0.31   No results for input(s): HGBA1C in the last 72 hours.  Recent Labs  06/01/16 0333  CHOL 168  HDL 55  LDLCALC 84  TRIG 145  CHOLHDL 3.1    Recent Labs  05/31/16 2028  TSH 4.323   No results for input(s): VITAMINB12, FOLATE, FERRITIN, TIBC, IRON, RETICCTPCT in the last 72 hours.   Other results:  teke -  (  personally reviewed )    NSR ,    Medications:    Infusions:    Scheduled Medications: . amLODipine  5 mg Oral Daily  . aspirin  81 mg Oral Daily  . heparin  5,000 Units Subcutaneous Q8H  . levothyroxine  50 mcg Oral QAC breakfast  . metoprolol tartrate  25 mg Oral BID  . pantoprazole  40 mg Oral Daily  . rOPINIRole  0.5 mg Oral Daily  . sertraline  50 mg Oral Daily  . simvastatin  20 mg Oral QPM    Assessment/ Plan:   Active Problems:   DOE (dyspnea on exertion)   Chest pain  1. Jaw pain :  Similar to her presenting symptoms when she was stented.  Troponin levels are all negative. myoview images are normal I have personally reviewed the myoview images.   Pt insists that she is still very short of breath She had worsening jaw pain with walking  Does not feel comfortable going home Will schedule for cath tomorrow  discussed risks, benefits, options. She understands and agrees to proceed.    She is anxious - I cannot rule out anxiety playing at least some role in her symptoms  total time spent - 30  minutes.    Disposition:  Length of Stay: 0  Thayer Headings, Brooke Bonito., MD, [facility] 06/02/2016, 11:45 AM Office [phone] Pager [phone]

## 2016-06-03 ENCOUNTER — Encounter (HOSPITAL_COMMUNITY)
Admission: EM | Disposition: A | Discharge status: Home / Self Care | Payer: Self-pay | Source: Home / Self Care | Attending: Emergency Medicine

## 2016-06-03 DIAGNOSIS — I2510 Atherosclerotic heart disease of native coronary artery without angina pectoris: Secondary | ICD-10-CM | POA: Diagnosis not present

## 2016-06-03 DIAGNOSIS — I2583 Coronary atherosclerosis due to lipid rich plaque: Secondary | ICD-10-CM

## 2016-06-03 HISTORY — PX: CARDIAC CATHETERIZATION: SHX172

## 2016-06-03 LAB — PROTIME-INR
INR: 0.96
Prothrombin Time: 12.7 seconds (ref 11.4–15.2)

## 2016-06-03 SURGERY — LEFT HEART CATH AND CORONARY ANGIOGRAPHY

## 2016-06-03 MED ORDER — HEPARIN SODIUM (PORCINE) 1000 UNIT/ML IJ SOLN
INTRAMUSCULAR | Status: DC | PRN
  Administered 2016-06-03: 4000 [IU] via INTRAVENOUS

## 2016-06-03 MED ORDER — MIDAZOLAM HCL 2 MG/2ML IJ SOLN
INTRAMUSCULAR | Status: AC
  Filled 2016-06-03: qty 2

## 2016-06-03 MED ORDER — SODIUM CHLORIDE 0.9% FLUSH
3.0000 mL | Freq: Two times a day (BID) | INTRAVENOUS | Status: DC
  Administered 2016-06-03 – 2016-06-04 (×2): 3 mL via INTRAVENOUS

## 2016-06-03 MED ORDER — SODIUM CHLORIDE 0.9 % WEIGHT BASED INFUSION
3.0000 mL/kg/h | INTRAVENOUS | Status: AC
  Administered 2016-06-03: 3 mL/kg/h via INTRAVENOUS

## 2016-06-03 MED ORDER — SODIUM CHLORIDE 0.9% FLUSH: 3.0000 mL | Freq: Two times a day (BID) | INTRAVENOUS | Status: DC

## 2016-06-03 MED ORDER — HEPARIN (PORCINE) IN NACL 2-0.9 UNIT/ML-% IJ SOLN
INTRAMUSCULAR | Status: DC | PRN
  Administered 2016-06-03: 1000 mL via INTRA_ARTERIAL

## 2016-06-03 MED ORDER — VERAPAMIL HCL 2.5 MG/ML IV SOLN
INTRA_ARTERIAL | Status: DC | PRN
  Administered 2016-06-03: 5 mL via INTRA_ARTERIAL

## 2016-06-03 MED ORDER — LIDOCAINE HCL (PF) 1 % IJ SOLN
INTRAMUSCULAR | Status: DC | PRN
  Administered 2016-06-03: 2 mL via INTRADERMAL

## 2016-06-03 MED ORDER — NITROGLYCERIN 1 MG/10 ML FOR IR/CATH LAB
INTRA_ARTERIAL | Status: AC
  Filled 2016-06-03: qty 10

## 2016-06-03 MED ORDER — SODIUM CHLORIDE 0.9 % WEIGHT BASED INFUSION: 1.0000 mL/kg/h | INTRAVENOUS | Status: DC

## 2016-06-03 MED ORDER — SODIUM CHLORIDE 0.9% FLUSH: 3.0000 mL | INTRAVENOUS | Status: DC | PRN

## 2016-06-03 MED ORDER — VERAPAMIL HCL 2.5 MG/ML IV SOLN
INTRAVENOUS | Status: AC
  Filled 2016-06-03: qty 2

## 2016-06-03 MED ORDER — MORPHINE SULFATE (PF) 2 MG/ML IV SOLN
2.0000 mg | Freq: Once | INTRAVENOUS | Status: AC
  Administered 2016-06-03: 2 mg via INTRAVENOUS
  Filled 2016-06-03: qty 1

## 2016-06-03 MED ORDER — IOPAMIDOL (ISOVUE-370) INJECTION 76%
INTRAVENOUS | Status: AC
  Filled 2016-06-03: qty 100

## 2016-06-03 MED ORDER — HEPARIN SODIUM (PORCINE) 1000 UNIT/ML IJ SOLN
INTRAMUSCULAR | Status: AC
  Filled 2016-06-03: qty 1

## 2016-06-03 MED ORDER — FENTANYL CITRATE (PF) 100 MCG/2ML IJ SOLN
INTRAMUSCULAR | Status: AC
  Filled 2016-06-03: qty 2

## 2016-06-03 MED ORDER — ASPIRIN 81 MG PO CHEW
81.0000 mg | CHEWABLE_TABLET | Freq: Every day | ORAL | Status: DC
  Administered 2016-06-04: 81 mg via ORAL
  Filled 2016-06-03: qty 1

## 2016-06-03 MED ORDER — FENTANYL CITRATE (PF) 100 MCG/2ML IJ SOLN
INTRAMUSCULAR | Status: DC | PRN
  Administered 2016-06-03: 25 ug via INTRAVENOUS

## 2016-06-03 MED ORDER — ONDANSETRON HCL 4 MG/2ML IJ SOLN: 4.0000 mg | Freq: Four times a day (QID) | INTRAMUSCULAR | Status: DC | PRN

## 2016-06-03 MED ORDER — SODIUM CHLORIDE 0.9 % IV SOLN: 250.0000 mL | INTRAVENOUS | Status: DC | PRN

## 2016-06-03 MED ORDER — SODIUM CHLORIDE 0.9 % WEIGHT BASED INFUSION
3.0000 mL/kg/h | INTRAVENOUS | Status: DC
  Administered 2016-06-03: 3 mL/kg/h via INTRAVENOUS

## 2016-06-03 MED ORDER — ACETAMINOPHEN 325 MG PO TABS
650.0000 mg | ORAL_TABLET | ORAL | Status: DC | PRN
  Administered 2016-06-04: 650 mg via ORAL
  Filled 2016-06-03: qty 2

## 2016-06-03 MED ORDER — MIDAZOLAM HCL 2 MG/2ML IJ SOLN
INTRAMUSCULAR | Status: DC | PRN
  Administered 2016-06-03: 1 mg via INTRAVENOUS

## 2016-06-03 MED ORDER — HEPARIN (PORCINE) IN NACL 2-0.9 UNIT/ML-% IJ SOLN
INTRAMUSCULAR | Status: AC
  Filled 2016-06-03: qty 1000

## 2016-06-03 MED ORDER — LIDOCAINE HCL (PF) 1 % IJ SOLN
INTRAMUSCULAR | Status: AC
  Filled 2016-06-03: qty 30

## 2016-06-03 MED ORDER — IOPAMIDOL (ISOVUE-370) INJECTION 76%
INTRAVENOUS | Status: DC | PRN
  Administered 2016-06-03: 70 mL via INTRA_ARTERIAL

## 2016-06-03 SURGICAL SUPPLY — 11 items
CATH INFINITI 5FR ANG PIGTAIL (CATHETERS) ×2 IMPLANT
CATH OPTITORQUE TIG 4.0 5F (CATHETERS) ×2 IMPLANT
DEVICE RAD COMP TR BAND LRG (VASCULAR PRODUCTS) ×2 IMPLANT
GLIDESHEATH SLEND A-KIT 6F 22G (SHEATH) ×3 IMPLANT
KIT HEART LEFT (KITS) ×3 IMPLANT
PACK CARDIAC CATHETERIZATION (CUSTOM PROCEDURE TRAY) ×3 IMPLANT
SYR MEDRAD MARK V 150ML (SYRINGE) ×3 IMPLANT
TRANSDUCER W/STOPCOCK (MISCELLANEOUS) ×3 IMPLANT
TUBING CIL FLEX 10 FLL-RA (TUBING) ×3 IMPLANT
WIRE EMERALD 3MM-J .035X260CM (WIRE) ×2 IMPLANT
WIRE HI TORQ VERSACORE-J 145CM (WIRE) ×2 IMPLANT

## 2016-06-03 NOTE — H&P (View-Only) (Signed)
PROGRESS NOTE  Subjective:    75 y/o ? with a h/o CAD, HTN, HL, anxiety, depression, and hypothyroidism who presented to the ED today with a two week h/o left arm and jaw pain associated with DOE.  Her jaw pain is very similar to her presenting pain when she presented with true angina and received a stent.    Objective:    Vital Signs:   Temp:  [97.5 F (36.4 C)-98 F (36.7 C)] 98 F (36.7 C) (09/11 0407) Pulse Rate:  [66-79] 66 (09/11 0407) Resp:  [15-21] 15 (09/11 0407) BP: (127-161)/(70-89) 145/70 (09/11 0407) SpO2:  [98 %-100 %] 100 % (09/11 0407) Weight:  [173 lb 9.6 oz (78.7 kg)] 173 lb 9.6 oz (78.7 kg) (09/11 0407)  Last BM Date: 06/02/16   24-hour weight change: Weight change: 9.6 oz (0.272 kg)  Weight trends: Filed Weights   06/01/16 0500 06/02/16 0443 06/03/16 0407  Weight: 174 lb 1.6 oz (79 kg) 173 lb (78.5 kg) 173 lb 9.6 oz (78.7 kg)    Intake/Output:  09/10 0701 - 09/11 0700 In: 840 [P.O.:840] Out: -  No intake/output data recorded.   Physical Exam: BP (!) 145/70 (BP Location: Right Arm)   Pulse 66   Temp 98 F (36.7 C) (Oral)   Resp 15   Ht 5\' 3"  (1.6 m)   Wt 173 lb 9.6 oz (78.7 kg)   SpO2 100%   BMI 30.75 kg/m   Wt Readings from Last 3 Encounters:  06/03/16 173 lb 9.6 oz (78.7 kg)  09/05/15 156 lb (70.8 kg)  08/30/14 161 lb 12.8 oz (73.4 kg)    General: Vital signs reviewed and noted.   Head: Normocephalic, atraumatic.  Eyes: conjunctivae/corneas clear.  EOM's intact.   Throat: normal  Neck:  normal  Lungs:    clear  Heart:  RR  Abdomen:  Soft, non-tender, non-distended    Extremities: No edema    Neurologic: A&O X3, CN II - XII are grossly intact.   Psych: Normal     Labs: BMET:  Recent Labs  05/31/16 1200 05/31/16 2028 06/01/16 1504  NA 140  --  139  K 3.4*  --  3.5  CL 106  --  105  CO2 26  --  26  GLUCOSE 115*  --  101*  BUN 8  --  8  CREATININE 0.77 0.90 0.91  CALCIUM 9.2  --  9.0    Liver function  tests:  Recent Labs  05/31/16 1200  AST 22  ALT 13*  ALKPHOS 69  BILITOT 0.8  PROT 6.2*  ALBUMIN 3.8   No results for input(s): LIPASE, AMYLASE in the last 72 hours.  CBC:  Recent Labs  05/31/16 1200 05/31/16 2028  WBC 6.5 8.0  HGB 12.9 12.9  HCT 40.1 40.1  MCV 100.5* 101.0*  PLT 240 245    Cardiac Enzymes:  Recent Labs  05/31/16 2028 06/01/16 0333 06/01/16 1504  TROPONINI <0.03 <0.03 <0.03    Coagulation Studies: No results for input(s): LABPROT, INR in the last 72 hours.  Other: Invalid input(s): POCBNP  Recent Labs  05/31/16 2028  DDIMER 0.31   No results for input(s): HGBA1C in the last 72 hours.  Recent Labs  06/01/16 0333  CHOL 168  HDL 55  LDLCALC 84  TRIG 145  CHOLHDL 3.1    Recent Labs  05/31/16 2028  TSH 4.323   No results for input(s): VITAMINB12, FOLATE, FERRITIN, TIBC, IRON, RETICCTPCT  in the last 72 hours.   Other results:  tele -  NSR   Medications:    Infusions:    Scheduled Medications: . amLODipine  5 mg Oral Daily  . aspirin  81 mg Oral Daily  . heparin  5,000 Units Subcutaneous Q8H  . levothyroxine  50 mcg Oral QAC breakfast  . metoprolol tartrate  25 mg Oral BID  . pantoprazole  40 mg Oral Daily  . rOPINIRole  0.5 mg Oral Daily  . sertraline  50 mg Oral Daily  . simvastatin  20 mg Oral QPM    Assessment/ Plan:   Active Problems:   DOE (dyspnea on exertion)   Chest pain  1. Jaw pain :  Similar to her presenting symptoms when she was stented.  Troponin levels are all negative. myoview images are normal and reviewed by Dr. Acie Fredrickson over the weekend. She insist that the pain her jaw is very similar to her previous, and she is still dyspneic.  -- Planned to go for cath today, and has been added on. Cath orders written this morning. Cr stable.    Reino Bellis, NP-C  Agree with note by Reino Bellis NP-C  Atyp Sx with LUE pain, jaw pain and DOE. Prior H/O CAD with PCI/Stent by Arida 2011. Similar  Sx now. Enz neg. EKG w/o acute changes. MV nl. Agree with cor angio today.  Lorretta Harp, M.D., [place], [facility], [place] [address] [place], [place]  [id number]  [phone] 06/03/2016 10:11 AM

## 2016-06-03 NOTE — Interval H&P Note (Signed)
Cath Lab Visit (complete for each Cath Lab visit)  Clinical Evaluation Leading to the Procedure:   ACS: No.  Non-ACS:    Anginal Classification: CCS III  Anti-ischemic medical therapy: Minimal Therapy (1 class of medications)  Non-Invasive Test Results: Low-risk stress test findings: cardiac mortality <1%/year  Prior CABG: No previous CABG      History and Physical Interval Note:  06/03/2016 3:58 PM  Janice Glass  has presented today for surgery, with the diagnosis of cp  The various methods of treatment have been discussed with the patient and family. After consideration of risks, benefits and other options for treatment, the patient has consented to  Procedure(s): Left Heart Cath and Coronary Angiography (N/A) as a surgical intervention .  The patient's history has been reviewed, patient examined, no change in status, stable for surgery.  I have reviewed the patient's chart and labs.  Questions were answered to the patient's satisfaction.     Quay Burow

## 2016-06-03 NOTE — Progress Notes (Signed)
PROGRESS NOTE  Subjective:    75 y/o ? with a h/o CAD, HTN, HL, anxiety, depression, and hypothyroidism who presented to the ED today with a two week h/o left arm and jaw pain associated with DOE.  Her jaw pain is very similar to her presenting pain when she presented with true angina and received a stent.    Objective:    Vital Signs:   Temp:  [97.5 F (36.4 C)-98 F (36.7 C)] 98 F (36.7 C) (09/11 0407) Pulse Rate:  [66-79] 66 (09/11 0407) Resp:  [15-21] 15 (09/11 0407) BP: (127-161)/(70-89) 145/70 (09/11 0407) SpO2:  [98 %-100 %] 100 % (09/11 0407) Weight:  [173 lb 9.6 oz (78.7 kg)] 173 lb 9.6 oz (78.7 kg) (09/11 0407)  Last BM Date: 06/02/16   24-hour weight change: Weight change: 9.6 oz (0.272 kg)  Weight trends: Filed Weights   06/01/16 0500 06/02/16 0443 06/03/16 0407  Weight: 174 lb 1.6 oz (79 kg) 173 lb (78.5 kg) 173 lb 9.6 oz (78.7 kg)    Intake/Output:  09/10 0701 - 09/11 0700 In: 840 [P.O.:840] Out: -  No intake/output data recorded.   Physical Exam: BP (!) 145/70 (BP Location: Right Arm)   Pulse 66   Temp 98 F (36.7 C) (Oral)   Resp 15   Ht 5\' 3"  (1.6 m)   Wt 173 lb 9.6 oz (78.7 kg)   SpO2 100%   BMI 30.75 kg/m   Wt Readings from Last 3 Encounters:  06/03/16 173 lb 9.6 oz (78.7 kg)  09/05/15 156 lb (70.8 kg)  08/30/14 161 lb 12.8 oz (73.4 kg)    General: Vital signs reviewed and noted.   Head: Normocephalic, atraumatic.  Eyes: conjunctivae/corneas clear.  EOM's intact.   Throat: normal  Neck:  normal  Lungs:    clear  Heart:  RR  Abdomen:  Soft, non-tender, non-distended    Extremities: No edema    Neurologic: A&O X3, CN II - XII are grossly intact.   Psych: Normal     Labs: BMET:  Recent Labs  05/31/16 1200 05/31/16 2028 06/01/16 1504  NA 140  --  139  K 3.4*  --  3.5  CL 106  --  105  CO2 26  --  26  GLUCOSE 115*  --  101*  BUN 8  --  8  CREATININE 0.77 0.90 0.91  CALCIUM 9.2  --  9.0    Liver function  tests:  Recent Labs  05/31/16 1200  AST 22  ALT 13*  ALKPHOS 69  BILITOT 0.8  PROT 6.2*  ALBUMIN 3.8   No results for input(s): LIPASE, AMYLASE in the last 72 hours.  CBC:  Recent Labs  05/31/16 1200 05/31/16 2028  WBC 6.5 8.0  HGB 12.9 12.9  HCT 40.1 40.1  MCV 100.5* 101.0*  PLT 240 245    Cardiac Enzymes:  Recent Labs  05/31/16 2028 06/01/16 0333 06/01/16 1504  TROPONINI <0.03 <0.03 <0.03    Coagulation Studies: No results for input(s): LABPROT, INR in the last 72 hours.  Other: Invalid input(s): POCBNP  Recent Labs  05/31/16 2028  DDIMER 0.31   No results for input(s): HGBA1C in the last 72 hours.  Recent Labs  06/01/16 0333  CHOL 168  HDL 55  LDLCALC 84  TRIG 145  CHOLHDL 3.1    Recent Labs  05/31/16 2028  TSH 4.323   No results for input(s): VITAMINB12, FOLATE, FERRITIN, TIBC, IRON, RETICCTPCT  in the last 72 hours.   Other results:  tele -  NSR   Medications:    Infusions:    Scheduled Medications: . amLODipine  5 mg Oral Daily  . aspirin  81 mg Oral Daily  . heparin  5,000 Units Subcutaneous Q8H  . levothyroxine  50 mcg Oral QAC breakfast  . metoprolol tartrate  25 mg Oral BID  . pantoprazole  40 mg Oral Daily  . rOPINIRole  0.5 mg Oral Daily  . sertraline  50 mg Oral Daily  . simvastatin  20 mg Oral QPM    Assessment/ Plan:   Active Problems:   DOE (dyspnea on exertion)   Chest pain  1. Jaw pain :  Similar to her presenting symptoms when she was stented.  Troponin levels are all negative. myoview images are normal and reviewed by Dr. Acie Fredrickson over the weekend. She insist that the pain her jaw is very similar to her previous, and she is still dyspneic.  -- Planned to go for cath today, and has been added on. Cath orders written this morning. Cr stable.    Reino Bellis, NP-C  Agree with note by Reino Bellis NP-C  Atyp Sx with LUE pain, jaw pain and DOE. Prior H/O CAD with PCI/Stent by [facility] 2011. Similar  Sx now. Enz neg. EKG w/o acute changes. MV nl. Agree with cor angio today.  Lorretta Harp, M.D., [place], [facility], [place] [facility] [address] [place],   [id number]  [phone] 06/03/2016 10:11 AM

## 2016-06-03 NOTE — Plan of Care (Signed)
Problem: Phase II Progression Outcomes Goal: Cath/PCI Day Path if indicated Outcome: Progressing NPO for Cardiac Cath today

## 2016-06-03 NOTE — Care Management Obs Status (Signed)
[place] NOTIFICATION   Patient Details  Name: Janice Glass MRN: [id number] Date of Birth: 1941/01/12   Medicare Observation Status Notification Given:  Yes    Bethena Roys, RN 06/03/2016, 10:51 AM

## 2016-06-04 ENCOUNTER — Encounter (HOSPITAL_COMMUNITY): Payer: Self-pay | Admitting: Cardiovascular Disease

## 2016-06-04 DIAGNOSIS — I2510 Atherosclerotic heart disease of native coronary artery without angina pectoris: Secondary | ICD-10-CM | POA: Diagnosis not present

## 2016-06-04 DIAGNOSIS — R0609 Other forms of dyspnea: Secondary | ICD-10-CM | POA: Diagnosis not present

## 2016-06-04 DIAGNOSIS — R072 Precordial pain: Secondary | ICD-10-CM | POA: Diagnosis not present

## 2016-06-04 LAB — BASIC METABOLIC PANEL
Anion gap: 8 (ref 5–15)
BUN: 10 mg/dL (ref 6–20)
CO2: 26 mmol/L (ref 22–32)
CREATININE: 0.82 mg/dL (ref 0.44–1.00)
Calcium: 9.4 mg/dL (ref 8.9–10.3)
Chloride: 104 mmol/L (ref 101–111)
GFR calc Af Amer: >60 mL/min (ref >60)
GLUCOSE: 102 mg/dL — AB (ref 65–99)
POTASSIUM: 3.8 mmol/L (ref 3.5–5.1)
Sodium: 138 mmol/L (ref 135–145)

## 2016-06-04 NOTE — Discharge Summary (Signed)
Discharge Summary    Patient ID: Janice Glass,  MRN: [id number], DOB/AGE: 1941/08/25 75 y.o.  Admit date: 05/31/2016 Discharge date: 06/04/2016  Primary Care Provider: Gilford Rile Primary Cardiologist: Dr. Fletcher Anon  Discharge Diagnoses    Active Problems:   DOE (dyspnea on exertion)   Chest pain   Coronary artery disease due to lipid rich plaque   Allergies Allergies  Allergen Reactions  . Codeine Other (See Comments)    unknown  . Sulfa Antibiotics Other (See Comments)    Unknown reaction  . Sulfonamide Derivatives Other (See Comments)    unknown  . Wellbutrin [Bupropion] Diarrhea and Nausea Only  . Zolpidem Other (See Comments)    Sleep walk    Diagnostic Studies/Procedures    LHC: 9/11  Conclusion     Prox RCA lesion, 40 %stenosed.  Prox LAD to Mid LAD lesion, 0 %stenosed.  Mid LAD lesion, 50 %stenosed.  The left ventricular ejection fraction is 55-65% by visual estimate.  The left ventricular systolic function is normal.  LV end diastolic pressure is normal.   _____________   History of Present Illness     75 y/o ? with a h/o CAD s/p LAD stenting in 2011 with subsequent caths in late 2011 and 2013 revealing nonobs dzs with patent LAD stent.  She also has a h/o HTN, HL, anxiety, depression, hypothyroidism, and mild carotid dzs.  She lives in [place] with her husband, who is chronically ill s/p 6 strokes and requires adult day care.  She also lives with her 47 y/o mother who requires significant attention.  In that setting, Janice Glass has been under a lot of stress.  Beginning approx 2 wks, ago, she began to note constant, mild, dull, left upper arm pain and left jaw pain along with dyspnea on exertion.  The arm/jaw pain does not worsen with activity, position changes of involved joints, or palpation.  She notes that the jaw pain is similar to what she experienced prior to stent placement in 2011.  B/c of worsening dyspnea this AM, she presented to  urgent care and then was referred to the ED.  Here, ECG is non-acute.  Troponin is normal.  She was very anxious at the time of exam and  complaining of severe arm pain with application of the tourniquet or any attempt at blood draws/IV insertion.  Hospital Course     Consultants: None  She was admitted to observation with her cardiac enzymes cycled and negative 3, d-dimer was normal, and held nothing by mouth overnight for possible stress testing the following day. She underwent a Lexiscan Myoview which was noted to be normal, but continued to insist she was very dyspneic and uncomfortable going home. She was kept overnight plans for possible catheterization if her symptoms did not improve. She continued to be anxious, and were unable to rule out anxiety playing a role in his reported symptoms.  The following day she continued to report symptoms into her left jaw, that worsened with ambulation. The decision was made to proceed with cardiac catheterization the following day. She underwent left heart cath with Dr. Gwenlyn Found showing proximal RCA with 40% stenosis, maximal to mid LAD with no disease, mid LAD with 50% stenosis, and EF estimated at 55-65% with normal LV end-diastolic pressure. It was felt at that time that her shortness of breath and jaw pain where not related to coronary disease, and recommended continued medical therapy.  She was observed overnight without any noted complications. She  was seen and assessed by Dr. Ron Parker on 9/12 and determined stable for discharge home. I have arranged for follow up in the [place] office in 2 weeks.  _____________  Discharge Vitals Blood pressure 140/66, pulse 74, temperature 97.5 F (36.4 C), temperature source Oral, resp. rate (!) 21, height 5\' 3"  (1.6 m), weight 173 lb 8 oz (78.7 kg), SpO2 95 %.  Filed Weights   06/02/16 0443 06/03/16 0407 06/04/16 0503  Weight: 173 lb (78.5 kg) 173 lb 9.6 oz (78.7 kg) 173 lb 8 oz (78.7 kg)    Labs & Radiologic  Studies    CBC No results for input(s): WBC, NEUTROABS, HGB, HCT, MCV, PLT in the last 72 hours. Basic Metabolic Panel  Recent Labs  06/01/16 1504 06/04/16 1141  NA 139 138  K 3.5 3.8  CL 105 104  CO2 26 26  GLUCOSE 101* 102*  BUN 8 10  CREATININE 0.91 0.82  CALCIUM 9.0 9.4   Liver Function Tests No results for input(s): AST, ALT, ALKPHOS, BILITOT, PROT, ALBUMIN in the last 72 hours. No results for input(s): LIPASE, AMYLASE in the last 72 hours. Cardiac Enzymes  Recent Labs  06/01/16 1504  TROPONINI <0.03   BNP Invalid input(s): POCBNP D-Dimer No results for input(s): DDIMER in the last 72 hours. Hemoglobin A1C No results for input(s): HGBA1C in the last 72 hours. Fasting Lipid Panel No results for input(s): CHOL, HDL, LDLCALC, TRIG, CHOLHDL, LDLDIRECT in the last 72 hours. Thyroid Function Tests No results for input(s): TSH, T4TOTAL, T3FREE, THYROIDAB in the last 72 hours.  Invalid input(s): FREET3 _____________  Dg Chest 2 View  Result Date: 05/31/2016 CLINICAL DATA:  Left arm and jaw pain for 3 weeks.  Hypertension. EXAM: CHEST  2 VIEW COMPARISON:  01/23/2015 FINDINGS: Atherosclerotic calcification of the aortic arch. Mild lingular scarring. The lungs appear otherwise clear. Thoracic spondylosis. IMPRESSION: 1. Stable lingular scarring. 2.  Atherosclerotic calcification of the aortic arch. 3. No acute findings. Electronically Signed   By: Van Clines M.D.   On: 05/31/2016 13:10   Nm Myocar Multi W/spect W/wall Motion / Ef  Result Date: 06/01/2016 CLINICAL DATA:  75 year old female with a history of chest pain. Cardiovascular risk factors include known coronary artery disease with prior stenting, hypertension, known vascular disease with carotid artery disease, family history. EXAM: MYOCARDIAL IMAGING WITH SPECT (REST AND PHARMACOLOGIC-STRESS) GATED LEFT VENTRICULAR WALL MOTION STUDY LEFT VENTRICULAR EJECTION FRACTION TECHNIQUE: Standard myocardial SPECT imaging  was performed after resting intravenous injection of 10 mCi Tc-18m tetrofosmin. Subsequently, intravenous infusion of Lexiscan was performed under the supervision of the Cardiology staff. At peak effect of the drug, 30 mCi Tc-76m tetrofosmin was injected intravenously and standard myocardial SPECT imaging was performed. Quantitative gated imaging was also performed to evaluate left ventricular wall motion, and estimate left ventricular ejection fraction. COMPARISON:  None. FINDINGS: Perfusion: No reversible defect identified. Questionable small fixed defect at the inferior lateral wall in the mid ventricle. Wall Motion: Normal left ventricular wall motion. No left ventricular dilation. Left Ventricular Ejection Fraction: 77 % End diastolic volume 58 ml End systolic volume 13 ml IMPRESSION: 1. No reversible perfusion defect identified. There is a questionable small fixed defect at the inferior lateral wall in the mid ventricle. 2. Normal left ventricular wall motion. 3. Left ventricular ejection fraction 77% 4. Non invasive risk stratification*: Low *2012 Appropriate Use Criteria for Coronary Revascularization Focused Update: J Am Coll Cardiol. [id number]. http://content.[email or web address].aspx?articleid=1201161 Electronically Signed   By: Corrie Mckusick D.O.  On: 06/01/2016 15:04   Disposition   Pt is being discharged home today in good condition.  Follow-up Plans & Appointments    Follow-up Information    Christell Faith, PA-C Follow up on 06/21/2016.   Specialties:  Physician Assistant, Cardiology, Radiology Why:  2:30pm for your hospital follow up Contact information: [place] [address] [phone]          Discharge Instructions    Diet - low sodium heart healthy    Complete by:  As directed   Discharge instructions    Complete by:  As directed   Radial Site Care Refer to this sheet in the next few weeks. These instructions provide you with  information on caring for yourself after your procedure. Your caregiver may also give you more specific instructions. Your treatment has been planned according to current medical practices, but problems sometimes occur. Call your caregiver if you have any problems or questions after your procedure. HOME CARE INSTRUCTIONS You may shower the day after the procedure.Remove the bandage (dressing) and gently wash the site with plain soap and water.Gently pat the site dry.  Do not apply powder or lotion to the site.  Do not submerge the affected site in water for 3 to 5 days.  Inspect the site at least twice daily.  Do not flex or bend the affected arm for 24 hours.  No lifting over 5 pounds (2.3 kg) for 5 days after your procedure.  Do not drive home if you are discharged the same day of the procedure. Have someone else drive you.  You may drive 24 hours after the procedure unless otherwise instructed by your caregiver.  What to expect: Any bruising will usually fade within 1 to 2 weeks.  Blood that collects in the tissue (hematoma) may be painful to the touch. It should usually decrease in size and tenderness within 1 to 2 weeks.  SEEK IMMEDIATE MEDICAL CARE IF: You have unusual pain at the radial site.  You have redness, warmth, swelling, or pain at the radial site.  You have drainage (other than a small amount of blood on the dressing).  You have chills.  You have a fever or persistent symptoms for more than 72 hours.  You have a fever and your symptoms suddenly get worse.  Your arm becomes pale, cool, tingly, or numb.  You have heavy bleeding from the site. Hold pressure on the site.   Increase activity slowly    Complete by:  As directed      Discharge Medications   Current Discharge Medication List    CONTINUE these medications which have NOT CHANGED   Details  ALPRAZolam (XANAX) 0.25 MG tablet Take 0.25 mg by mouth daily as needed for anxiety or sleep.     amLODipine (NORVASC) 5  MG tablet Take 5 mg by mouth daily.     aspirin 81 MG EC tablet Take 1 tablet (81 mg total) by mouth daily.    cetirizine (ZYRTEC) 10 MG tablet Take 10 mg by mouth daily.    fluticasone (FLONASE) 50 MCG/ACT nasal spray Place 2 sprays into both nostrils every morning.     levothyroxine (SYNTHROID, LEVOTHROID) 50 MCG tablet Take 50 mcg by mouth daily.     Menthol (BIOFREEZE) 10 % AERO Apply 1 application topically 2 (two) times daily as needed (for pain).    metoprolol tartrate (LOPRESSOR) 25 MG tablet Take 1 tablet (25 mg total) by mouth 2 (two) times  daily. Qty: 180 tablet, Refills: 3    nitroGLYCERIN (NITROSTAT) 0.4 MG SL tablet Place 1 tablet (0.4 mg total) under the tongue every 5 (five) minutes as needed. For chest pain Qty: 25 tablet, Refills: 3    omeprazole (PRILOSEC) 40 MG capsule Take 40 mg by mouth 2 (two) times daily.     ondansetron (ZOFRAN) 4 MG tablet Take 4 mg by mouth every 8 (eight) hours as needed for nausea or vomiting.     rOPINIRole (REQUIP) 0.5 MG tablet Take 0.25 mg by mouth at bedtime.     simvastatin (ZOCOR) 20 MG tablet Take 1 tablet (20 mg total) by mouth every evening. Qty: 90 tablet, Refills: 1    vitamin B-12 (CYANOCOBALAMIN) 1000 MCG tablet Take 1,000 mcg by mouth daily.      STOP taking these medications     OVER THE COUNTER MEDICATION      sertraline (ZOLOFT) 50 MG tablet          Outstanding Labs/Studies   None  Duration of Discharge Encounter   Greater than 30 minutes including physician time.  Signed, Reino Bellis NP-C 06/04/2016, 12:48 PM  Patient seen and examined. I agree with the assessment and plan as detailed above. See also my additional thoughts below.   Refer to my progress note from today. I made the decision for discharge. I agree with the note above in the plan. The patient is ready to go home.  Dola Argyle, MD, [facility] 06/05/2016 8:44 AM

## 2016-06-04 NOTE — Progress Notes (Signed)
Patient Name:  Janice Glass, DOB: 1940-11-15, MRN: [id number] Primary Doctor: Gilford Rile, MD Primary Cardiologist:   Date: 06/04/2016   SUBJECTIVE: The patient underwent cardiac catheterization yesterday. It is felt that the shortness of breath and jaw pain is not related to coronary artery disease at this time. She is stable today.  Past Medical History:  Diagnosis Date  . Anxiety   . CAD (coronary artery disease)    a. 11/2009 Abnl myoview - inferobasal ischemia;  b. 12/2009 PCI/DES to pLAD w/ 3x15 mm Promus DES;  c. Relook Cath 01/08/2010: patent stent, nonobs dzs. Relook cath 02/2012: patent LAD stent with mild disease.  . Depression   . GERD (gastroesophageal reflux disease)   . HLD (hyperlipidemia)   . Hypertensive heart disease   . Hypothyroidism   . Mild Carotid Arterial Disease    a. 04/2015 Carotid U/S: 1-39% bilat ICA stenosis.  . Mild mitral regurgitation    a. 08/2015 Echo: Ef 55-60%, no rwma, mild MR.   Vitals:   06/03/16 1935 06/03/16 2128 06/03/16 2134 06/04/16 0503  BP:  (!) 123/54 (!) 124/54 140/66  Pulse: 93 74 78 74  Resp: (!) 22 12  (!) 21  Temp:  98 F (36.7 C)  97.5 F (36.4 C)  TempSrc:  Oral  Oral  SpO2: 97% 95%  95%  Weight:    173 lb 8 oz (78.7 kg)  Height:        Intake/Output Summary (Last 24 hours) at 06/04/16 0747 Last data filed at 06/04/16 0400  Gross per 24 hour  Intake              240 ml  Output                0 ml  Net              240 ml   Filed Weights   06/02/16 0443 06/03/16 0407 06/04/16 0503  Weight: 173 lb (78.5 kg) 173 lb 9.6 oz (78.7 kg) 173 lb 8 oz (78.7 kg)     LABS: Basic Metabolic Panel:  Recent Labs  06/01/16 1504  NA 139  K 3.5  CL 105  CO2 26  GLUCOSE 101*  BUN 8  CREATININE 0.91  CALCIUM 9.0   Liver Function Tests: No results for input(s): AST, ALT, ALKPHOS, BILITOT, PROT, ALBUMIN in the last 72 hours. No results for input(s): LIPASE, AMYLASE in the last 72 hours. CBC: No results for  input(s): WBC, NEUTROABS, HGB, HCT, MCV, PLT in the last 72 hours. Cardiac Enzymes:  Recent Labs  06/01/16 1504  TROPONINI <0.03   BNP: Invalid input(s): POCBNP D-Dimer: No results for input(s): DDIMER in the last 72 hours. Thyroid Function Tests: No results for input(s): TSH, T4TOTAL, T3FREE, THYROIDAB in the last 72 hours.  Invalid input(s): FREET3  RADIOLOGY: Dg Chest 2 View  Result Date: 05/31/2016 CLINICAL DATA:  Left arm and jaw pain for 3 weeks.  Hypertension. EXAM: CHEST  2 VIEW COMPARISON:  01/23/2015 FINDINGS: Atherosclerotic calcification of the aortic arch. Mild lingular scarring. The lungs appear otherwise clear. Thoracic spondylosis. IMPRESSION: 1. Stable lingular scarring. 2.  Atherosclerotic calcification of the aortic arch. 3. No acute findings. Electronically Signed   By: Van Clines M.D.   On: 05/31/2016 13:10   Nm Myocar Multi W/spect W/wall Motion / Ef  Result Date: 06/01/2016 CLINICAL DATA:  75 year old female with a history of chest pain. Cardiovascular risk factors include known coronary artery  disease with prior stenting, hypertension, known vascular disease with carotid artery disease, family history. EXAM: MYOCARDIAL IMAGING WITH SPECT (REST AND PHARMACOLOGIC-STRESS) GATED LEFT VENTRICULAR WALL MOTION STUDY LEFT VENTRICULAR EJECTION FRACTION TECHNIQUE: Standard myocardial SPECT imaging was performed after resting intravenous injection of 10 mCi Tc-29m tetrofosmin. Subsequently, intravenous infusion of Lexiscan was performed under the supervision of the Cardiology staff. At peak effect of the drug, 30 mCi Tc-3m tetrofosmin was injected intravenously and standard myocardial SPECT imaging was performed. Quantitative gated imaging was also performed to evaluate left ventricular wall motion, and estimate left ventricular ejection fraction. COMPARISON:  None. FINDINGS: Perfusion: No reversible defect identified. Questionable small fixed defect at the inferior  lateral wall in the mid ventricle. Wall Motion: Normal left ventricular wall motion. No left ventricular dilation. Left Ventricular Ejection Fraction: 77 % End diastolic volume 58 ml End systolic volume 13 ml IMPRESSION: 1. No reversible perfusion defect identified. There is a questionable small fixed defect at the inferior lateral wall in the mid ventricle. 2. Normal left ventricular wall motion. 3. Left ventricular ejection fraction 77% 4. Non invasive risk stratification*: Low *2012 Appropriate Use Criteria for Coronary Revascularization Focused Update: J Am Coll Cardiol. [id number]. http://content.[email or web address].aspx?articleid=[email or web address] Electronically Signed   By: Corrie Mckusick D.O.   On: 06/01/2016 15:04    PHYSICAL EXAM   The patient is oriented to person time and place. Affect is normal. The cath site in her right radial is stable.   TELEMETRY: I have reviewed telemetry today June 04, 2016. There is normal sinus rhythm.   ASSESSMENT AND PLAN:  Active Problems:   DOE (dyspnea on exertion)   Jaw pain   Coronary artery disease due to lipid rich plaque     At this point it is felt that the patient's shortness of breath and jaw discomfort are not related to significant coronary obstruction at this time. No further workup is needed. She is stable and can be discharged home.  Dola Argyle 06/04/2016 7:47 AM

## 2016-06-19 ENCOUNTER — Ambulatory Visit (INDEPENDENT_AMBULATORY_CARE_PROVIDER_SITE_OTHER): Payer: Medicare HMO | Admitting: Physician Assistant

## 2016-06-19 ENCOUNTER — Encounter: Payer: Self-pay | Admitting: Physician Assistant

## 2016-06-19 VITALS — BP 188/91 | HR 76 | Ht 63.0 in | Wt 171.6 lb

## 2016-06-19 DIAGNOSIS — R0609 Other forms of dyspnea: Secondary | ICD-10-CM

## 2016-06-19 DIAGNOSIS — I10 Essential (primary) hypertension: Secondary | ICD-10-CM

## 2016-06-19 DIAGNOSIS — I208 Other forms of angina pectoris: Secondary | ICD-10-CM | POA: Diagnosis not present

## 2016-06-19 MED ORDER — METOPROLOL TARTRATE 50 MG PO TABS: 50.0000 mg | ORAL_TABLET | Freq: Two times a day (BID) | ORAL | 6 refills | Status: DC

## 2016-06-19 MED ORDER — ISOSORBIDE MONONITRATE ER 30 MG PO TB24: 30.0000 mg | ORAL_TABLET | Freq: Every day | ORAL | 3 refills | Status: DC

## 2016-06-19 MED ORDER — NITROGLYCERIN 0.4 MG SL SUBL
0.4000 mg | SUBLINGUAL_TABLET | SUBLINGUAL | 3 refills | Status: DC | PRN
Start: 2016-06-19 — End: 2019-07-06

## 2016-06-19 NOTE — Progress Notes (Signed)
Cardiology Office Note   Date:  06/19/2016   ID:  HELANE VANROY, DOB [birth year], MRN [id number]  PCP:  Gilford Rile, MD  Cardiologist:  Dr Jillene Bucks, PA-C   Chief Complaint  Patient presents with  . Follow-up    hospital follow up    History of Present Illness: Janice Glass is a 75 y.o. female with a history of LAD stent 2011, later caths w/ nonobs dz, HTN, HLD, anxiety, depression, hypothyroid, mild carotid dz.  D/c 09/12 after admit for CP>>MV was ok but pt anxious>>cath w/ non-obs dz>>d/c home.  Janice Glass presents for Post hospital follow-up.  Since discharge from the hospital, her symptoms have not really changed. She continues to have intermittent problems with pain under her left eye. She has also had some pain and tingling in her left arm but not as much. The symptoms tend to be prolonged and are not related to exertion. She has also had dyspnea on exertion and this was going on or before her hospital stay.  She has a great many social stressors in her life. She was caring for her husband with significant medical problems at home. However, he recently was hospitalized and then went from there to a facility. She is getting use to him being in the facility. She still helps provide care for her 19 year old mother.  [facility] However, she was getting short of breath walking from the parking lot across the facility into the room. The shortness of breath was what started her evaluation. It has not improved. She has not had lower extremity edema, orthopnea or PND.  She does not check her blood pressure at home and does not know if it has been running this high or not.  Dr. Gwenlyn Found mentioned putting her on a long-acting nitroglycerin tablet to treat her for small vessel disease and they are interested in pursuing this.   Past Medical History:  Diagnosis Date  . Anxiety   . CAD (coronary artery disease)    a. 11/2009 Abnl myoview - inferobasal ischemia;  b. 12/2009 PCI/DES to pLAD w/ 3x15 mm Promus DES;  c. Relook Cath 01/08/2010: patent stent, nonobs dzs. Relook cath 02/2012: patent LAD stent with mild disease.  . Depression   . GERD (gastroesophageal reflux disease)   . HLD (hyperlipidemia)   . Hypertensive heart disease   . Hypothyroidism   . Mild Carotid Arterial Disease    a. 04/2015 Carotid U/S: 1-39% bilat ICA stenosis.  . Mild mitral regurgitation    a. 08/2015 Echo: Ef 55-60%, no rwma, mild MR.    Past Surgical History:  Procedure Laterality Date  . CARDIAC CATHETERIZATION  12/2009  . CARDIAC CATHETERIZATION N/A 06/03/2016   Procedure: Left Heart Cath and Coronary Angiography;  Surgeon: Lorretta Harp, MD;  Location: [facility];  Service: Cardiovascular;  Laterality: N/A;  . CESAREAN SECTION     several   . CORONARY ANGIOPLASTY WITH STENT PLACEMENT  12/2009   DES to proximal LAD  . CORONARY STENT PLACEMENT    . LEFT HEART CATHETERIZATION WITH CORONARY ANGIOGRAM N/A 03/20/2012   Procedure: LEFT HEART CATHETERIZATION WITH CORONARY ANGIOGRAM;  Surgeon: Hillary Bow, MD;  Location: [facility] C[facility];  Service: Cardiovascular;  Laterality: N/A;    Current Outpatient Prescriptions  Medication Sig Dispense Refill  . ALPRAZolam (XANAX) 0.25 MG tablet Take 0.25 mg by mouth daily as needed for anxiety or  sleep.     . amLODipine (NORVASC) 5 MG tablet Take 5 mg by mouth daily.     Marland Kitchen aspirin 81 MG EC tablet Take 1 tablet (81 mg total) by mouth daily.    . cetirizine (ZYRTEC) 10 MG tablet Take 10 mg by mouth daily.    . fluticasone (FLONASE) 50 MCG/ACT nasal spray Place 2 sprays into both nostrils every morning.     Marland Kitchen levothyroxine (SYNTHROID, LEVOTHROID) 50 MCG tablet Take 50 mcg by mouth daily.     . Menthol (BIOFREEZE) 10 % AERO Apply 1 application topically 2 (two) times daily as needed (for pain).    . metoprolol tartrate (LOPRESSOR) 25 MG tablet Take 1 tablet (25 mg total) by  mouth 2 (two) times daily. 180 tablet 3  . nitroGLYCERIN (NITROSTAT) 0.4 MG SL tablet Place 1 tablet (0.4 mg total) under the tongue every 5 (five) minutes as needed. For chest pain 25 tablet 3  . omeprazole (PRILOSEC) 40 MG capsule Take 40 mg by mouth 2 (two) times daily.     . ondansetron (ZOFRAN) 4 MG tablet Take 4 mg by mouth every 8 (eight) hours as needed for nausea or vomiting.     Marland Kitchen rOPINIRole (REQUIP) 0.5 MG tablet Take 0.25 mg by mouth at bedtime.     . simvastatin (ZOCOR) 20 MG tablet Take 1 tablet (20 mg total) by mouth every evening. 90 tablet 1  . vitamin B-12 (CYANOCOBALAMIN) 1000 MCG tablet Take 1,000 mcg by mouth daily.     No current facility-administered medications for this visit.     Allergies:   Codeine; Sulfa antibiotics; Sulfonamide derivatives; Wellbutrin [bupropion]; and Zolpidem    Social History:  The patient  reports that she has never smoked. She has never used smokeless tobacco. She reports that she does not drink alcohol or use drugs.   Family History:  The patient's family history includes Heart failure in her mother; Other in her father.    ROS:  Please see the history of present illness. All other systems are reviewed and negative.    PHYSICAL EXAM: VS:  BP (!) 188/91   Pulse 76   Ht 5\' 3"  (1.6 m)   Wt 171 lb 9.6 oz (77.8 kg)   BMI 30.40 kg/m  , BMI Body mass index is 30.4 kg/m. GEN: Well nourished, well developed, female in no acute distress  HEENT: normal for age  Neck: no JVD, no carotid bruit, no masses Cardiac: RRR; no murmur, no rubs, or gallops Respiratory:  clear to auscultation bilaterally, normal work of breathing GI: soft, nontender, nondistended, + BS MS: no deformity or atrophy; no edema; distal pulses are 2+ in all 4 extremities   Skin: warm and dry, no rash Neuro:  Strength and sensation are intact Psych: euthymic mood, full affect   EKG:  EKG is not ordered today.  CATH: 06/03/2016  Prox RCA lesion, 40 %stenosed.  Prox  LAD to Mid LAD lesion, 0 %stenosed.  Mid LAD lesion, 50 %stenosed.  The left ventricular ejection fraction is 55-65% by visual estimate.  The left ventricular systolic function is normal.  LV end diastolic pressure is normal.     Recent Labs: 05/31/2016: ALT 13; B Natriuretic Peptide 111.8; Hemoglobin 12.9; Platelets 245; TSH 4.323 06/04/2016: BUN 10; Creatinine, Ser 0.82; Potassium 3.8; Sodium 138    Lipid Panel    Component Value Date/Time   CHOL 168 06/01/2016 0333   TRIG 145 06/01/2016 0333   HDL 55 06/01/2016 0333  CHOLHDL 3.1 06/01/2016 0333   VLDL 29 06/01/2016 0333   LDLCALC 84 06/01/2016 0333     Wt Readings from Last 3 Encounters:  06/19/16 171 lb 9.6 oz (77.8 kg)  06/04/16 173 lb 8 oz (78.7 kg)  09/05/15 156 lb (70.8 kg)     Other studies Reviewed: Additional studies/ records that were reviewed today include: Office notes, hospital records and testing.  ASSESSMENT AND PLAN:  1.  Dyspnea on exertion: She ambulated around the office and her heart rate got up to 122 bpm, but her oxygen saturation remained stable at 94% or better. She did feel somewhat short of breath. She is encouraged to continue trying to increase her activity, but do it in such a way that she does it in a more gradual manner. A d-dimer was checked during her last hospitalization and was negative.  2. Left cheek pain, patient states this is an anginal equivalent: I reviewed the cardiac catheterization results with the patient and her companion. She has no obstructive disease and normal heart muscle function. I wonder if the symptoms are from uncontrolled high blood pressure. We will increase her beta blocker to blunt her tachycardic response to exertion, and control her blood pressure better. We will also add Imdur 30 mg to her medication regimen to treat possible small vessel disease.  She is to monitor her symptoms and contact us if they do not improve.   Current medicines are reviewed at  length with the patient today.  The patient has concerns regarding medicines. Concerns were addressed  The following changes have been made:  Increase metoprolol and add Imdur  Labs/ tests ordered today include:  No orders of the defined types were placed in this encounter.    Disposition:   FU with Dr. Fletcher Anon  Signed, Lenoard Aden  06/19/2016 4:34 PM    [place] Phone: [phone]; Fax: [phone]  This note was written with the assistance of speech recognition software. Please excuse any transcriptional errors.

## 2016-06-19 NOTE — Patient Instructions (Signed)
Medications:  START Isosorbide 30 mg daily. If you experience headaches, cut back to 1/2 tab daily.  Increase metoprolol to 50 mg twice daily.   Other Instructions:  Your physician has requested that you regularly monitor and record your blood pressure readings at home. Please use the same machine at the same time of day to check your readings and record them to bring to your follow-up visit.   OK to increase activity, but do not overexert yourself.   Follow-Up:  Your physician recommends that you schedule a follow-up appointment in: 3 months with Dr. Fletcher Anon. If you need anything or have any concerns until then, please do not hesitate to call. [phone].  If you need a refill on your cardiac medications before your next appointment, please call your pharmacy.

## 2016-06-21 ENCOUNTER — Ambulatory Visit: Payer: Medicare HMO | Admitting: Physician Assistant

## 2016-06-28 ENCOUNTER — Telehealth: Payer: Self-pay | Admitting: Physician Assistant

## 2016-06-28 NOTE — Telephone Encounter (Signed)
New message      Pt c/o medication issue:  1. Name of Medication: isosorbide, metoprolol 2. How are you currently taking this medication (dosage and times per day)? 30mg  of isosorbide, 50mg  of metoprolol 3. Are you having a reaction (difficulty breathing--STAT)? no  4. What is your medication issue? Medication is giving her a terrible headache so pt stopped taking this medication last Friday. Bp is 147/79. Please advise

## 2016-06-28 NOTE — Telephone Encounter (Signed)
Spoke with pt states that her headache was too bad to continue isosorbide so she stopped, also she states that she is still taking the Metoprolol. She has increased her amlodipine to  10mg  this whole week and he BP today is 147/79. Do you want to replace her isosorbide? Please advise.

## 2016-07-04 NOTE — Telephone Encounter (Signed)
lmtcb

## 2016-07-04 NOTE — Telephone Encounter (Signed)
OK to d/c the Imdur, continue the increased amlodipine. If she calls back with BP issues, make sure we know her heart rate. The BB would be the next thing to increase, if possible. Thanks

## 2016-07-05 NOTE — Telephone Encounter (Signed)
Spoke with pt she reports that her BP is 130's/70's and her HR is 69. Her headache is still there 2/10 this is tolerable for her. She has appt next week to discuss. FYI

## 2016-07-05 NOTE — Telephone Encounter (Signed)
LMTCB

## 2016-07-09 ENCOUNTER — Ambulatory Visit: Payer: Medicare HMO | Admitting: Cardiovascular Disease

## 2016-09-03 ENCOUNTER — Ambulatory Visit: Payer: Medicare HMO | Admitting: Cardiovascular Disease

## 2016-10-08 ENCOUNTER — Ambulatory Visit: Payer: Medicare HMO | Admitting: Cardiovascular Disease

## 2016-10-28 NOTE — Progress Notes (Signed)
Cardiology Office Note   Date:  11/02/2016   ID:  JULIEN BEERE, DOB 1940-11-11, MRN [id number]  PCP:  Gilford Rile, MD  Cardiologist:   Kathlyn Sacramento, MD   No chief complaint on file.     History of Present Illness: Janice Glass is a 76 y.o. female who presents for a followup visit. She has known history of coronary artery disease with previous drug-eluting stent placement to the LAD. She had a repeat cardiac catheterization in June of 2013 which showed patent stent with mild disease elsewhere. She was hospitalized in September 2017 with symptoms suggestive of unstable angina. Cardiac enzymes were negative and EKG showed no acute changes. She underwent cardiac catheterization which showed patent LAD stent with moderate mid LAD stenosis beyond the stented segment. Her blood pressure was elevated with elevated heart rate and it is possible some of her symptoms were related to that. Both metoprolol and amlodipine were increased with subsequent improvement in blood pressure. She reports no further chest pain. She continues to complain of exertional dyspnea without orthopnea or PND. She has lower back pain and some numbness in her feet.  Past Medical History:  Diagnosis Date  . Anxiety   . CAD (coronary artery disease)    a. 11/2009 Abnl myoview - inferobasal ischemia;  b. 12/2009 PCI/DES to pLAD w/ 3x15 mm Promus DES;  c. Relook Cath 01/08/2010: patent stent, nonobs dzs. Relook cath 02/2012: patent LAD stent with mild disease.  . Depression   . GERD (gastroesophageal reflux disease)   . HLD (hyperlipidemia)   . Hypertensive heart disease   . Hypothyroidism   . Mild Carotid Arterial Disease    a. 04/2015 Carotid U/S: 1-39% bilat ICA stenosis.  . Mild mitral regurgitation    a. 08/2015 Echo: Ef 55-60%, no rwma, mild MR.    Past Surgical History:  Procedure Laterality Date  . CARDIAC CATHETERIZATION  12/2009  . CARDIAC CATHETERIZATION N/A 06/03/2016   Procedure: Left Heart  Cath and Coronary Angiography;  Surgeon: Lorretta Harp, MD;  Location: [facility];  Service: Cardiovascular;  Laterality: N/A;  . CESAREAN SECTION     several   . CORONARY ANGIOPLASTY WITH STENT PLACEMENT  12/2009   DES to proximal LAD  . CORONARY STENT PLACEMENT    . LEFT HEART CATHETERIZATION WITH CORONARY ANGIOGRAM N/A 03/20/2012   Procedure: LEFT HEART CATHETERIZATION WITH CORONARY ANGIOGRAM;  Surgeon: Hillary Bow, MD;  Location: [facility] CATH LAB;  Service: Cardiovascular;  Laterality: N/A;     Current Outpatient Prescriptions  Medication Sig Dispense Refill  . amLODipine (NORVASC) 10 MG tablet Take 10 mg by mouth daily.    Marland Kitchen aspirin 81 MG EC tablet Take 1 tablet (81 mg total) by mouth daily.    . cetirizine (ZYRTEC) 10 MG tablet Take 10 mg by mouth daily.    . fluticasone (FLONASE) 50 MCG/ACT nasal spray Place 2 sprays into both nostrils every morning.     Marland Kitchen levothyroxine (SYNTHROID, LEVOTHROID) 50 MCG tablet Take 50 mcg by mouth daily.     . Menthol (BIOFREEZE) 10 % AERO Apply 1 application topically 2 (two) times daily as needed (for pain).    . metoprolol tartrate (LOPRESSOR) 50 MG tablet Take 1 tablet (50 mg total) by mouth 2 (two) times daily. 60 tablet 6  . Multiple Vitamins-Minerals (EYE VITAMINS) CAPS Take 1 capsule by mouth daily.    . nitroGLYCERIN (NITROSTAT) 0.4 MG SL tablet Place 1 tablet (0.4 mg total)  under the tongue every 5 (five) minutes as needed. For chest pain 25 tablet 3  . omeprazole (PRILOSEC) 40 MG capsule Take 40 mg by mouth 2 (two) times daily.     . ondansetron (ZOFRAN) 4 MG tablet Take 4 mg by mouth every 8 (eight) hours as needed for nausea or vomiting.     Marland Kitchen rOPINIRole (REQUIP) 0.5 MG tablet Take 0.25 mg by mouth at bedtime.     . simvastatin (ZOCOR) 20 MG tablet Take 1 tablet (20 mg total) by mouth every evening. 90 tablet 1  . vitamin B-12 (CYANOCOBALAMIN) 1000 MCG tablet Take 1,000 mcg by mouth daily.     No current facility-administered  medications for this visit.     Allergies:   Codeine; Sulfa antibiotics; Sulfonamide derivatives; Wellbutrin [bupropion]; and Zolpidem    Social History:  The patient  reports that she has never smoked. She has never used smokeless tobacco. She reports that she does not drink alcohol or use drugs.   Family History:  The patient's family history includes Heart failure in her mother.    ROS:  Please see the history of present illness.   Otherwise, review of systems are positive for none.   All other systems are reviewed and negative.    PHYSICAL EXAM: VS:  BP 120/60   Pulse 74   Ht 5\' 3"  (1.6 m)   Wt 177 lb (80.3 kg)   BMI 31.35 kg/m  , BMI Body mass index is 31.35 kg/m. GEN: Well nourished, well developed, in no acute distress  HEENT: normal  Neck: no JVD, carotid bruits, or masses Cardiac: RRR; no rubs, or gallops,no edema . 2/6 systolic ejection murmur in the aortic area. Respiratory:  clear to auscultation bilaterally, normal work of breathing GI: soft, nontender, nondistended, + BS MS: no deformity or atrophy  Skin: warm and dry, no rash Neuro:  Strength and sensation are intact Psych: euthymic mood, full affect   EKG:  EKG is ordered today. The ekg ordered today demonstrates normal sinus rhythm with LVH and possible PACs.   Recent Labs: 05/31/2016: ALT 13; B Natriuretic Peptide 111.8; Hemoglobin 12.9; Platelets 245; TSH 4.323 06/04/2016: BUN 10; Creatinine, Ser 0.82; Potassium 3.8; Sodium 138    Lipid Panel    Component Value Date/Time   CHOL 168 06/01/2016 0333   TRIG 145 06/01/2016 0333   HDL 55 06/01/2016 0333   CHOLHDL 3.1 06/01/2016 0333   VLDL 29 06/01/2016 0333   LDLCALC 84 06/01/2016 0333      Wt Readings from Last 3 Encounters:  10/29/16 177 lb (80.3 kg)  06/19/16 171 lb 9.6 oz (77.8 kg)  06/04/16 173 lb 8 oz (78.7 kg)       No flowsheet data found.    ASSESSMENT AND PLAN:  1.  Coronary artery disease involving native coronary arteries  without angina: Cardiac catheterization in September showed no obstructive disease. Continue medical therapy.  2. Essential hypertension: Blood pressure is now well controlled after adjusting her medications.  3. Hyperlipidemia: Continue treatment with simvastatin with a target LDL of less than 70.    Disposition:   FU with me in 1 year  Signed,  Kathlyn Sacramento, MD  11/02/2016 10:53 AM    [place]

## 2016-10-29 ENCOUNTER — Encounter: Payer: Self-pay | Admitting: *Deleted

## 2016-10-29 ENCOUNTER — Ambulatory Visit (INDEPENDENT_AMBULATORY_CARE_PROVIDER_SITE_OTHER): Payer: Medicare HMO | Admitting: Cardiovascular Disease

## 2016-10-29 VITALS — BP 120/60 | HR 74 | Ht 63.0 in | Wt 177.0 lb

## 2016-10-29 DIAGNOSIS — E785 Hyperlipidemia, unspecified: Secondary | ICD-10-CM | POA: Diagnosis not present

## 2016-10-29 DIAGNOSIS — I10 Essential (primary) hypertension: Secondary | ICD-10-CM | POA: Diagnosis not present

## 2016-10-29 DIAGNOSIS — I2510 Atherosclerotic heart disease of native coronary artery without angina pectoris: Secondary | ICD-10-CM

## 2016-10-29 NOTE — Patient Instructions (Signed)

## 2016-11-20 DIAGNOSIS — J219 Acute bronchiolitis, unspecified: Secondary | ICD-10-CM

## 2016-11-20 DIAGNOSIS — K219 Gastro-esophageal reflux disease without esophagitis: Secondary | ICD-10-CM | POA: Diagnosis not present

## 2016-11-20 DIAGNOSIS — E039 Hypothyroidism, unspecified: Secondary | ICD-10-CM | POA: Diagnosis not present

## 2016-11-20 DIAGNOSIS — I959 Hypotension, unspecified: Secondary | ICD-10-CM

## 2016-11-20 DIAGNOSIS — I10 Essential (primary) hypertension: Secondary | ICD-10-CM | POA: Diagnosis not present

## 2016-11-20 DIAGNOSIS — E876 Hypokalemia: Secondary | ICD-10-CM | POA: Diagnosis not present

## 2016-11-20 DIAGNOSIS — E860 Dehydration: Secondary | ICD-10-CM

## 2016-11-20 DIAGNOSIS — E872 Acidosis: Secondary | ICD-10-CM | POA: Diagnosis not present

## 2016-11-21 DIAGNOSIS — E872 Acidosis: Secondary | ICD-10-CM | POA: Diagnosis not present

## 2016-11-21 DIAGNOSIS — I959 Hypotension, unspecified: Secondary | ICD-10-CM | POA: Diagnosis not present

## 2016-11-21 DIAGNOSIS — E860 Dehydration: Secondary | ICD-10-CM | POA: Diagnosis not present

## 2016-11-21 DIAGNOSIS — J219 Acute bronchiolitis, unspecified: Secondary | ICD-10-CM | POA: Diagnosis not present

## 2017-12-01 NOTE — Progress Notes (Signed)
Cardiology Office Note   Date:  12/02/2017   ID:  Janice Glass, DOB 1941-04-28, MRN [id number]  PCP:  Raina Mina., MD  Cardiologist:   Kathlyn Sacramento, MD   No chief complaint on file.     History of Present Illness: Janice Glass is a 77 y.o. female who presents for a followup visit. She has known history of coronary artery disease with previous drug-eluting stent placement to the LAD. She had a repeat cardiac catheterization in June of 2013 which showed patent stent with mild disease elsewhere. She was hospitalized in September 2017 with symptoms suggestive of unstable angina. Cardiac enzymes were negative and EKG showed no acute changes. She underwent cardiac catheterization which showed patent LAD stent with moderate mid LAD stenosis beyond the stented segment.  Her symptoms happened in the setting of uncontrolled hypertension and improved after increasing metoprolol and amlodipine.  The patient is here for  a yearly follow-up.  Unfortunately, her husband died last year.  She also had hernia surgery done in October.  She reports significant worsening of exertional dyspnea with no orthopnea, PND or leg edema.  No significant change in her weight.  No chest discomfort.  There has been no change in her medications.   Past Medical History:  Diagnosis Date  . Anxiety   . CAD (coronary artery disease)    a. 11/2009 Abnl myoview - inferobasal ischemia;  b. 12/2009 PCI/DES to pLAD w/ 3x15 mm Promus DES;  c. Relook Cath 01/08/2010: patent stent, nonobs dzs. Relook cath 02/2012: patent LAD stent with mild disease.  . Depression   . GERD (gastroesophageal reflux disease)   . HLD (hyperlipidemia)   . Hypertensive heart disease   . Hypothyroidism   . Mild Carotid Arterial Disease    a. 04/2015 Carotid U/S: 1-39% bilat ICA stenosis.  . Mild mitral regurgitation    a. 08/2015 Echo: Ef 55-60%, no rwma, mild MR.    Past Surgical History:  Procedure Laterality Date  . CARDIAC  CATHETERIZATION  12/2009  . CARDIAC CATHETERIZATION N/A 06/03/2016   Procedure: Left Heart Cath and Coronary Angiography;  Surgeon: Lorretta Harp, MD;  Location: [facility];  Service: Cardiovascular;  Laterality: N/A;  . CESAREAN SECTION     several   . CORONARY ANGIOPLASTY WITH STENT PLACEMENT  12/2009   DES to proximal LAD  . CORONARY STENT PLACEMENT    . LEFT HEART CATHETERIZATION WITH CORONARY ANGIOGRAM N/A 03/20/2012   Procedure: LEFT HEART CATHETERIZATION WITH CORONARY ANGIOGRAM;  Surgeon: Hillary Bow, MD;  Location: [facility] CATH LAB;  Service: Cardiovascular;  Laterality: N/A;     Current Outpatient Medications  Medication Sig Dispense Refill  . amLODipine (NORVASC) 10 MG tablet Take 10 mg by mouth daily.    Marland Kitchen aspirin 81 MG EC tablet Take 1 tablet (81 mg total) by mouth daily.    . cetirizine (ZYRTEC) 10 MG tablet Take 10 mg by mouth daily.    . fluticasone (FLONASE) 50 MCG/ACT nasal spray Place 2 sprays into both nostrils every morning.     Marland Kitchen levothyroxine (SYNTHROID, LEVOTHROID) 50 MCG tablet Take 50 mcg by mouth daily.     . Menthol (BIOFREEZE) 10 % AERO Apply 1 application topically 2 (two) times daily as needed (for pain).    . metoprolol tartrate (LOPRESSOR) 50 MG tablet Take 1 tablet (50 mg total) by mouth 2 (two) times daily. 60 tablet 6  . Multiple Vitamins-Minerals (EYE VITAMINS) CAPS Take 1  capsule by mouth daily.    . nitroGLYCERIN (NITROSTAT) 0.4 MG SL tablet Place 1 tablet (0.4 mg total) under the tongue every 5 (five) minutes as needed. For chest pain 25 tablet 3  . omeprazole (PRILOSEC) 40 MG capsule Take 40 mg by mouth 2 (two) times daily.     . ondansetron (ZOFRAN) 4 MG tablet Take 4 mg by mouth every 8 (eight) hours as needed for nausea or vomiting.     Marland Kitchen rOPINIRole (REQUIP) 0.5 MG tablet Take 0.25 mg by mouth at bedtime.     . simvastatin (ZOCOR) 20 MG tablet Take 1 tablet (20 mg total) by mouth every evening. 90 tablet 1  . traMADol (ULTRAM) 50 MG  tablet Take by mouth.    . vitamin B-12 (CYANOCOBALAMIN) 1000 MCG tablet Take 1,000 mcg by mouth daily.     No current facility-administered medications for this visit.     Allergies:   Codeine; Sulfa antibiotics; Sulfonamide derivatives; Wellbutrin [bupropion]; and Zolpidem    Social History:  The patient  reports that  has never smoked. she has never used smokeless tobacco. She reports that she does not drink alcohol or use drugs.   Family History:  The patient's family history includes Heart failure in her mother.    ROS:  Please see the history of present illness.   Otherwise, review of systems are positive for none.   All other systems are reviewed and negative.    PHYSICAL EXAM: VS:  BP 122/74   Pulse 84   Ht 5\' 4"  (1.626 m)   Wt 173 lb (78.5 kg)   BMI 29.70 kg/m  , BMI Body mass index is 29.7 kg/m. GEN: Well nourished, well developed, in no acute distress  HEENT: normal  Neck: no JVD, carotid bruits, or masses Cardiac: RRR; no rubs, or gallops,no edema . 2/6 systolic ejection murmur in the aortic area. Respiratory:  clear to auscultation bilaterally, normal work of breathing GI: soft, nontender, nondistended, + BS MS: no deformity or atrophy  Skin: warm and dry, no rash Neuro:  Strength and sensation are intact Psych: euthymic mood, full affect   EKG:  EKG is ordered today. The ekg ordered today demonstrates normal sinus rhythm with LVH and repolarization abnormalities.  Anterolateral T wave inversion suggestive of ischemia.  These changes are new.   Recent Labs: No results found for requested labs within last 8760 hours.    Lipid Panel    Component Value Date/Time   CHOL 168 06/01/2016 0333   TRIG 145 06/01/2016 0333   HDL 55 06/01/2016 0333   CHOLHDL 3.1 06/01/2016 0333   VLDL 29 06/01/2016 0333   LDLCALC 84 06/01/2016 0333      Wt Readings from Last 3 Encounters:  12/02/17 173 lb (78.5 kg)  10/29/16 177 lb (80.3 kg)  06/19/16 171 lb 9.6 oz (77.8  kg)       No flowsheet data found.    ASSESSMENT AND PLAN:  1.  Coronary artery disease involving native coronary arteries with other forms of angina:  significant worsening of exertional dyspnea without chest pain could be angina equivalent.  The patient did have mild to moderate disease on most recent cardiac catheterization in 2017.  EKG today shows new anterolateral T wave changes.  I recommend evaluation with a pharmacologic nuclear stress test.  She is not able to exercise on a treadmill.  2.  Cardiac murmur with increased dyspnea: I requested an echocardiogram.  3. Essential hypertension: Blood pressure is  well controlled on current medications.  4. Hyperlipidemia: Continue treatment with simvastatin.  This is being managed by her primary care physician.  I recommend a target LDL of less than 70.    Disposition:   FU with me in 1 year  Signed,  Kathlyn Sacramento, MD  12/02/2017 8:35 AM    [place]

## 2017-12-02 ENCOUNTER — Ambulatory Visit (INDEPENDENT_AMBULATORY_CARE_PROVIDER_SITE_OTHER): Payer: Medicare HMO | Admitting: Cardiovascular Disease

## 2017-12-02 ENCOUNTER — Encounter: Payer: Self-pay | Admitting: Cardiovascular Disease

## 2017-12-02 VITALS — BP 122/74 | HR 84 | Ht 64.0 in | Wt 173.0 lb

## 2017-12-02 DIAGNOSIS — E785 Hyperlipidemia, unspecified: Secondary | ICD-10-CM

## 2017-12-02 DIAGNOSIS — R011 Cardiac murmur, unspecified: Secondary | ICD-10-CM | POA: Diagnosis not present

## 2017-12-02 DIAGNOSIS — I25118 Atherosclerotic heart disease of native coronary artery with other forms of angina pectoris: Secondary | ICD-10-CM

## 2017-12-02 DIAGNOSIS — R0602 Shortness of breath: Secondary | ICD-10-CM | POA: Diagnosis not present

## 2017-12-02 DIAGNOSIS — I10 Essential (primary) hypertension: Secondary | ICD-10-CM

## 2017-12-02 NOTE — Patient Instructions (Signed)
Medication Instructions: Your physician recommends that you continue on your current medications as directed. Please refer to the Current Medication list given to you today.  If you need a refill on your cardiac medications before your next appointment, please call your pharmacy.   Procedures/Testing: Your physician has requested that you have an echocardiogram. Echocardiography is a painless test that uses sound waves to create images of your heart. It provides your doctor with information about the size and shape of your heart and how well your heart's chambers and valves are working. This procedure takes approximately one hour. There are no restrictions for this procedure. This will take place at [address], [address].  Your physician has requested that you have a lexiscan myoview. For further information please visit [email or web address]. Please follow instruction sheet, as given. This will take place at [place], [place] wants you to follow-up in: 12 months with Dr. Fletcher Anon. You will receive a reminder letter in the mail two months in advance. If you don't receive a letter, please call our office at [phone] to schedule this follow-up appointment.    Thank you for choosing [facility] at [facility]!!

## 2017-12-15 ENCOUNTER — Other Ambulatory Visit (HOSPITAL_COMMUNITY): Payer: Medicare HMO

## 2017-12-17 ENCOUNTER — Telehealth (HOSPITAL_COMMUNITY): Payer: Self-pay

## 2017-12-17 NOTE — Telephone Encounter (Signed)
Encounter complete. 

## 2017-12-19 ENCOUNTER — Ambulatory Visit (HOSPITAL_COMMUNITY): Payer: Medicare HMO

## 2017-12-19 ENCOUNTER — Ambulatory Visit (HOSPITAL_COMMUNITY): Payer: Medicare HMO | Attending: Cardiovascular Disease

## 2017-12-19 ENCOUNTER — Other Ambulatory Visit: Payer: Self-pay

## 2017-12-19 ENCOUNTER — Telehealth (HOSPITAL_COMMUNITY): Payer: Self-pay | Admitting: Cardiovascular Disease

## 2017-12-19 DIAGNOSIS — E785 Hyperlipidemia, unspecified: Secondary | ICD-10-CM | POA: Insufficient documentation

## 2017-12-19 DIAGNOSIS — I350 Nonrheumatic aortic (valve) stenosis: Secondary | ICD-10-CM | POA: Diagnosis not present

## 2017-12-19 DIAGNOSIS — I2510 Atherosclerotic heart disease of native coronary artery without angina pectoris: Secondary | ICD-10-CM | POA: Diagnosis not present

## 2017-12-19 DIAGNOSIS — R011 Cardiac murmur, unspecified: Secondary | ICD-10-CM | POA: Diagnosis not present

## 2017-12-19 DIAGNOSIS — Z8249 Family history of ischemic heart disease and other diseases of the circulatory system: Secondary | ICD-10-CM | POA: Diagnosis not present

## 2017-12-19 DIAGNOSIS — I358 Other nonrheumatic aortic valve disorders: Secondary | ICD-10-CM | POA: Insufficient documentation

## 2017-12-19 DIAGNOSIS — I119 Hypertensive heart disease without heart failure: Secondary | ICD-10-CM | POA: Diagnosis not present

## 2017-12-19 NOTE — Telephone Encounter (Signed)
12/19/2017 10:25 AM Phone (Outgoing) [name] (Self)  Remove  Completed - Patient was in the office to have and Echo done at 10:30am . Informed tech Beth to direct patient to Check- out to r/s myoview due to camera being down.RG    By Verdene Rio

## 2017-12-24 ENCOUNTER — Telehealth (HOSPITAL_COMMUNITY): Payer: Self-pay

## 2017-12-24 NOTE — Telephone Encounter (Signed)
Encounter complete. 

## 2017-12-26 ENCOUNTER — Ambulatory Visit (HOSPITAL_COMMUNITY)
Admission: RE | Admit: 2017-12-26 | Discharge: 2017-12-26 | Disposition: A | Discharge status: Home / Self Care | Payer: Medicare HMO | Source: Ambulatory Visit | Attending: Cardiovascular Disease | Admitting: Cardiovascular Disease

## 2017-12-26 DIAGNOSIS — R0602 Shortness of breath: Secondary | ICD-10-CM | POA: Diagnosis not present

## 2017-12-26 LAB — MYOCARDIAL PERFUSION IMAGING
CHL CUP NUCLEAR SDS: 5
CHL CUP NUCLEAR SRS: 0
CHL CUP RESTING HR STRESS: 62 {beats}/min
CSEPPHR: 113 {beats}/min
LV dias vol: 51 mL (ref 46–106)
LVSYSVOL: 11 mL
NUC STRESS TID: 0.97
SSS: 5

## 2017-12-26 MED ORDER — REGADENOSON 0.4 MG/5ML IV SOLN
0.4000 mg | Freq: Once | INTRAVENOUS | Status: AC
  Administered 2017-12-26: 0.4 mg via INTRAVENOUS

## 2017-12-26 MED ORDER — AMINOPHYLLINE 25 MG/ML IV SOLN
75.0000 mg | Freq: Once | INTRAVENOUS | Status: AC
  Administered 2017-12-26: 75 mg via INTRAVENOUS

## 2017-12-26 MED ORDER — TECHNETIUM TC 99M TETROFOSMIN IV KIT
31.1000 | PACK | Freq: Once | INTRAVENOUS | Status: AC | PRN
  Administered 2017-12-26: 31.1 via INTRAVENOUS
  Filled 2017-12-26: qty 32

## 2017-12-26 MED ORDER — TECHNETIUM TC 99M TETROFOSMIN IV KIT
10.9000 | PACK | Freq: Once | INTRAVENOUS | Status: AC | PRN
  Administered 2017-12-26: 10.9 via INTRAVENOUS
  Filled 2017-12-26: qty 11

## 2018-12-18 ENCOUNTER — Telehealth: Payer: Self-pay | Admitting: *Deleted

## 2018-12-18 NOTE — Telephone Encounter (Signed)
Left a message for the patient to call back concerning her appointment on 3/31 to either reschedule or schedule an evisit.

## 2018-12-18 NOTE — Telephone Encounter (Signed)
Patient refused an evisit as this time. She would prefer to wait until she can be seen in person.     Primary Cardiologist:  Kathlyn Sacramento, MD   Patient contacted.  History reviewed.  No symptoms to suggest any unstable cardiac conditions.  Based on discussion, with current pandemic situation, we will be postponing this appointment for Janice Glass with a plan for f/u in 6-12 wks or sooner if feasible/necessary.  If symptoms change, she has been instructed to contact our office.   Routing to C19 CANCEL pool for tracking (P CV DIV CV19 CANCEL - reason for visit "other.") and assigning priority (1 = 4-6 wks, 2 = 6-12 wks, 3 = >12 wks).   Ricci Barker, RN  12/18/2018 12:11 PM         .

## 2018-12-22 ENCOUNTER — Ambulatory Visit: Payer: Medicare HMO | Admitting: Cardiovascular Disease

## 2019-02-05 ENCOUNTER — Telehealth: Payer: Self-pay

## 2019-02-05 NOTE — Telephone Encounter (Signed)

## 2019-02-08 ENCOUNTER — Telehealth: Payer: Self-pay | Admitting: Cardiovascular Disease

## 2019-02-09 ENCOUNTER — Encounter: Payer: Self-pay | Admitting: Cardiovascular Disease

## 2019-02-09 ENCOUNTER — Telehealth (INDEPENDENT_AMBULATORY_CARE_PROVIDER_SITE_OTHER): Payer: Medicare HMO | Admitting: Cardiovascular Disease

## 2019-02-09 VITALS — BP 138/78 | HR 79 | Ht 64.0 in | Wt 180.0 lb

## 2019-02-09 DIAGNOSIS — I359 Nonrheumatic aortic valve disorder, unspecified: Secondary | ICD-10-CM

## 2019-02-09 DIAGNOSIS — I2510 Atherosclerotic heart disease of native coronary artery without angina pectoris: Secondary | ICD-10-CM | POA: Diagnosis not present

## 2019-02-09 NOTE — Progress Notes (Signed)
Virtual Visit via Telephone Note   This visit type was conducted due to national recommendations for restrictions regarding the COVID-19 Pandemic (e.g. social distancing) in an effort to limit this patient's exposure and mitigate transmission in our community.  Due to her co-morbid illnesses, this patient is at least at moderate risk for complications without adequate follow up.  This format is felt to be most appropriate for this patient at this time.  The patient did not have access to video technology/had technical difficulties with video requiring transitioning to audio format only (telephone).  All issues noted in this document were discussed and addressed.  No physical exam could be performed with this format.  Please refer to the patient's chart for her  consent to telehealth for [facility].   Date:  02/09/2019   ID:  Janice Glass, DOB 10-Aug-1941, MRN [id number]  Patient Location: Home Provider Location: Office  PCP:  Raina Mina., MD  Cardiologist:  Kathlyn Sacramento, MD  Electrophysiologist:  None   Evaluation Performed:  Follow-Up Visit  Chief Complaint: [place] 2 weeks ago.  History of Present Illness:    Janice Glass is a 78 y.o. female was reached via phone for a follow-up visit.  She has known history of coronary artery disease with previous drug-eluting stent placement to the LAD.  Cardiac catheterization in September 2017 showed patent LAD stent with moderate mid LAD stenosis beyond the stented segment.   Most recent ischemic cardiac work-up was in 2019 which included a normal Lexiscan Myoview. She was also noted to have a cardiac murmur last year.  An echocardiogram showed normal EF with mild aortic stenosis.  She has been doing reasonably well with no chest pain, shortness of breath, dizziness or syncope.  She reports that she fell 2 weeks ago at [employer] parking lot.  She is certain that she did not have syncope and she simply tripped.  She bruised her eye  but did not have to go to the emergency room.  The patient does not have symptoms concerning for COVID-19 infection (fever, chills, cough, or new shortness of breath).    Past Medical History:  Diagnosis Date   Anxiety    CAD (coronary artery disease)    a. 11/2009 Abnl myoview - inferobasal ischemia;  b. 12/2009 PCI/DES to pLAD w/ 3x15 mm Promus DES;  c. Relook Cath 01/08/2010: patent stent, nonobs dzs. Relook cath 02/2012: patent LAD stent with mild disease.   Depression    GERD (gastroesophageal reflux disease)    HLD (hyperlipidemia)    Hypertensive heart disease    Hypothyroidism    Mild Carotid Arterial Disease    a. 04/2015 Carotid U/S: 1-39% bilat ICA stenosis.   Mild mitral regurgitation    a. 08/2015 Echo: Ef 55-60%, no rwma, mild MR.   Past Surgical History:  Procedure Laterality Date   CARDIAC CATHETERIZATION  12/2009   CARDIAC CATHETERIZATION N/A 06/03/2016   Procedure: Left Heart Cath and Coronary Angiography;  Surgeon: Lorretta Harp, MD;  Location: [place] CV [facility];  Service: Cardiovascular;  Laterality: N/A;   CESAREAN SECTION     several    CORONARY ANGIOPLASTY WITH STENT PLACEMENT  12/2009   DES to proximal LAD   CORONARY STENT PLACEMENT     LEFT HEART CATHETERIZATION WITH CORONARY ANGIOGRAM N/A 03/20/2012   Procedure: LEFT HEART CATHETERIZATION WITH CORONARY ANGIOGRAM;  Surgeon: Hillary Bow, MD;  Location: [facility] CATH LAB;  Service: Cardiovascular;  Laterality: N/A;  Current Meds  Medication Sig   amLODipine (NORVASC) 10 MG tablet Take 10 mg by mouth daily.   aspirin 81 MG EC tablet Take 1 tablet (81 mg total) by mouth daily.   cetirizine (ZYRTEC) 10 MG tablet Take 10 mg by mouth daily.   fluticasone (FLONASE) 50 MCG/ACT nasal spray Place 2 sprays into both nostrils every morning.    levothyroxine (SYNTHROID, LEVOTHROID) 50 MCG tablet Take 50 mcg by mouth daily.    metoprolol tartrate (LOPRESSOR) 50 MG tablet Take 1 tablet (50 mg  total) by mouth 2 (two) times daily.   Multiple Vitamins-Minerals (EYE VITAMINS) CAPS Take 1 capsule by mouth daily.   nitroGLYCERIN (NITROSTAT) 0.4 MG SL tablet Place 1 tablet (0.4 mg total) under the tongue every 5 (five) minutes as needed. For chest pain   omeprazole (PRILOSEC) 40 MG capsule Take 40 mg by mouth 2 (two) times daily.    ondansetron (ZOFRAN) 4 MG tablet Take 4 mg by mouth every 8 (eight) hours as needed for nausea or vomiting.    polyethylene glycol (MIRALAX / GLYCOLAX) 17 g packet Take by mouth.   potassium chloride (K-DUR) 10 MEQ tablet Take 10 mEq by mouth daily.    pregabalin (LYRICA) 75 MG capsule Take 75 mg by mouth daily.   simvastatin (ZOCOR) 20 MG tablet Take 1 tablet (20 mg total) by mouth every evening.   traMADol (ULTRAM) 50 MG tablet Take by mouth.   vitamin B-12 (CYANOCOBALAMIN) 1000 MCG tablet Take 1,000 mcg by mouth daily.     Allergies:   Codeine; Gabapentin; Sulfa antibiotics; Sulfonamide derivatives; Wellbutrin [bupropion]; and Zolpidem   Social History   Tobacco Use   Smoking status: Never Smoker   Smokeless tobacco: Never Used   Tobacco comment: no tobacco   Substance Use Topics   Alcohol use: No   Drug use: No     Family Hx: The patient's family history includes Heart failure in her mother.  ROS:   Please see the history of present illness.     All other systems reviewed and are negative.   Prior CV studies:   The following studies were reviewed today:  Reviewed results of stress testing and echocardiogram from last year with her.  Labs/Other Tests and Data Reviewed:    EKG:  No ECG reviewed.  Recent Labs: No results found for requested labs within last 8760 hours.   Recent Lipid Panel Lab Results  Component Value Date/Time   CHOL 168 06/01/2016 03:33 AM   TRIG 145 06/01/2016 03:33 AM   HDL 55 06/01/2016 03:33 AM   CHOLHDL 3.1 06/01/2016 03:33 AM   LDLCALC 84 06/01/2016 03:33 AM    Wt Readings from Last 3  Encounters:  02/09/19 180 lb (81.6 kg)  12/26/17 173 lb (78.5 kg)  12/02/17 173 lb (78.5 kg)     Objective:    Vital Signs:  BP 138/78    Pulse 79    Ht 5\' 4"  (1.626 m)    Wt 180 lb (81.6 kg)    SpO2 98%    BMI 30.90 kg/m    VITAL SIGNS:  reviewed  ASSESSMENT & PLAN:    1.  Coronary artery disease involving native coronary arteries without angina: She is doing well overall with no anginal symptoms.  Nuclear stress test last year was normal.  Continue medical therapy.    2.    Aortic stenosis: This was mild on echocardiogram from March 2019.  Repeat study in March 2021.  3.  Essential hypertension: Blood pressure is well controlled on current medications.  4. Hyperlipidemia: Continue treatment with simvastatin.  This is being managed by her primary care physician.  I recommend a target LDL of less than 70.  5.  Recent fall, no syncope.  She was started on Lyrica in March and I wonder if this has something to do with it.   COVID-19 Education: The signs and symptoms of COVID-19 were discussed with the patient and how to seek care for testing (follow up with PCP or arrange E-visit).  The importance of social distancing was discussed today.  Time:   Today, I have spent 13 minutes with the patient with telehealth technology discussing the above problems.     Medication Adjustments/Labs and Tests Ordered: Current medicines are reviewed at length with the patient today.  Concerns regarding medicines are outlined above.   Tests Ordered: No orders of the defined types were placed in this encounter.   Medication Changes: No orders of the defined types were placed in this encounter.   Disposition:  Follow up in 6 month(s)  Signed, Kathlyn Sacramento, MD  02/09/2019 12:12 PM    [place]

## 2019-02-09 NOTE — Patient Instructions (Signed)
Medication Instructions:  No change in medications If you need a refill on your cardiac medications before your next appointment, please call your pharmacy.   Lab work: None If you have labs (blood work) drawn today and your tests are completely normal, you will receive your results only by: Marland Kitchen MyChart Message (if you have MyChart) OR . A paper copy in the mail If you have any lab test that is abnormal or we need to change your treatment, we will call you to review the results.  Testing/Procedures: None  Follow-Up: At [facility], you and your health needs are our priority.  As part of our continuing mission to provide you with exceptional heart care, we have created designated Provider Care Teams.  These Care Teams include your primary Cardiologist (physician) and Advanced Practice Providers (APPs -  Physician Assistants and Nurse Practitioners) who all work together to provide you with the care you need, when you need it. You will need a follow up appointment in 6 months.  Please call our office 2 months in advance to schedule this appointment.  You may see Kathlyn Sacramento, MD or one of the following Advanced Practice Providers on your designated Care Team:   Kerin Ransom, PA-C Roby Lofts, [place] . Sande Rives, PA-C

## 2019-03-23 NOTE — Telephone Encounter (Signed)
Opened in error

## 2019-03-30 ENCOUNTER — Emergency Department (HOSPITAL_COMMUNITY)
Admission: EM | Admit: 2019-03-30 | Discharge: 2019-03-30 | Disposition: A | Discharge status: Home / Self Care | Payer: Medicare HMO | Attending: Emergency Medicine | Admitting: Emergency Medicine

## 2019-03-30 ENCOUNTER — Encounter (HOSPITAL_COMMUNITY): Payer: Self-pay | Admitting: *Deleted

## 2019-03-30 ENCOUNTER — Other Ambulatory Visit: Payer: Self-pay

## 2019-03-30 DIAGNOSIS — I10 Essential (primary) hypertension: Secondary | ICD-10-CM | POA: Insufficient documentation

## 2019-03-30 DIAGNOSIS — R110 Nausea: Secondary | ICD-10-CM | POA: Diagnosis not present

## 2019-03-30 DIAGNOSIS — R197 Diarrhea, unspecified: Secondary | ICD-10-CM | POA: Diagnosis not present

## 2019-03-30 DIAGNOSIS — R5381 Other malaise: Secondary | ICD-10-CM | POA: Insufficient documentation

## 2019-03-30 DIAGNOSIS — Z79899 Other long term (current) drug therapy: Secondary | ICD-10-CM | POA: Insufficient documentation

## 2019-03-30 DIAGNOSIS — E039 Hypothyroidism, unspecified: Secondary | ICD-10-CM | POA: Insufficient documentation

## 2019-03-30 DIAGNOSIS — I2510 Atherosclerotic heart disease of native coronary artery without angina pectoris: Secondary | ICD-10-CM | POA: Diagnosis not present

## 2019-03-30 LAB — URINALYSIS, ROUTINE W REFLEX MICROSCOPIC
Bilirubin Urine: NEGATIVE
Glucose, UA: NEGATIVE mg/dL
Hgb urine dipstick: NEGATIVE
Ketones, ur: NEGATIVE mg/dL
Nitrite: NEGATIVE
Protein, ur: NEGATIVE mg/dL
Specific Gravity, Urine: 1.01 (ref 1.005–1.030)
pH: 6 (ref 5.0–8.0)

## 2019-03-30 LAB — BASIC METABOLIC PANEL
Anion gap: 9 (ref 5–15)
BUN: 6 mg/dL — ABNORMAL LOW (ref 8–23)
CO2: 28 mmol/L (ref 22–32)
Calcium: 9.1 mg/dL (ref 8.9–10.3)
Chloride: 100 mmol/L (ref 98–111)
Creatinine, Ser: 1.06 mg/dL — ABNORMAL HIGH (ref 0.44–1.00)
GFR calc Af Amer: 59 mL/min — ABNORMAL LOW (ref >60)
GFR calc non Af Amer: 51 mL/min — ABNORMAL LOW (ref >60)
Glucose, Bld: 104 mg/dL — ABNORMAL HIGH (ref 70–99)
Potassium: 3.5 mmol/L (ref 3.5–5.1)
Sodium: 137 mmol/L (ref 135–145)

## 2019-03-30 LAB — CBC
HCT: 42.2 % (ref 36.0–46.0)
Hemoglobin: 13.7 g/dL (ref 12.0–15.0)
MCH: 32.9 pg (ref 26.0–34.0)
MCHC: 32.5 g/dL (ref 30.0–36.0)
MCV: 101.4 fL — ABNORMAL HIGH (ref 80.0–100.0)
Platelets: 246 10*3/uL (ref 150–400)
RBC: 4.16 MIL/uL (ref 3.87–5.11)
RDW: 12 % (ref 11.5–15.5)
WBC: 9.3 10*3/uL (ref 4.0–10.5)
nRBC: 0 % (ref 0.0–0.2)

## 2019-03-30 LAB — CBG MONITORING, ED: Glucose-Capillary: 100 mg/dL — ABNORMAL HIGH (ref 70–99)

## 2019-03-30 MED ORDER — SODIUM CHLORIDE 0.9% FLUSH: 3.0000 mL | Freq: Once | INTRAVENOUS | Status: DC

## 2019-03-30 MED ORDER — METOPROLOL TARTRATE 25 MG PO TABS
50.0000 mg | ORAL_TABLET | Freq: Once | ORAL | Status: AC
  Administered 2019-03-30: 50 mg via ORAL
  Filled 2019-03-30: qty 2

## 2019-03-30 MED ORDER — PROMETHAZINE HCL 25 MG PO TABS: 25.0000 mg | ORAL_TABLET | Freq: Four times a day (QID) | ORAL | 0 refills | Status: AC | PRN

## 2019-03-30 MED ORDER — FAMOTIDINE 20 MG PO TABS: 20.0000 mg | ORAL_TABLET | Freq: Two times a day (BID) | ORAL | 0 refills | Status: DC

## 2019-03-30 NOTE — Discharge Instructions (Signed)
See your family doctor for recheck in the next 3 to 5 days.  Continue your home medications.  Start taking Pepcid twice a day as prescribed.  Take Phenergan if needed for nausea.  Return to the emergency department if new or worsening symptoms develop.

## 2019-03-30 NOTE — ED Provider Notes (Signed)
[place] EMERGENCY DEPARTMENT Provider Note   CSN: [id number] Arrival date & time: 03/30/19  1157    History   Chief Complaint Chief Complaint  Patient presents with  . Hypertension    HPI Janice Glass is a 78 y.o. female.     HPI Patient reports he is just not felt very well for a few days.  She reports that she had diarrhea yesterday.  That has resolved today.  She reports that she has felt nauseated since yesterday.  She has Zofran at home to take.  She reports she took a couple this morning but did not get much relief and therefore went to urgent care.  She reports while at urgent care they were concerned for her blood pressure and general condition and encouraged her to come to the emergency department for further assessment.  Patient denies she has had a fever.  Reports she has felt pretty fatigued for a day or 2.  She reports that her chest has been hurting for a number of days if there is pressure put on it.  She reports she has a 9lb cat that lays on her chest and she thinks that might be the cause.  She denies that she feels short of breath or that she has any coughing.  She reports her upper abdomen just feels more nauseated but without any specific pain really.  She reports that since the Phenergan in urgent care she does feel better in terms of her nausea.  Now she is wondering if she just feels bad because she has not had anything to eat.  He did not take her blood pressure medications this morning.  She also did not take her Lyrica because yesterday she wondered if maybe it was causing her symptoms. Past Medical History:  Diagnosis Date  . Anxiety   . CAD (coronary artery disease)    a. 11/2009 Abnl myoview - inferobasal ischemia;  b. 12/2009 PCI/DES to pLAD w/ 3x15 mm Promus DES;  c. Relook Cath 01/08/2010: patent stent, nonobs dzs. Relook cath 02/2012: patent LAD stent with mild disease.  . Depression   . GERD (gastroesophageal reflux disease)   .  HLD (hyperlipidemia)   . Hypertensive heart disease   . Hypothyroidism   . Mild Carotid Arterial Disease    a. 04/2015 Carotid U/S: 1-39% bilat ICA stenosis.  . Mild mitral regurgitation    a. 08/2015 Echo: Ef 55-60%, no rwma, mild MR.    Patient Active Problem List   Diagnosis Date Noted  . Coronary artery disease due to lipid rich plaque   . DOE (dyspnea on exertion) 05/31/2016  . Chest pain 05/31/2016  . Hypertensive heart disease without heart failure   . HLD (hyperlipidemia)   . Thyroid activity decreased   . Atypical angina ([place])   . Pre-operative cardiovascular examination 09/01/2014  . Murmur 04/20/2012  . Unstable angina ([place]) 03/18/2012  . Dizziness 11/21/2011  . CAD S/P percutaneous coronary angioplasty   . TIA 01/19/2010  . HYPOTHYROIDISM 01/02/2010  . Hyperlipidemia 01/02/2010  . ANXIETY 01/02/2010  . Essential hypertension 01/02/2010  . GERD 01/02/2010    Past Surgical History:  Procedure Laterality Date  . CARDIAC CATHETERIZATION  12/2009  . CARDIAC CATHETERIZATION N/A 06/03/2016   Procedure: Left Heart Cath and Coronary Angiography;  Surgeon: Lorretta Harp, MD;  Location: [place] CV LAB;  Service: Cardiovascular;  Laterality: N/A;  . CESAREAN SECTION     several   . CORONARY ANGIOPLASTY  WITH STENT PLACEMENT  12/2009   DES to proximal LAD  . CORONARY STENT PLACEMENT    . LEFT HEART CATHETERIZATION WITH CORONARY ANGIOGRAM N/A 03/20/2012   Procedure: LEFT HEART CATHETERIZATION WITH CORONARY ANGIOGRAM;  Surgeon: Hillary Bow, MD;  Location: [facility] CATH LAB;  Service: Cardiovascular;  Laterality: N/A;     OB History   No obstetric history on file.      Home Medications    Prior to Admission medications   Medication Sig Start Date End Date Taking? Authorizing Provider  acetaminophen (TYLENOL) 500 MG tablet Take 500-1,000 mg by mouth every 8 (eight) hours as needed for mild pain or headache.   Yes [provider]  amLODipine (NORVASC) 5 MG  tablet Take 5 mg by mouth daily. 02/23/19  Yes [provider]  anti-nausea (EMETROL) solution Take 10 mLs by mouth every 15 (fifteen) minutes as needed for nausea or vomiting.   Yes [provider]  aspirin 81 MG EC tablet Take 1 tablet (81 mg total) by mouth daily. 08/30/14  Yes Wellington Hampshire, MD  fluticasone (FLONASE) 50 MCG/ACT nasal spray Place 2 sprays into both nostrils every morning.  04/09/16  Yes [provider]  levothyroxine (SYNTHROID, LEVOTHROID) 50 MCG tablet Take 50 mcg by mouth daily.    Yes [provider]  loratadine (CLARITIN) 10 MG tablet Take 10 mg by mouth daily.   Yes [provider]  metoprolol tartrate (LOPRESSOR) 50 MG tablet Take 1 tablet (50 mg total) by mouth 2 (two) times daily. 06/19/16  Yes Barrett, Evelene Croon, PA-C  Multiple Vitamins-Minerals (EYE VITAMINS) CAPS Take 1 capsule by mouth daily.   Yes [provider]  nitroGLYCERIN (NITROSTAT) 0.4 MG SL tablet Place 1 tablet (0.4 mg total) under the tongue every 5 (five) minutes as needed. For chest pain Patient taking differently: Place 0.4 mg under the tongue every 5 (five) minutes as needed for chest pain.  06/19/16  Yes Barrett, Evelene Croon, PA-C  omeprazole (PRILOSEC) 40 MG capsule Take 40 mg by mouth 2 (two) times daily.  05/07/11  Yes [provider]  ondansetron (ZOFRAN) 4 MG tablet Take 4 mg by mouth every 8 (eight) hours as needed for nausea or vomiting.  03/28/16  Yes [provider]  polyethylene glycol (MIRALAX / GLYCOLAX) 17 g packet Take 17 g by mouth daily as needed for mild constipation.  02/11/18  Yes [provider]  potassium chloride (K-DUR) 10 MEQ tablet Take 10 mEq by mouth daily.  06/03/18  Yes [provider]  pregabalin (LYRICA) 75 MG capsule Take 75 mg by mouth at bedtime.    Yes [provider]  simvastatin (ZOCOR) 20 MG tablet Take 1 tablet (20 mg total) by mouth every evening. Patient taking differently:  Take 20 mg by mouth at bedtime.  10/23/12  Yes Hillary Bow, MD  traMADol (ULTRAM) 50 MG tablet Take 50 mg by mouth 2 (two) times a day.  08/25/17  Yes [provider]  vitamin B-12 (CYANOCOBALAMIN) 1000 MCG tablet Take 1,000 mcg by mouth daily.   Yes [provider]  VITAMIN D PO Take 1 tablet by mouth daily with breakfast.   Yes [provider]  famotidine (PEPCID) 20 MG tablet Take 1 tablet (20 mg total) by mouth 2 (two) times daily. 03/30/19   Charlesetta Shanks, MD  promethazine (PHENERGAN) 25 MG tablet Take 1 tablet (25 mg total) by mouth every 6 (six) hours as needed for nausea or  vomiting. 03/30/19   Charlesetta Shanks, MD    Family History Family History  Problem Relation Age of Onset  . Heart failure Mother     Social History Social History   Tobacco Use  . Smoking status: Never Smoker  . Smokeless tobacco: Never Used  . Tobacco comment: no tobacco   Substance Use Topics  . Alcohol use: No  . Drug use: No     Allergies   Codeine, Gabapentin, Sulfa antibiotics, Sulfonamide derivatives, Wellbutrin [bupropion], and Zolpidem   Review of Systems Review of Systems 10 Systems reviewed and are negative for acute change except as noted in the HPI.   Physical Exam Updated Vital Signs BP (!) 180/82 (BP Location: Right Arm)   Pulse 70   Temp 97.8 F (36.6 C) (Oral)   Resp (!) 21   SpO2 96%   Physical Exam Constitutional:      Comments: Patient is alert and nontoxic.  Her mental status is clear.  No respiratory distress.  She does have some mild central obesity but generally good physical condition.  HENT:     Head: Normocephalic and atraumatic.     Mouth/Throat:     Mouth: Mucous membranes are moist.     Pharynx: Oropharynx is clear.  Eyes:     Extraocular Movements: Extraocular movements intact.     Pupils: Pupils are equal, round, and reactive to light.  Neck:     Musculoskeletal: Neck supple.  Cardiovascular:     Rate and Rhythm: Normal  rate and regular rhythm.     Pulses: Normal pulses.     Heart sounds: Normal heart sounds.  Pulmonary:     Effort: Pulmonary effort is normal.     Breath sounds: Normal breath sounds.     Comments: Patient has chest wall tenderness to compression diffusely over the lower ribs.  Slightly more on the left than the right.  Breasts themselves do not have any erythema or induration.  She does identify little bit of tenderness also within the breast tissue but this is really most producible by compression over the chest wall laterally.  No crepitus. Chest:     Chest wall: Tenderness present.  Abdominal:     General: There is no distension.     Palpations: Abdomen is soft.     Tenderness: There is no abdominal tenderness. There is no guarding.  Musculoskeletal: Normal range of motion.        General: No swelling or tenderness.     Right lower leg: No edema.     Left lower leg: No edema.     Comments: No peripheral edema.  Calves are soft and nontender.  Skin:    General: Skin is warm and dry.  Neurological:     General: No focal deficit present.     Mental Status: She is oriented to person, place, and time.     Cranial Nerves: No cranial nerve deficit.     Coordination: Coordination normal.  Psychiatric:        Mood and Affect: Mood normal.      ED Treatments / Results  Labs (all labs ordered are listed, but only abnormal results are displayed) Labs Reviewed  BASIC METABOLIC PANEL - Abnormal; Notable for the following components:      Result Value   Glucose, Bld 104 (*)    BUN 6 (*)    Creatinine, Ser 1.06 (*)    GFR calc non Af Amer 51 (*)    GFR calc  Af Amer 59 (*)    All other components within normal limits  CBC - Abnormal; Notable for the following components:   MCV 101.4 (*)    All other components within normal limits  URINALYSIS, ROUTINE W REFLEX MICROSCOPIC - Abnormal; Notable for the following components:   Leukocytes,Ua SMALL (*)    Bacteria, UA RARE (*)    All  other components within normal limits  CBG MONITORING, ED - Abnormal; Notable for the following components:   Glucose-Capillary 100 (*)    All other components within normal limits    EKG EKG Interpretation  Date/Time:  Tuesday March 30 2019 14:59:18 EDT Ventricular Rate:  69 PR Interval:    QRS Duration: 154 QT Interval:  388 QTC Calculation: 416 R Axis:   -21 Text Interpretation:  Sinus rhythm Right bundle branch block Left ventricular hypertrophy Inferior infarct, old no acute ischemic appearance, no sig change frm old Confirmed by Charlesetta Shanks [phone]) on 03/30/2019 3:31:54 PM   Radiology No results found.  Procedures Procedures (including critical care time)  Medications Ordered in ED Medications  sodium chloride flush (NS) 0.9 % injection 3 mL (has no administration in time range)  metoprolol tartrate (LOPRESSOR) tablet 50 mg (50 mg Oral Given 03/30/19 1545)     Initial Impression / Assessment and Plan / ED Course  I have reviewed the triage vital signs and the nursing notes.  Pertinent labs & imaging results that were available during my care of the patient were reviewed by me and considered in my medical decision making (see chart for details).       Patient has had some generalized constitutional symptoms that seem improved at this point.  She had diarrhea yesterday but is not having ongoing diarrhea.  He has had some nausea and appetite loss.  No abdominal pain.  No fevers.  At this time will have the patient continue she has Zofran at home will add Pepcid twice daily for possible gastritis and Phenergan if needed for additional control of nausea.  Had not taken her blood pressure medications today.  She is instructed to resume her usual blood pressure medications.  She does not show any signs of endorgan damage or hypertensive urgency.  Patient stable for discharge with return precautions reviewed.  Final Clinical Impressions(s) / ED Diagnoses   Final diagnoses:   Essential hypertension  Malaise  Nausea    ED Discharge Orders         Ordered    promethazine (PHENERGAN) 25 MG tablet  Every 6 hours PRN     03/30/19 1654    famotidine (PEPCID) 20 MG tablet  2 times daily     03/30/19 1656           Charlesetta Shanks, MD 03/30/19 1658

## 2019-03-30 NOTE — ED Triage Notes (Signed)
Pt reports having nausea and fatigue today, episodes of diarrhea yesterday. Went to ucc today and given phenergan IM and sent here due to HTN. Reports hx of HTN and taking meds as prescribed.

## 2019-03-30 NOTE — ED Notes (Signed)
Stands for Ortho VS on own ability. No complaints. No concerns.

## 2019-04-23 DIAGNOSIS — E876 Hypokalemia: Secondary | ICD-10-CM

## 2019-04-23 DIAGNOSIS — I2510 Atherosclerotic heart disease of native coronary artery without angina pectoris: Secondary | ICD-10-CM

## 2019-04-23 DIAGNOSIS — L03113 Cellulitis of right upper limb: Secondary | ICD-10-CM

## 2019-04-23 DIAGNOSIS — K219 Gastro-esophageal reflux disease without esophagitis: Secondary | ICD-10-CM

## 2019-04-23 DIAGNOSIS — E119 Type 2 diabetes mellitus without complications: Secondary | ICD-10-CM

## 2019-04-23 DIAGNOSIS — E039 Hypothyroidism, unspecified: Secondary | ICD-10-CM

## 2019-04-23 DIAGNOSIS — N179 Acute kidney failure, unspecified: Secondary | ICD-10-CM

## 2019-04-23 DIAGNOSIS — E785 Hyperlipidemia, unspecified: Secondary | ICD-10-CM

## 2019-04-24 DIAGNOSIS — E039 Hypothyroidism, unspecified: Secondary | ICD-10-CM | POA: Diagnosis not present

## 2019-04-24 DIAGNOSIS — L03113 Cellulitis of right upper limb: Secondary | ICD-10-CM | POA: Diagnosis not present

## 2019-04-24 DIAGNOSIS — N179 Acute kidney failure, unspecified: Secondary | ICD-10-CM | POA: Diagnosis not present

## 2019-04-24 DIAGNOSIS — E785 Hyperlipidemia, unspecified: Secondary | ICD-10-CM | POA: Diagnosis not present

## 2019-04-25 DIAGNOSIS — E039 Hypothyroidism, unspecified: Secondary | ICD-10-CM | POA: Diagnosis not present

## 2019-04-25 DIAGNOSIS — E785 Hyperlipidemia, unspecified: Secondary | ICD-10-CM | POA: Diagnosis not present

## 2019-04-25 DIAGNOSIS — L03113 Cellulitis of right upper limb: Secondary | ICD-10-CM | POA: Diagnosis not present

## 2019-04-25 DIAGNOSIS — N179 Acute kidney failure, unspecified: Secondary | ICD-10-CM | POA: Diagnosis not present

## 2019-04-26 DIAGNOSIS — L03113 Cellulitis of right upper limb: Secondary | ICD-10-CM | POA: Diagnosis not present

## 2019-04-26 DIAGNOSIS — E785 Hyperlipidemia, unspecified: Secondary | ICD-10-CM | POA: Diagnosis not present

## 2019-04-26 DIAGNOSIS — N179 Acute kidney failure, unspecified: Secondary | ICD-10-CM | POA: Diagnosis not present

## 2019-04-26 DIAGNOSIS — E039 Hypothyroidism, unspecified: Secondary | ICD-10-CM | POA: Diagnosis not present

## 2019-05-25 ENCOUNTER — Ambulatory Visit (INDEPENDENT_AMBULATORY_CARE_PROVIDER_SITE_OTHER): Payer: Medicare HMO | Admitting: Cardiovascular Disease

## 2019-05-25 ENCOUNTER — Other Ambulatory Visit: Payer: Self-pay

## 2019-05-25 ENCOUNTER — Telehealth: Payer: Self-pay | Admitting: Cardiovascular Disease

## 2019-05-25 ENCOUNTER — Encounter: Payer: Self-pay | Admitting: Cardiovascular Disease

## 2019-05-25 VITALS — BP 177/101 | HR 77 | Temp 96.6°F | Ht 64.0 in | Wt 181.2 lb

## 2019-05-25 DIAGNOSIS — Z01818 Encounter for other preprocedural examination: Secondary | ICD-10-CM | POA: Diagnosis not present

## 2019-05-25 DIAGNOSIS — I359 Nonrheumatic aortic valve disorder, unspecified: Secondary | ICD-10-CM

## 2019-05-25 DIAGNOSIS — I25110 Atherosclerotic heart disease of native coronary artery with unstable angina pectoris: Secondary | ICD-10-CM | POA: Diagnosis not present

## 2019-05-25 DIAGNOSIS — E785 Hyperlipidemia, unspecified: Secondary | ICD-10-CM

## 2019-05-25 DIAGNOSIS — I10 Essential (primary) hypertension: Secondary | ICD-10-CM

## 2019-05-25 NOTE — Progress Notes (Signed)
Cardiology Office Note   Date:  05/25/2019   ID:  Janice Glass, DOB 1941/04/17, MRN [id number]  PCP:  Raina Mina., MD  Cardiologist:   Kathlyn Sacramento, MD   No chief complaint on file.     History of Present Illness: Janice Glass is a 78 y.o. female who presents for a followup visit regarding coronary artery disease, aortic stenosis and recent worsening of exertional dyspnea.   She has known history of coronary artery disease with previous drug-eluting stent placement to the LAD.  Cardiac catheterization in September 2017 showed patent LAD stent with moderate mid LAD stenosis beyond the stented segment.   Most recent ischemic cardiac work-up was in 2019 which included a normal Lexiscan Myoview. She was also noted to have a cardiac murmur last year.  An echocardiogram showed normal EF with mild aortic stenosis.  Over the last few months, she has experienced significant worsening of exertional dyspnea which is now happening with minimal activities.  She has not been able to do activities of daily living due to to these new symptoms.  She has no chest discomfort.  No dizziness or syncope.  No significant change in her weight or leg edema.  She feels very similar to when she had obstructive coronary artery disease.   Past Medical History:  Diagnosis Date  . Anxiety   . CAD (coronary artery disease)    a. 11/2009 Abnl myoview - inferobasal ischemia;  b. 12/2009 PCI/DES to pLAD w/ 3x15 mm Promus DES;  c. Relook Cath 01/08/2010: patent stent, nonobs dzs. Relook cath 02/2012: patent LAD stent with mild disease.  . Depression   . GERD (gastroesophageal reflux disease)   . HLD (hyperlipidemia)   . Hypertensive heart disease   . Hypothyroidism   . Mild Carotid Arterial Disease    a. 04/2015 Carotid U/S: 1-39% bilat ICA stenosis.  . Mild mitral regurgitation    a. 08/2015 Echo: Ef 55-60%, no rwma, mild MR.    Past Surgical History:  Procedure Laterality Date  . CARDIAC  CATHETERIZATION  12/2009  . CARDIAC CATHETERIZATION N/A 06/03/2016   Procedure: Left Heart Cath and Coronary Angiography;  Surgeon: Lorretta Harp, MD;  Location: [place];  Service: Cardiovascular;  Laterality: N/A;  . CESAREAN SECTION     several   . CORONARY ANGIOPLASTY WITH STENT PLACEMENT  12/2009   DES to proximal LAD  . CORONARY STENT PLACEMENT    . LEFT HEART CATHETERIZATION WITH CORONARY ANGIOGRAM N/A 03/20/2012   Procedure: LEFT HEART CATHETERIZATION WITH CORONARY ANGIOGRAM;  Surgeon: Hillary Bow, MD;  Location: [facility] CATH LAB;  Service: Cardiovascular;  Laterality: N/A;     Current Outpatient Medications  Medication Sig Dispense Refill  . acetaminophen (TYLENOL) 500 MG tablet Take 500-1,000 mg by mouth every 8 (eight) hours as needed for mild pain or headache.    Marland Kitchen amLODipine (NORVASC) 5 MG tablet Take 5 mg by mouth daily.    Marland Kitchen anti-nausea (EMETROL) solution Take 10 mLs by mouth every 15 (fifteen) minutes as needed for nausea or vomiting.    Marland Kitchen aspirin 81 MG EC tablet Take 1 tablet (81 mg total) by mouth daily.    . fluticasone (FLONASE) 50 MCG/ACT nasal spray Place 2 sprays into both nostrils every morning.     Marland Kitchen levothyroxine (SYNTHROID, LEVOTHROID) 50 MCG tablet Take 50 mcg by mouth daily.     Marland Kitchen loratadine (CLARITIN) 10 MG tablet Take 10 mg by mouth daily.    Marland Kitchen  metoprolol tartrate (LOPRESSOR) 50 MG tablet Take 1 tablet (50 mg total) by mouth 2 (two) times daily. 60 tablet 6  . Multiple Vitamins-Minerals (EYE VITAMINS) CAPS Take 1 capsule by mouth daily.    . nitroGLYCERIN (NITROSTAT) 0.4 MG SL tablet Place 1 tablet (0.4 mg total) under the tongue every 5 (five) minutes as needed. For chest pain (Patient taking differently: Place 0.4 mg under the tongue every 5 (five) minutes as needed for chest pain. ) 25 tablet 3  . omeprazole (PRILOSEC) 40 MG capsule Take 40 mg by mouth 2 (two) times daily.     . ondansetron (ZOFRAN) 4 MG tablet Take 4 mg by mouth every 8 (eight)  hours as needed for nausea or vomiting.     . polyethylene glycol (MIRALAX / GLYCOLAX) 17 g packet Take 17 g by mouth daily as needed for mild constipation.     . potassium chloride (K-DUR) 10 MEQ tablet Take 10 mEq by mouth daily.     . pregabalin (LYRICA) 75 MG capsule Take 75 mg by mouth at bedtime.     . promethazine (PHENERGAN) 25 MG tablet Take 1 tablet (25 mg total) by mouth every 6 (six) hours as needed for nausea or vomiting. 20 tablet 0  . simvastatin (ZOCOR) 20 MG tablet Take 1 tablet (20 mg total) by mouth every evening. (Patient taking differently: Take 20 mg by mouth at bedtime. ) 90 tablet 1  . traMADol (ULTRAM) 50 MG tablet Take 50 mg by mouth 2 (two) times a day.     . vitamin B-12 (CYANOCOBALAMIN) 1000 MCG tablet Take 1,000 mcg by mouth daily.    Marland Kitchen VITAMIN D PO Take 1 tablet by mouth daily with breakfast.    . famotidine (PEPCID) 20 MG tablet Take 1 tablet (20 mg total) by mouth 2 (two) times daily. (Patient not taking: Reported on 05/25/2019) 30 tablet 0   No current facility-administered medications for this visit.     Allergies:   Codeine, Gabapentin, Sulfa antibiotics, Sulfonamide derivatives, Wellbutrin [bupropion], and Zolpidem    Social History:  The patient  reports that she has never smoked. She has never used smokeless tobacco. She reports that she does not drink alcohol or use drugs.   Family History:  The patient's family history includes Heart failure in her mother.    ROS:  Please see the history of present illness.   Otherwise, review of systems are positive for none.   All other systems are reviewed and negative.    PHYSICAL EXAM: VS:  BP (!) 177/101   Pulse 77   Temp (!) 96.6 F (35.9 C)   Ht 5\' 4"  (1.626 m)   Wt 181 lb 3.2 oz (82.2 kg)   SpO2 98%   BMI 31.10 kg/m  , BMI Body mass index is 31.1 kg/m. GEN: Well nourished, well developed, in no acute distress  HEENT: normal  Neck: no JVD, carotid bruits, or masses Cardiac: RRR; no rubs, or  gallops,no edema . 2/6 systolic ejection murmur in the aortic area. Respiratory:  clear to auscultation bilaterally, normal work of breathing GI: soft, nontender, nondistended, + BS MS: no deformity or atrophy  Skin: warm and dry, no rash Neuro:  Strength and sensation are intact Psych: euthymic mood, full affect Right radial pulse is normal.   EKG:  EKG is not ordered today. I reviewed EKG from last month which showed normal sinus rhythm, LVH with repolarization abnormalities.    Recent Labs: 03/30/2019: BUN 6; Creatinine,  Ser 1.06; Hemoglobin 13.7; Platelets 246; Potassium 3.5; Sodium 137    Lipid Panel    Component Value Date/Time   CHOL 168 06/01/2016 0333   TRIG 145 06/01/2016 0333   HDL 55 06/01/2016 0333   CHOLHDL 3.1 06/01/2016 0333   VLDL 29 06/01/2016 0333   LDLCALC 84 06/01/2016 0333      Wt Readings from Last 3 Encounters:  05/25/19 181 lb 3.2 oz (82.2 kg)  02/09/19 180 lb (81.6 kg)  12/26/17 173 lb (78.5 kg)       No flowsheet data found.    ASSESSMENT AND PLAN:  1.  Coronary artery disease involving native coronary arteries with unstable angina: The patient is now reporting dyspnea with minimal exertion similar to prior angina.  Her symptoms are happening with minimal activities.  Given severity of her symptoms, I recommend proceeding with a right and left cardiac catheterization possible PCI.  I discussed the procedure in details as well as risks and benefits.    2.    Aortic stenosis: This was mild on echocardiogram from March 2019.  Given her current symptoms, I am going to repeat echocardiogram before cardiac catheterization.  By exam, her aortic stenosis does not appear to be severe.  3. Essential hypertension: Blood pressure is mildly elevated but she seems to be anxious: Continue to monitor for now.  4. Hyperlipidemia: Continue treatment with simvastatin.  This is being managed by her primary care physician.  I recommend a target LDL of less  than 70.    Disposition:   FU with me in 1 month  Signed,  Kathlyn Sacramento, MD  05/25/2019 11:45 AM    [place]

## 2019-05-25 NOTE — H&P (View-Only) (Signed)
Cardiology Office Note   Date:  05/25/2019   ID:  Janice Glass, DOB 12-May-1941, MRN [id number]  PCP:  Raina Mina., MD  Cardiologist:   Kathlyn Sacramento, MD   No chief complaint on file.     History of Present Illness: Janice Glass is a 78 y.o. female who presents for a followup visit regarding coronary artery disease, aortic stenosis and recent worsening of exertional dyspnea.   She has known history of coronary artery disease with previous drug-eluting stent placement to the LAD.  Cardiac catheterization in September 2017 showed patent LAD stent with moderate mid LAD stenosis beyond the stented segment.   Most recent ischemic cardiac work-up was in 2019 which included a normal Lexiscan Myoview. She was also noted to have a cardiac murmur last year.  An echocardiogram showed normal EF with mild aortic stenosis.  Over the last few months, she has experienced significant worsening of exertional dyspnea which is now happening with minimal activities.  She has not been able to do activities of daily living due to to these new symptoms.  She has no chest discomfort.  No dizziness or syncope.  No significant change in her weight or leg edema.  She feels very similar to when she had obstructive coronary artery disease.   Past Medical History:  Diagnosis Date  . Anxiety   . CAD (coronary artery disease)    a. 11/2009 Abnl myoview - inferobasal ischemia;  b. 12/2009 PCI/DES to pLAD w/ 3x15 mm Promus DES;  c. Relook Cath 01/08/2010: patent stent, nonobs dzs. Relook cath 02/2012: patent LAD stent with mild disease.  . Depression   . GERD (gastroesophageal reflux disease)   . HLD (hyperlipidemia)   . Hypertensive heart disease   . Hypothyroidism   . Mild Carotid Arterial Disease    a. 04/2015 Carotid U/S: 1-39% bilat ICA stenosis.  . Mild mitral regurgitation    a. 08/2015 Echo: Ef 55-60%, no rwma, mild MR.    Past Surgical History:  Procedure Laterality Date  . CARDIAC  CATHETERIZATION  12/2009  . CARDIAC CATHETERIZATION N/A 06/03/2016   Procedure: Left Heart Cath and Coronary Angiography;  Surgeon: Lorretta Harp, MD;  Location: [place] [facility];  Service: Cardiovascular;  Laterality: N/A;  . CESAREAN SECTION     several   . CORONARY ANGIOPLASTY WITH STENT PLACEMENT  12/2009   DES to proximal LAD  . CORONARY STENT PLACEMENT    . LEFT HEART CATHETERIZATION WITH CORONARY ANGIOGRAM N/A 03/20/2012   Procedure: LEFT HEART CATHETERIZATION WITH CORONARY ANGIOGRAM;  Surgeon: Hillary Bow, MD;  Location: [facility] CATH LAB;  Service: Cardiovascular;  Laterality: N/A;     Current Outpatient Medications  Medication Sig Dispense Refill  . acetaminophen (TYLENOL) 500 MG tablet Take 500-1,000 mg by mouth every 8 (eight) hours as needed for mild pain or headache.    Marland Kitchen amLODipine (NORVASC) 5 MG tablet Take 5 mg by mouth daily.    Marland Kitchen anti-nausea (EMETROL) solution Take 10 mLs by mouth every 15 (fifteen) minutes as needed for nausea or vomiting.    Marland Kitchen aspirin 81 MG EC tablet Take 1 tablet (81 mg total) by mouth daily.    . fluticasone (FLONASE) 50 MCG/ACT nasal spray Place 2 sprays into both nostrils every morning.     Marland Kitchen levothyroxine (SYNTHROID, LEVOTHROID) 50 MCG tablet Take 50 mcg by mouth daily.     Marland Kitchen loratadine (CLARITIN) 10 MG tablet Take 10 mg by mouth daily.    Marland Kitchen  metoprolol tartrate (LOPRESSOR) 50 MG tablet Take 1 tablet (50 mg total) by mouth 2 (two) times daily. 60 tablet 6  . Multiple Vitamins-Minerals (EYE VITAMINS) CAPS Take 1 capsule by mouth daily.    . nitroGLYCERIN (NITROSTAT) 0.4 MG SL tablet Place 1 tablet (0.4 mg total) under the tongue every 5 (five) minutes as needed. For chest pain (Patient taking differently: Place 0.4 mg under the tongue every 5 (five) minutes as needed for chest pain. ) 25 tablet 3  . omeprazole (PRILOSEC) 40 MG capsule Take 40 mg by mouth 2 (two) times daily.     . ondansetron (ZOFRAN) 4 MG tablet Take 4 mg by mouth every 8 (eight)  hours as needed for nausea or vomiting.     . polyethylene glycol (MIRALAX / GLYCOLAX) 17 g packet Take 17 g by mouth daily as needed for mild constipation.     . potassium chloride (K-DUR) 10 MEQ tablet Take 10 mEq by mouth daily.     . pregabalin (LYRICA) 75 MG capsule Take 75 mg by mouth at bedtime.     . promethazine (PHENERGAN) 25 MG tablet Take 1 tablet (25 mg total) by mouth every 6 (six) hours as needed for nausea or vomiting. 20 tablet 0  . simvastatin (ZOCOR) 20 MG tablet Take 1 tablet (20 mg total) by mouth every evening. (Patient taking differently: Take 20 mg by mouth at bedtime. ) 90 tablet 1  . traMADol (ULTRAM) 50 MG tablet Take 50 mg by mouth 2 (two) times a day.     . vitamin B-12 (CYANOCOBALAMIN) 1000 MCG tablet Take 1,000 mcg by mouth daily.    Marland Kitchen VITAMIN D PO Take 1 tablet by mouth daily with breakfast.    . famotidine (PEPCID) 20 MG tablet Take 1 tablet (20 mg total) by mouth 2 (two) times daily. (Patient not taking: Reported on 05/25/2019) 30 tablet 0   No current facility-administered medications for this visit.     Allergies:   Codeine, Gabapentin, Sulfa antibiotics, Sulfonamide derivatives, Wellbutrin [bupropion], and Zolpidem    Social History:  The patient  reports that she has never smoked. She has never used smokeless tobacco. She reports that she does not drink alcohol or use drugs.   Family History:  The patient's family history includes Heart failure in her mother.    ROS:  Please see the history of present illness.   Otherwise, review of systems are positive for none.   All other systems are reviewed and negative.    PHYSICAL EXAM: VS:  BP (!) 177/101   Pulse 77   Temp (!) 96.6 F (35.9 C)   Ht 5\' 4"  (1.626 m)   Wt 181 lb 3.2 oz (82.2 kg)   SpO2 98%   BMI 31.10 kg/m  , BMI Body mass index is 31.1 kg/m. GEN: Well nourished, well developed, in no acute distress  HEENT: normal  Neck: no JVD, carotid bruits, or masses Cardiac: RRR; no rubs, or  gallops,no edema . 2/6 systolic ejection murmur in the aortic area. Respiratory:  clear to auscultation bilaterally, normal work of breathing GI: soft, nontender, nondistended, + BS MS: no deformity or atrophy  Skin: warm and dry, no rash Neuro:  Strength and sensation are intact Psych: euthymic mood, full affect Right radial pulse is normal.   EKG:  EKG is not ordered today. I reviewed EKG from last month which showed normal sinus rhythm, LVH with repolarization abnormalities.    Recent Labs: 03/30/2019: BUN 6; Creatinine,  Ser 1.06; Hemoglobin 13.7; Platelets 246; Potassium 3.5; Sodium 137    Lipid Panel    Component Value Date/Time   CHOL 168 06/01/2016 0333   TRIG 145 06/01/2016 0333   HDL 55 06/01/2016 0333   CHOLHDL 3.1 06/01/2016 0333   VLDL 29 06/01/2016 0333   LDLCALC 84 06/01/2016 0333      Wt Readings from Last 3 Encounters:  05/25/19 181 lb 3.2 oz (82.2 kg)  02/09/19 180 lb (81.6 kg)  12/26/17 173 lb (78.5 kg)       No flowsheet data found.    ASSESSMENT AND PLAN:  1.  Coronary artery disease involving native coronary arteries with unstable angina: The patient is now reporting dyspnea with minimal exertion similar to prior angina.  Her symptoms are happening with minimal activities.  Given severity of her symptoms, I recommend proceeding with a right and left cardiac catheterization possible PCI.  I discussed the procedure in details as well as risks and benefits.    2.    Aortic stenosis: This was mild on echocardiogram from March 2019.  Given her current symptoms, I am going to repeat echocardiogram before cardiac catheterization.  By exam, her aortic stenosis does not appear to be severe.  3. Essential hypertension: Blood pressure is mildly elevated but she seems to be anxious: Continue to monitor for now.  4. Hyperlipidemia: Continue treatment with simvastatin.  This is being managed by her primary care physician.  I recommend a target LDL of less  than 70.    Disposition:   FU with me in 1 month  Signed,  Kathlyn Sacramento, MD  05/25/2019 11:45 AM    [place]

## 2019-05-25 NOTE — Patient Instructions (Addendum)
Medication Instructions:  Your physician recommends that you continue on your current medications as directed. Please refer to the Current Medication list given to you today.  If you need a refill on your cardiac medications before your next appointment, please call your pharmacy.   Testing/Procedures: Your physician has requested that you have a cardiac catheterization. Cardiac catheterization is used to diagnose and/or treat various heart conditions. Doctors may recommend this procedure for a number of different reasons. The most common reason is to evaluate chest pain. Chest pain can be a symptom of coronary artery disease (CAD), and cardiac catheterization can show whether plaque is narrowing or blocking your heart's arteries. This procedure is also used to evaluate the valves, as well as measure the blood flow and oxygen levels in different parts of your heart. For further information please visit [email or web address]. Please follow instruction sheet, as given.   Your physician has requested that you have an echocardiogram (prior to the cath on 06/02/2019). Echocardiography is a painless test that uses sound waves to create images of your heart. It provides your doctor with information about the size and shape of your heart and how well your heart's chambers and valves are working. You may receive an ultrasound enhancing agent through an IV if needed to better visualize your heart during the echo.This procedure takes approximately one hour. There are no restrictions for this procedure. This will take place at the [address], [address].    Follow-Up: Follow up in one month with Dr. Fletcher Anon  Any Other Special Instructions Will Be Listed Below (If Applicable).    [place] [facility] [address] [place] [address] Dept: [phone] Loc: [place]  05/25/2019  You are scheduled for a  Cardiac Catheterization on Wednesday, September 9 with Dr. Kathlyn Sacramento.  1. Please arrive at the [facility] (Main Entrance A) at [facility]: [address] [place], [place] [address] at 10:30 am (This time is two hours before your procedure to ensure your preparation). Free valet parking service is available.   Special note: Every effort is made to have your procedure done on time. Please understand that emergencies sometimes delay scheduled procedures.  2. Diet: Do not eat solid foods after midnight.  The patient may have clear liquids until 5am upon the day of the procedure.  3. Labs: Your provider would like for you to have the following labs today: CBC and BMET  You will need to have the coronavirus test completed prior to your procedure. An appointment has been made at 12:10 pm on 05/29/2019. This is a Drive Up Visit at the [employer] [address]. Someone will direct you to the appropriate testing line. Stay in your car and someone will be with you shortly. Please make sure to have all other labs completed before this test because you will need to stay quarantined until your procedure.  4. Medication instructions in preparation for your procedure: Nothing to hold   On the morning of your procedure, take your Aspirin and any morning medicines NOT listed above.  You may use sips of water.  5. Plan for one night stay--bring personal belongings. 6. Bring a current list of your medications and current insurance cards. 7. You MUST have a responsible person to drive you home. 8. Someone MUST be with you the first 24 hours after you arrive home or your discharge will be delayed.  9. Please wear clothes that are easy to get on and off and wear slip-on shoes.  Thank you for allowing [place] to care for you!   -- [facility] [facility]

## 2019-05-25 NOTE — Telephone Encounter (Signed)
LVM for patient to call and schedule followup with Dr. Fletcher Anon.  Opening today at 11:20.

## 2019-05-26 ENCOUNTER — Ambulatory Visit (HOSPITAL_COMMUNITY): Payer: Medicare HMO | Attending: Cardiovascular Disease

## 2019-05-26 ENCOUNTER — Telehealth: Payer: Self-pay | Admitting: Cardiovascular Disease

## 2019-05-26 DIAGNOSIS — I359 Nonrheumatic aortic valve disorder, unspecified: Secondary | ICD-10-CM | POA: Diagnosis not present

## 2019-05-26 DIAGNOSIS — Z01818 Encounter for other preprocedural examination: Secondary | ICD-10-CM | POA: Insufficient documentation

## 2019-05-26 LAB — CBC
Hematocrit: 40.2 % (ref 34.0–46.6)
Hemoglobin: 13 g/dL (ref 11.1–15.9)
MCH: 32.7 pg (ref 26.6–33.0)
MCHC: 32.3 g/dL (ref 31.5–35.7)
MCV: 101 fL — ABNORMAL HIGH (ref 79–97)
Platelets: 227 10*3/uL (ref 150–450)
RBC: 3.97 x10E6/uL (ref 3.77–5.28)
RDW: 12.2 % (ref 11.7–15.4)
WBC: 8.8 10*3/uL (ref 3.4–10.8)

## 2019-05-26 LAB — BASIC METABOLIC PANEL
BUN/Creatinine Ratio: 9 — ABNORMAL LOW (ref 12–28)
BUN: 9 mg/dL (ref 8–27)
CO2: 23 mmol/L (ref 20–29)
Calcium: 9.3 mg/dL (ref 8.7–10.3)
Chloride: 102 mmol/L (ref 96–106)
Creatinine, Ser: 1.01 mg/dL — ABNORMAL HIGH (ref 0.57–1.00)
GFR calc Af Amer: 63 mL/min/{1.73_m2} (ref >59)
GFR calc non Af Amer: 55 mL/min/{1.73_m2} — ABNORMAL LOW (ref >59)
Glucose: 84 mg/dL (ref 65–99)
Potassium: 3.9 mmol/L (ref 3.5–5.2)
Sodium: 140 mmol/L (ref 134–144)

## 2019-05-26 NOTE — Telephone Encounter (Signed)
New message:    Patient calling because she has some questions about a up coming appt that will be done at the hospital.

## 2019-05-26 NOTE — Telephone Encounter (Signed)
Pt called and wanted to be sure she will get a stent if she needs it right then.Marland KitchenMarland KitchenI advised her that the MD will determine during the procedure and yes will do all he can safely do while she is there. Pt to call back f she has any further questions. Today I have advised her to ask for me so she feels comfortable calling with anything she thinks  of.

## 2019-05-29 ENCOUNTER — Other Ambulatory Visit (HOSPITAL_COMMUNITY)
Admission: RE | Admit: 2019-05-29 | Discharge: 2019-05-29 | Disposition: A | Discharge status: Home / Self Care | Payer: Medicare HMO | Source: Ambulatory Visit | Attending: Cardiovascular Disease | Admitting: Cardiovascular Disease

## 2019-05-29 DIAGNOSIS — Z01812 Encounter for preprocedural laboratory examination: Secondary | ICD-10-CM | POA: Diagnosis present

## 2019-05-29 DIAGNOSIS — Z20828 Contact with and (suspected) exposure to other viral communicable diseases: Secondary | ICD-10-CM | POA: Diagnosis not present

## 2019-05-30 LAB — NOVEL CORONAVIRUS, NAA (HOSP ORDER, SEND-OUT TO REF LAB; TAT 18-24 HRS): SARS-CoV-2, NAA: NOT DETECTED

## 2019-06-01 ENCOUNTER — Telehealth: Payer: Self-pay

## 2019-06-01 NOTE — Telephone Encounter (Signed)
Pre-procedure instructions reviewed with the patient. Patient verbalized understanding and has no questions at this time.  Pt contacted pre-catheterization scheduled at [facility] for: 12:30 pm Verified arrival time and place: [facility]) at:10:30 am   No solid food after midnight prior to cath, clear liquids until 5 AM day of procedure. Contrast allergy: No Verified no diabetes medications. No diabetic medications  AM meds can be  taken pre-cath with sip of water including: ASA 81 mg   Confirmed patient has responsible person to drive home post procedure and observe 24 hours after arriving home: Patient has a responsible person to drive them home after the procedure and to observe them for 24 hours.  Currently, due to Covid-19 pandemic, only one support person will be allowed with patient. Must be the same support person for that patient's entire stay, will be screened and required to wear a mask. They will be asked to wait in the waiting room for the duration of the patient's stay.  Patients are required to wear a mask when they enter the hospital.      COVID-19 Pre-Screening Questions:  . In the past 7 to 10 days have you had a cough,  shortness of breath, headache, congestion, fever (100 or greater) body aches, chills, sore throat, or sudden loss of taste or sense of smell? No . Have you been around anyone with known Covid 19. No . Have you been around anyone who is awaiting Covid 19 test results in the past 7 to 10 days? No . Have you been around anyone who has been exposed to Covid 19, or has mentioned symptoms of Covid 19 within the past 7 to 10 days? No  If you have any concerns/questions about symptoms patients report during screening (either on the phone or at threshold). Contact the provider seeing the patient or DOD for further guidance.  If neither are available contact a member of the leadership team.

## 2019-06-02 ENCOUNTER — Other Ambulatory Visit: Payer: Self-pay

## 2019-06-02 ENCOUNTER — Ambulatory Visit (HOSPITAL_COMMUNITY)
Admission: RE | Admit: 2019-06-02 | Discharge: 2019-06-02 | Disposition: A | Discharge status: Home / Self Care | Payer: Medicare HMO | Attending: Cardiovascular Disease | Admitting: Cardiovascular Disease

## 2019-06-02 ENCOUNTER — Encounter (HOSPITAL_COMMUNITY)
Admission: RE | Disposition: A | Discharge status: Home / Self Care | Payer: Self-pay | Source: Home / Self Care | Attending: Cardiovascular Disease

## 2019-06-02 DIAGNOSIS — I119 Hypertensive heart disease without heart failure: Secondary | ICD-10-CM | POA: Diagnosis not present

## 2019-06-02 DIAGNOSIS — Z7982 Long term (current) use of aspirin: Secondary | ICD-10-CM | POA: Diagnosis not present

## 2019-06-02 DIAGNOSIS — Z79899 Other long term (current) drug therapy: Secondary | ICD-10-CM | POA: Insufficient documentation

## 2019-06-02 DIAGNOSIS — Z888 Allergy status to other drugs, medicaments and biological substances status: Secondary | ICD-10-CM | POA: Diagnosis not present

## 2019-06-02 DIAGNOSIS — I25118 Atherosclerotic heart disease of native coronary artery with other forms of angina pectoris: Secondary | ICD-10-CM | POA: Diagnosis present

## 2019-06-02 DIAGNOSIS — I350 Nonrheumatic aortic (valve) stenosis: Secondary | ICD-10-CM | POA: Diagnosis not present

## 2019-06-02 DIAGNOSIS — E785 Hyperlipidemia, unspecified: Secondary | ICD-10-CM | POA: Diagnosis not present

## 2019-06-02 DIAGNOSIS — F329 Major depressive disorder, single episode, unspecified: Secondary | ICD-10-CM | POA: Insufficient documentation

## 2019-06-02 DIAGNOSIS — Z79890 Hormone replacement therapy (postmenopausal): Secondary | ICD-10-CM | POA: Diagnosis not present

## 2019-06-02 DIAGNOSIS — K219 Gastro-esophageal reflux disease without esophagitis: Secondary | ICD-10-CM | POA: Insufficient documentation

## 2019-06-02 DIAGNOSIS — R0609 Other forms of dyspnea: Secondary | ICD-10-CM | POA: Insufficient documentation

## 2019-06-02 DIAGNOSIS — F419 Anxiety disorder, unspecified: Secondary | ICD-10-CM | POA: Diagnosis not present

## 2019-06-02 DIAGNOSIS — I25110 Atherosclerotic heart disease of native coronary artery with unstable angina pectoris: Secondary | ICD-10-CM

## 2019-06-02 DIAGNOSIS — Z885 Allergy status to narcotic agent status: Secondary | ICD-10-CM | POA: Insufficient documentation

## 2019-06-02 DIAGNOSIS — Z882 Allergy status to sulfonamides status: Secondary | ICD-10-CM | POA: Diagnosis not present

## 2019-06-02 DIAGNOSIS — E039 Hypothyroidism, unspecified: Secondary | ICD-10-CM | POA: Insufficient documentation

## 2019-06-02 HISTORY — PX: RIGHT/LEFT HEART CATH AND CORONARY ANGIOGRAPHY: CATH118266

## 2019-06-02 LAB — POCT I-STAT EG7
Acid-Base Excess: 1 mmol/L (ref 0.0–2.0)
Acid-Base Excess: 2 mmol/L (ref 0.0–2.0)
Bicarbonate: 26.5 mmol/L (ref 20.0–28.0)
Bicarbonate: 27.1 mmol/L (ref 20.0–28.0)
Calcium, Ion: 1.18 mmol/L (ref 1.15–1.40)
Calcium, Ion: 1.21 mmol/L (ref 1.15–1.40)
HCT: 36 % (ref 36.0–46.0)
HCT: 36 % (ref 36.0–46.0)
Hemoglobin: 12.2 g/dL (ref 12.0–15.0)
Hemoglobin: 12.2 g/dL (ref 12.0–15.0)
O2 Saturation: 82 %
O2 Saturation: 83 %
Potassium: 3.5 mmol/L (ref 3.5–5.1)
Potassium: 3.5 mmol/L (ref 3.5–5.1)
Sodium: 139 mmol/L (ref 135–145)
Sodium: 139 mmol/L (ref 135–145)
TCO2: 28 mmol/L (ref 22–32)
TCO2: 28 mmol/L (ref 22–32)
pCO2, Ven: 45.8 mmHg (ref 44.0–60.0)
pCO2, Ven: 46 mmHg (ref 44.0–60.0)
pH, Ven: 7.369 (ref 7.250–7.430)
pH, Ven: 7.38 (ref 7.250–7.430)
pO2, Ven: 48 mmHg — ABNORMAL HIGH (ref 32.0–45.0)
pO2, Ven: 49 mmHg — ABNORMAL HIGH (ref 32.0–45.0)

## 2019-06-02 LAB — POCT I-STAT 7, (LYTES, BLD GAS, ICA,H+H)
Bicarbonate: 25.8 mmol/L (ref 20.0–28.0)
Calcium, Ion: 1.22 mmol/L (ref 1.15–1.40)
HCT: 36 % (ref 36.0–46.0)
Hemoglobin: 12.2 g/dL (ref 12.0–15.0)
O2 Saturation: 99 %
Potassium: 3.6 mmol/L (ref 3.5–5.1)
Sodium: 139 mmol/L (ref 135–145)
TCO2: 27 mmol/L (ref 22–32)
pCO2 arterial: 43.4 mmHg (ref 32.0–48.0)
pH, Arterial: 7.383 (ref 7.350–7.450)
pO2, Arterial: 148 mmHg — ABNORMAL HIGH (ref 83.0–108.0)

## 2019-06-02 SURGERY — RIGHT/LEFT HEART CATH AND CORONARY ANGIOGRAPHY
Anesthesia: LOCAL

## 2019-06-02 MED ORDER — SODIUM CHLORIDE 0.9 % IV SOLN: INTRAVENOUS | Status: DC

## 2019-06-02 MED ORDER — SODIUM CHLORIDE 0.9 % IV SOLN: 250.0000 mL | INTRAVENOUS | Status: DC | PRN

## 2019-06-02 MED ORDER — ACETAMINOPHEN 325 MG PO TABS: 650.0000 mg | ORAL_TABLET | ORAL | Status: DC | PRN

## 2019-06-02 MED ORDER — LABETALOL HCL 5 MG/ML IV SOLN: 10.0000 mg | INTRAVENOUS | Status: DC | PRN

## 2019-06-02 MED ORDER — VERAPAMIL HCL 2.5 MG/ML IV SOLN
INTRAVENOUS | Status: AC
  Filled 2019-06-02: qty 2

## 2019-06-02 MED ORDER — HEPARIN SODIUM (PORCINE) 1000 UNIT/ML IJ SOLN
INTRAMUSCULAR | Status: DC | PRN
  Administered 2019-06-02: 4000 [IU] via INTRAVENOUS

## 2019-06-02 MED ORDER — MIDAZOLAM HCL 2 MG/2ML IJ SOLN
INTRAMUSCULAR | Status: DC | PRN
  Administered 2019-06-02: 1 mg via INTRAVENOUS

## 2019-06-02 MED ORDER — HEPARIN SODIUM (PORCINE) 1000 UNIT/ML IJ SOLN
INTRAMUSCULAR | Status: AC
  Filled 2019-06-02: qty 1

## 2019-06-02 MED ORDER — SODIUM CHLORIDE 0.9% FLUSH: 3.0000 mL | INTRAVENOUS | Status: DC | PRN

## 2019-06-02 MED ORDER — ASPIRIN 81 MG PO CHEW: 81.0000 mg | CHEWABLE_TABLET | ORAL | Status: DC

## 2019-06-02 MED ORDER — ONDANSETRON HCL 4 MG/2ML IJ SOLN: 4.0000 mg | Freq: Four times a day (QID) | INTRAMUSCULAR | Status: DC | PRN

## 2019-06-02 MED ORDER — SODIUM CHLORIDE 0.9% FLUSH: 3.0000 mL | Freq: Two times a day (BID) | INTRAVENOUS | Status: DC

## 2019-06-02 MED ORDER — FENTANYL CITRATE (PF) 100 MCG/2ML IJ SOLN
INTRAMUSCULAR | Status: AC
  Filled 2019-06-02: qty 2

## 2019-06-02 MED ORDER — MIDAZOLAM HCL 2 MG/2ML IJ SOLN
INTRAMUSCULAR | Status: AC
  Filled 2019-06-02: qty 2

## 2019-06-02 MED ORDER — VERAPAMIL HCL 2.5 MG/ML IV SOLN
INTRAVENOUS | Status: DC | PRN
  Administered 2019-06-02: 14:00:00 10 mL via INTRA_ARTERIAL

## 2019-06-02 MED ORDER — LIDOCAINE HCL (PF) 1 % IJ SOLN
INTRAMUSCULAR | Status: DC | PRN
  Administered 2019-06-02: 4 mL

## 2019-06-02 MED ORDER — HEPARIN (PORCINE) IN NACL 1000-0.9 UT/500ML-% IV SOLN
INTRAVENOUS | Status: AC
  Filled 2019-06-02: qty 1000

## 2019-06-02 MED ORDER — FENTANYL CITRATE (PF) 100 MCG/2ML IJ SOLN
INTRAMUSCULAR | Status: DC | PRN
  Administered 2019-06-02 (×2): 25 ug via INTRAVENOUS

## 2019-06-02 MED ORDER — HEPARIN (PORCINE) IN NACL 1000-0.9 UT/500ML-% IV SOLN
INTRAVENOUS | Status: DC | PRN
  Administered 2019-06-02 (×2): 500 mL

## 2019-06-02 MED ORDER — IOHEXOL 350 MG/ML SOLN
INTRAVENOUS | Status: DC | PRN
  Administered 2019-06-02: 15:00:00 90 mL via INTRA_ARTERIAL

## 2019-06-02 MED ORDER — SODIUM CHLORIDE 0.9 % IV SOLN
INTRAVENOUS | Status: DC
  Administered 2019-06-02: 12:00:00 via INTRAVENOUS

## 2019-06-02 MED ORDER — LIDOCAINE HCL (PF) 1 % IJ SOLN
INTRAMUSCULAR | Status: AC
  Filled 2019-06-02: qty 30

## 2019-06-02 SURGICAL SUPPLY — 16 items
CATH BALLN WEDGE 5F 110CM (CATHETERS) ×1 IMPLANT
CATH INFINITI 5 FR 3DRC (CATHETERS) ×1 IMPLANT
CATH INFINITI 5FR JK (CATHETERS) ×1 IMPLANT
CATH INFINITI JR4 5F (CATHETERS) ×1 IMPLANT
DEVICE RAD COMP TR BAND LRG (VASCULAR PRODUCTS) ×1 IMPLANT
GLIDESHEATH SLEND SS 6F .021 (SHEATH) ×1 IMPLANT
GUIDEWIRE .025 260CM (WIRE) ×1 IMPLANT
GUIDEWIRE INQWIRE 1.5J.035X260 (WIRE) IMPLANT
INQWIRE 1.5J .035X260CM (WIRE) ×2
KIT HEART LEFT (KITS) ×2 IMPLANT
PACK CARDIAC CATHETERIZATION (CUSTOM PROCEDURE TRAY) ×2 IMPLANT
SHEATH GLIDE SLENDER 4/5FR (SHEATH) ×1 IMPLANT
SHEATH PROBE COVER 6X72 (BAG) ×1 IMPLANT
TRANSDUCER W/STOPCOCK (MISCELLANEOUS) ×2 IMPLANT
TUBING CIL FLEX 10 FLL-RA (TUBING) ×2 IMPLANT
WIRE HI TORQ VERSACORE-J 145CM (WIRE) IMPLANT

## 2019-06-02 NOTE — Research (Signed)
[employer] Informed Consent   Subject Name: Janice Glass  Subject met inclusion and exclusion criteria.  The informed consent form, study requirements and expectations were reviewed with the subject and questions and concerns were addressed prior to the signing of the consent form.  The subject verbalized understanding of the trail requirements.  The subject agreed to participate in the [facility] trial and signed the informed consent.  The informed consent was obtained prior to performance of any protocol-specific procedures for the subject.  A copy of the signed informed consent was given to the subject and a copy was placed in the subject's medical record.  Philemon Kingdom D 06/02/2019, 1152

## 2019-06-02 NOTE — Progress Notes (Signed)
[facility] BAND REMOVAL  LOCATION:    right radial  DEFLATED PER PROTOCOL:    Yes.    TIME BAND OFF / DRESSING APPLIED:    1650    SITE UPON ARRIVAL:    Level 0  SITE AFTER BAND REMOVAL:    Level 0  CIRCULATION SENSATION AND MOVEMENT:    Within Normal Limits   Yes.    COMMENTS:   tegaderm dsg applied

## 2019-06-02 NOTE — Interval H&P Note (Signed)
Cath Lab Visit (complete for each Cath Lab visit)  Clinical Evaluation Leading to the Procedure:   ACS: No.  Non-ACS:    Anginal Classification: CCS III  Anti-ischemic medical therapy: Maximal Therapy (2 or more classes of medications)  Non-Invasive Test Results: No non-invasive testing performed  Prior CABG: No previous CABG      History and Physical Interval Note:  06/02/2019 2:14 PM  Janice Glass  has presented today for surgery, with the diagnosis of unstable angina.  The various methods of treatment have been discussed with the patient and family. After consideration of risks, benefits and other options for treatment, the patient has consented to  Procedure(s): RIGHT/LEFT HEART CATH AND CORONARY ANGIOGRAPHY (N/A) as a surgical intervention.  The patient's history has been reviewed, patient examined, no change in status, stable for surgery.  I have reviewed the patient's chart and labs.  Questions were answered to the patient's satisfaction.     Kathlyn Sacramento

## 2019-06-02 NOTE — Progress Notes (Signed)
Discharge instructions reviewed with pt and pt daughter. Both voice understanding.

## 2019-06-02 NOTE — Discharge Instructions (Signed)
Radial Site Care ° °This sheet gives you information about how to care for yourself after your procedure. Your health care provider may also give you more specific instructions. If you have problems or questions, contact your health care provider. °What can I expect after the procedure? °After the procedure, it is common to have: °· Bruising and tenderness at the catheter insertion area. °Follow these instructions at home: °Medicines °· Take over-the-counter and prescription medicines only as told by your health care provider. °Insertion site care °· Follow instructions from your health care provider about how to take care of your insertion site. Make sure you: °? Wash your hands with soap and water before you change your bandage (dressing). If soap and water are not available, use hand sanitizer. °? Change your dressing as told by your health care provider. °? Leave stitches (sutures), skin glue, or adhesive strips in place. These skin closures may need to stay in place for 2 weeks or longer. If adhesive strip edges start to loosen and curl up, you may trim the loose edges. Do not remove adhesive strips completely unless your health care provider tells you to do that. °· Check your insertion site every day for signs of infection. Check for: °? Redness, swelling, or pain. °? Fluid or blood. °? Pus or a bad smell. °? Warmth. °· Do not take baths, swim, or use a hot tub until your health care provider approves. °· You may shower 24-48 hours after the procedure, or as directed by your health care provider. °? Remove the dressing and gently wash the site with plain soap and water. °? Pat the area dry with a clean towel. °? Do not rub the site. That could cause bleeding. °· Do not apply powder or lotion to the site. °Activity ° °· For 24 hours after the procedure, or as directed by your health care provider: °? Do not flex or bend the affected arm. °? Do not push or pull heavy objects with the affected arm. °? Do not  drive yourself home from the hospital or clinic. You may drive 24 hours after the procedure unless your health care provider tells you not to. °? Do not operate machinery or power tools. °· Do not lift anything that is heavier than 10 lb (4.5 kg), or the limit that you are told, until your health care provider says that it is safe. °· Ask your health care provider when it is okay to: °? Return to work or school. °? Resume usual physical activities or sports. °? Resume sexual activity. °General instructions °· If the catheter site starts to bleed, raise your arm and put firm pressure on the site. If the bleeding does not stop, get help right away. This is a medical emergency. °· If you went home on the same day as your procedure, a responsible adult should be with you for the first 24 hours after you arrive home. °· Keep all follow-up visits as told by your health care provider. This is important. °Contact a health care provider if: °· You have a fever. °· You have redness, swelling, or yellow drainage around your insertion site. °Get help right away if: °· You have unusual pain at the radial site. °· The catheter insertion area swells very fast. °· The insertion area is bleeding, and the bleeding does not stop when you hold steady pressure on the area. °· Your arm or hand becomes pale, cool, tingly, or numb. °These symptoms may represent a serious problem   that is an emergency. Do not wait to see if the symptoms will go away. Get medical help right away. Call your local emergency services (911 in the [place] Do not drive yourself to the hospital. °Summary °· After the procedure, it is common to have bruising and tenderness at the site. °· Follow instructions from your health care provider about how to take care of your radial site wound. Check the wound every day for signs of infection. °· Do not lift anything that is heavier than 10 lb (4.5 kg), or the limit that you are told, until your health care provider says  that it is safe. °This information is not intended to replace advice given to you by your health care provider. Make sure you discuss any questions you have with your health care provider. °Document Released: 10/12/2010 Document Revised: 10/15/2017 Document Reviewed: 10/15/2017 °Elsevier Patient Education © 2020 [employer] ° °

## 2019-06-02 NOTE — Progress Notes (Signed)
Assumed care of patient. Report received. Assessment done.

## 2019-06-03 ENCOUNTER — Encounter (HOSPITAL_COMMUNITY): Payer: Self-pay | Admitting: Cardiovascular Disease

## 2019-07-06 ENCOUNTER — Ambulatory Visit (INDEPENDENT_AMBULATORY_CARE_PROVIDER_SITE_OTHER): Payer: Medicare HMO | Admitting: Cardiovascular Disease

## 2019-07-06 ENCOUNTER — Other Ambulatory Visit: Payer: Self-pay

## 2019-07-06 ENCOUNTER — Encounter: Payer: Self-pay | Admitting: Cardiovascular Disease

## 2019-07-06 VITALS — BP 181/96 | HR 95 | Temp 97.1°F | Ht 63.0 in | Wt 182.0 lb

## 2019-07-06 DIAGNOSIS — I359 Nonrheumatic aortic valve disorder, unspecified: Secondary | ICD-10-CM

## 2019-07-06 DIAGNOSIS — I2510 Atherosclerotic heart disease of native coronary artery without angina pectoris: Secondary | ICD-10-CM

## 2019-07-06 DIAGNOSIS — E785 Hyperlipidemia, unspecified: Secondary | ICD-10-CM

## 2019-07-06 DIAGNOSIS — I10 Essential (primary) hypertension: Secondary | ICD-10-CM | POA: Diagnosis not present

## 2019-07-06 MED ORDER — AMLODIPINE BESYLATE 10 MG PO TABS: 10.0000 mg | ORAL_TABLET | Freq: Every day | ORAL | 1 refills | Status: DC

## 2019-07-06 MED ORDER — NITROGLYCERIN 0.4 MG SL SUBL: 0.4000 mg | SUBLINGUAL_TABLET | SUBLINGUAL | 3 refills | Status: DC | PRN

## 2019-07-06 NOTE — Patient Instructions (Signed)
Medication Instructions:  INCREASE the Amlodipine to 10 mg once daily  If you need a refill on your cardiac medications before your next appointment, please call your pharmacy.   Lab work: None ordered If you have labs (blood work) drawn today and your tests are completely normal, you will receive your results only by: Marland Kitchen MyChart Message (if you have MyChart) OR . A paper copy in the mail If you have any lab test that is abnormal or we need to change your treatment, we will call you to review the results.  Testing/Procedures: None ordered  Follow-Up: At [facility], you and your health needs are our priority.  As part of our continuing mission to provide you with exceptional heart care, we have created designated Provider Care Teams.  These Care Teams include your primary Cardiologist (physician) and Advanced Practice Providers (APPs -  Physician Assistants and Nurse Practitioners) who all work together to provide you with the care you need, when you need it. You will need a follow up appointment in 6 months.  Please call our office 2 months in advance to schedule this appointment.  You may see Kathlyn Sacramento, MD or one of the following Advanced Practice Providers on your designated Care Team:   Kerin Ransom, PA-C Roby Lofts, [place] . Sande Rives, PA-C

## 2019-07-06 NOTE — Progress Notes (Signed)
Cardiology Office Note   Date:  07/06/2019   ID:  Janice Glass, DOB May 26, 1941, MRN [id number]  PCP:  Raina Mina., MD  Cardiologist:   Kathlyn Sacramento, MD   No chief complaint on file.     History of Present Illness: Janice Glass is a 78 y.o. female who presents for a followup visit regarding coronary artery disease and aortic stenosis.   She has known history of coronary artery disease with previous drug-eluting stent placement to the LAD.  Cardiac catheterization in September 2017 showed patent LAD stent with moderate mid LAD stenosis beyond the stented segment.   She was seen recently for worsening exertional dyspnea and fatigue.  I repeated her echocardiogram which showed normal LV systolic function with mild aortic stenosis. I proceeded with a right and left cardiac catheterization last month which showed widely patent proximal LAD stent with no significant restenosis.  There was stable 50% stenosis in the distal LAD and 50% stenosis in the proximal right coronary artery with significant ostial stenosis and a small second diagonal.  Right heart catheterization was normal.  There was 15 mm peak to peak gradient across the aortic valve consistent with mild aortic stenosis. No revascularization was required. She saw her primary care physician after and the dose of Lyrica was decreased which improved her symptoms of shortness of breath and fatigue.  Her blood pressure continues to be elevated.  No chest pain.   Past Medical History:  Diagnosis Date  . Anxiety   . CAD (coronary artery disease)    a. 11/2009 Abnl myoview - inferobasal ischemia;  b. 12/2009 PCI/DES to pLAD w/ 3x15 mm Promus DES;  c. Relook Cath 01/08/2010: patent stent, nonobs dzs. Relook cath 02/2012: patent LAD stent with mild disease.  . Depression   . GERD (gastroesophageal reflux disease)   . HLD (hyperlipidemia)   . Hypertensive heart disease   . Hypothyroidism   . Mild Carotid Arterial Disease    a. 04/2015 Carotid U/S: 1-39% bilat ICA stenosis.  . Mild mitral regurgitation    a. 08/2015 Echo: Ef 55-60%, no rwma, mild MR.    Past Surgical History:  Procedure Laterality Date  . CARDIAC CATHETERIZATION  12/2009  . CARDIAC CATHETERIZATION N/A 06/03/2016   Procedure: Left Heart Cath and Coronary Angiography;  Surgeon: Lorretta Harp, MD;  Location: [place] [facility];  Service: Cardiovascular;  Laterality: N/A;  . CESAREAN SECTION     several   . CORONARY ANGIOPLASTY WITH STENT PLACEMENT  12/2009   DES to proximal LAD  . CORONARY STENT PLACEMENT    . LEFT HEART CATHETERIZATION WITH CORONARY ANGIOGRAM N/A 03/20/2012   Procedure: LEFT HEART CATHETERIZATION WITH CORONARY ANGIOGRAM;  Surgeon: Hillary Bow, MD;  Location: [facility];  Service: Cardiovascular;  Laterality: N/A;  . RIGHT/LEFT HEART CATH AND CORONARY ANGIOGRAPHY N/A 06/02/2019   Procedure: RIGHT/LEFT HEART CATH AND CORONARY ANGIOGRAPHY;  Surgeon: Wellington Hampshire, MD;  Location: [place] [facility];  Service: Cardiovascular;  Laterality: N/A;     Current Outpatient Medications  Medication Sig Dispense Refill  . acetaminophen (TYLENOL) 500 MG tablet Take 500-1,000 mg by mouth every 8 (eight) hours as needed for mild pain or headache.    Marland Kitchen amLODipine (NORVASC) 5 MG tablet Take 5 mg by mouth daily.    Marland Kitchen anti-nausea (EMETROL) solution Take 10 mLs by mouth every 15 (fifteen) minutes as needed for nausea or vomiting.    Marland Kitchen aspirin 81 MG EC  tablet Take 1 tablet (81 mg total) by mouth daily.    . fluticasone (FLONASE) 50 MCG/ACT nasal spray Place 2 sprays into both nostrils 2 (two) times daily.     Marland Kitchen levothyroxine (SYNTHROID, LEVOTHROID) 50 MCG tablet Take 50 mcg by mouth daily before breakfast.     . loratadine (CLARITIN) 10 MG tablet Take 10 mg by mouth daily.    . Melatonin 10 MG TABS Take 10 mg by mouth at bedtime as needed (sleep).    . metoprolol tartrate (LOPRESSOR) 50 MG tablet Take 1 tablet (50 mg total) by mouth 2  (two) times daily. 60 tablet 6  . Multiple Vitamins-Minerals (EYE VITAMINS) CAPS Take 1 capsule by mouth daily.    . nitroGLYCERIN (NITROSTAT) 0.4 MG SL tablet Place 1 tablet (0.4 mg total) under the tongue every 5 (five) minutes as needed. For chest pain (Patient taking differently: Place 0.4 mg under the tongue every 5 (five) minutes as needed for chest pain. ) 25 tablet 3  . omeprazole (PRILOSEC) 40 MG capsule Take 40 mg by mouth 2 (two) times daily.     . ondansetron (ZOFRAN) 4 MG tablet Take 4 mg by mouth every 8 (eight) hours as needed for nausea or vomiting.     . polyethylene glycol (MIRALAX / GLYCOLAX) 17 g packet Take 17 g by mouth daily as needed for mild constipation.     . potassium chloride (K-DUR) 10 MEQ tablet Take 10 mEq by mouth every evening.     . pregabalin (LYRICA) 75 MG capsule Take 75 mg by mouth at bedtime.     . promethazine (PHENERGAN) 25 MG tablet Take 1 tablet (25 mg total) by mouth every 6 (six) hours as needed for nausea or vomiting. 20 tablet 0  . simvastatin (ZOCOR) 20 MG tablet Take 1 tablet (20 mg total) by mouth every evening. (Patient taking differently: Take 20 mg by mouth at bedtime. ) 90 tablet 1  . traMADol (ULTRAM) 50 MG tablet Take 50 mg by mouth every 12 (twelve) hours as needed (pain).     . Vitamin D, Ergocalciferol, (DRISDOL) 1.25 MG (50000 UT) CAPS capsule Take 50,000 Units by mouth every Thursday.     No current facility-administered medications for this visit.     Allergies:   Ciprofloxacin, Codeine, Gabapentin, Sulfa antibiotics, Sulfonamide derivatives, Wellbutrin [bupropion], and Zolpidem    Social History:  The patient  reports that she has never smoked. She has never used smokeless tobacco. She reports that she does not drink alcohol or use drugs.   Family History:  The patient's family history includes Heart failure in her mother.    ROS:  Please see the history of present illness.   Otherwise, review of systems are positive for none.    All other systems are reviewed and negative.    PHYSICAL EXAM: VS:  BP (!) 181/96   Pulse 95   Temp (!) 97.1 F (36.2 C)   Ht 5\' 3"  (1.6 m)   Wt 182 lb (82.6 kg)   SpO2 99%   BMI 32.24 kg/m  , BMI Body mass index is 32.24 kg/m. GEN: Well nourished, well developed, in no acute distress  HEENT: normal  Neck: no JVD, carotid bruits, or masses Cardiac: RRR; no rubs, or gallops,no edema . 2/6 systolic ejection murmur in the aortic area. Respiratory:  clear to auscultation bilaterally, normal work of breathing GI: soft, nontender, nondistended, + BS MS: no deformity or atrophy  Skin: warm and dry, no  rash Neuro:  Strength and sensation are intact Psych: euthymic mood, full affect Right radial pulse is normal.  No hematoma.   EKG:  EKG is not ordered today. I reviewed EKG from last month which showed normal sinus rhythm, LVH with repolarization abnormalities.  Isolated Q wave in lead III.    Recent Labs: 05/25/2019: BUN 9; Creatinine, Ser 1.01; Platelets 227 06/02/2019: Hemoglobin 12.2; Potassium 3.5; Sodium 139    Lipid Panel    Component Value Date/Time   CHOL 168 06/01/2016 0333   TRIG 145 06/01/2016 0333   HDL 55 06/01/2016 0333   CHOLHDL 3.1 06/01/2016 0333   VLDL 29 06/01/2016 0333   LDLCALC 84 06/01/2016 0333      Wt Readings from Last 3 Encounters:  07/06/19 182 lb (82.6 kg)  06/02/19 181 lb (82.1 kg)  05/25/19 181 lb 3.2 oz (82.2 kg)       No flowsheet data found.    ASSESSMENT AND PLAN:  1.  Coronary artery disease involving native coronary arteries without angina: Recent cardiac catheterization showed stable coronary anatomy overall with no obstructive disease other than in a small second diagonal for which I recommend continued medical therapy.    2.    Aortic stenosis: This was mild on most recent evaluation by echo and cardiac cath.  Repeat echocardiogram in 1 to 2 years.   3. Essential hypertension: Blood pressure continues to be elevated.  I  elected to increase amlodipine to 10 mg daily.  4. Hyperlipidemia: Continue treatment with simvastatin.  This is being managed by her primary care physician.  I recommend a target LDL of less than 70.    Disposition:   FU with me in 6 months  Signed,  Kathlyn Sacramento, MD  07/06/2019 11:17 AM    [place]

## 2019-08-21 DIAGNOSIS — E860 Dehydration: Secondary | ICD-10-CM

## 2019-08-21 DIAGNOSIS — K5792 Diverticulitis of intestine, part unspecified, without perforation or abscess without bleeding: Secondary | ICD-10-CM

## 2019-08-21 DIAGNOSIS — I2510 Atherosclerotic heart disease of native coronary artery without angina pectoris: Secondary | ICD-10-CM

## 2019-08-21 DIAGNOSIS — A0472 Enterocolitis due to Clostridium difficile, not specified as recurrent: Secondary | ICD-10-CM

## 2019-08-21 DIAGNOSIS — R110 Nausea: Secondary | ICD-10-CM

## 2019-08-22 DIAGNOSIS — I2510 Atherosclerotic heart disease of native coronary artery without angina pectoris: Secondary | ICD-10-CM | POA: Diagnosis not present

## 2019-08-22 DIAGNOSIS — E860 Dehydration: Secondary | ICD-10-CM | POA: Diagnosis not present

## 2019-08-22 DIAGNOSIS — A0472 Enterocolitis due to Clostridium difficile, not specified as recurrent: Secondary | ICD-10-CM | POA: Diagnosis not present

## 2019-08-22 DIAGNOSIS — K5792 Diverticulitis of intestine, part unspecified, without perforation or abscess without bleeding: Secondary | ICD-10-CM | POA: Diagnosis not present

## 2020-01-04 ENCOUNTER — Other Ambulatory Visit: Payer: Self-pay

## 2020-01-04 ENCOUNTER — Ambulatory Visit: Payer: Medicare HMO | Admitting: Cardiovascular Disease

## 2020-01-04 VITALS — BP 150/77 | HR 75 | Temp 97.3°F | Ht 64.0 in | Wt 179.8 lb

## 2020-01-04 DIAGNOSIS — E785 Hyperlipidemia, unspecified: Secondary | ICD-10-CM | POA: Diagnosis not present

## 2020-01-04 DIAGNOSIS — I359 Nonrheumatic aortic valve disorder, unspecified: Secondary | ICD-10-CM | POA: Diagnosis not present

## 2020-01-04 DIAGNOSIS — I2510 Atherosclerotic heart disease of native coronary artery without angina pectoris: Secondary | ICD-10-CM

## 2020-01-04 DIAGNOSIS — I10 Essential (primary) hypertension: Secondary | ICD-10-CM | POA: Diagnosis not present

## 2020-01-04 NOTE — Progress Notes (Signed)
Cardiology Office Note   Date:  01/04/2020   ID:  Janice Glass, DOB 01-31-1941, MRN [id number]  PCP:  Raina Mina., MD  Cardiologist:   Kathlyn Sacramento, MD   No chief complaint on file.     History of Present Illness: Janice Glass is a 79 y.o. female who presents for a followup visit regarding coronary artery disease and aortic stenosis.   She has known history of coronary artery disease with previous drug-eluting stent placement to the LAD.  Cardiac catheterization in September 2017 showed patent LAD stent with moderate mid LAD stenosis beyond the stented segment.   She was seen last year for worsening exertional dyspnea and fatigue.  Echocardiogram in September showed normal LV systolic function with mild aortic stenosis. Right and left cardiac catheterization September 2020 showed widely patent proximal LAD stent with no significant restenosis.  There was stable 50% stenosis in the distal LAD and 50% stenosis in the proximal right coronary artery with significant ostial stenosis in a small second diagonal.  Right heart catheterization was normal.  There was 15 mm peak to peak gradient across the aortic valve consistent with mild aortic stenosis. No revascularization was required.  During last visit, she was noted to have elevated blood pressure.  I increased amlodipine to 10 mg daily but this was subsequently decreased by Dr. Bea Graff back to 5 mg once daily.  She has been doing well with no recent chest pain or significant dyspnea.  She fell in November and broke her right arm but that has healed.  She has been dealing with worsening allergies and was taking Sudafed frequently.  She received a Kenalog shot yesterday and she thinks that is why her blood pressure is running high.  Past Medical History:  Diagnosis Date  . Anxiety   . CAD (coronary artery disease)    a. 11/2009 Abnl myoview - inferobasal ischemia;  b. 12/2009 PCI/DES to pLAD w/ 3x15 mm Promus DES;  c. Relook  Cath 01/08/2010: patent stent, nonobs dzs. Relook cath 02/2012: patent LAD stent with mild disease.  . Depression   . GERD (gastroesophageal reflux disease)   . HLD (hyperlipidemia)   . Hypertensive heart disease   . Hypothyroidism   . Mild Carotid Arterial Disease    a. 04/2015 Carotid U/S: 1-39% bilat ICA stenosis.  . Mild mitral regurgitation    a. 08/2015 Echo: Ef 55-60%, no rwma, mild MR.    Past Surgical History:  Procedure Laterality Date  . CARDIAC CATHETERIZATION  12/2009  . CARDIAC CATHETERIZATION N/A 06/03/2016   Procedure: Left Heart Cath and Coronary Angiography;  Surgeon: Lorretta Harp, MD;  Location: [place] [facility];  Service: Cardiovascular;  Laterality: N/A;  . CESAREAN SECTION     several   . CORONARY ANGIOPLASTY WITH STENT PLACEMENT  12/2009   DES to proximal LAD  . CORONARY STENT PLACEMENT    . LEFT HEART CATHETERIZATION WITH CORONARY ANGIOGRAM N/A 03/20/2012   Procedure: LEFT HEART CATHETERIZATION WITH CORONARY ANGIOGRAM;  Surgeon: Hillary Bow, MD;  Location: [facility] CATH LAB;  Service: Cardiovascular;  Laterality: N/A;  . RIGHT/LEFT HEART CATH AND CORONARY ANGIOGRAPHY N/A 06/02/2019   Procedure: RIGHT/LEFT HEART CATH AND CORONARY ANGIOGRAPHY;  Surgeon: Wellington Hampshire, MD;  Location: [place] [facility];  Service: Cardiovascular;  Laterality: N/A;     Current Outpatient Medications  Medication Sig Dispense Refill  . acetaminophen (TYLENOL) 500 MG tablet Take 500-1,000 mg by mouth every 8 (eight) hours  as needed for mild pain or headache.    Marland Kitchen amLODipine (NORVASC) 10 MG tablet Take 1 tablet (10 mg total) by mouth daily. 90 tablet 1  . anti-nausea (EMETROL) solution Take 10 mLs by mouth every 15 (fifteen) minutes as needed for nausea or vomiting.    Marland Kitchen aspirin 81 MG EC tablet Take 1 tablet (81 mg total) by mouth daily.    . fluticasone (FLONASE) 50 MCG/ACT nasal spray Place 2 sprays into both nostrils 2 (two) times daily.     Marland Kitchen levothyroxine (SYNTHROID,  LEVOTHROID) 50 MCG tablet Take 50 mcg by mouth daily before breakfast.     . loratadine (CLARITIN) 10 MG tablet Take 10 mg by mouth daily.    . Melatonin 10 MG TABS Take 10 mg by mouth at bedtime as needed (sleep).    . metoprolol tartrate (LOPRESSOR) 50 MG tablet Take 1 tablet (50 mg total) by mouth 2 (two) times daily. 60 tablet 6  . Multiple Vitamins-Minerals (EYE VITAMINS) CAPS Take 1 capsule by mouth daily.    . nitroGLYCERIN (NITROSTAT) 0.4 MG SL tablet Place 1 tablet (0.4 mg total) under the tongue every 5 (five) minutes as needed. For chest pain 25 tablet 3  . omeprazole (PRILOSEC) 40 MG capsule Take 40 mg by mouth 2 (two) times daily.     . ondansetron (ZOFRAN) 4 MG tablet Take 4 mg by mouth every 8 (eight) hours as needed for nausea or vomiting.     . polyethylene glycol (MIRALAX / GLYCOLAX) 17 g packet Take 17 g by mouth daily as needed for mild constipation.     . potassium chloride (K-DUR) 10 MEQ tablet Take 10 mEq by mouth every evening.     . promethazine (PHENERGAN) 25 MG tablet Take 1 tablet (25 mg total) by mouth every 6 (six) hours as needed for nausea or vomiting. 20 tablet 0  . simvastatin (ZOCOR) 20 MG tablet Take 1 tablet (20 mg total) by mouth every evening. (Patient taking differently: Take 20 mg by mouth at bedtime. ) 90 tablet 1  . traMADol (ULTRAM) 50 MG tablet Take 50 mg by mouth every 12 (twelve) hours as needed (pain).     . Vitamin D, Ergocalciferol, (DRISDOL) 1.25 MG (50000 UT) CAPS capsule Take 50,000 Units by mouth every Thursday.     No current facility-administered medications for this visit.    Allergies:   Ciprofloxacin, Codeine, Gabapentin, Sulfa antibiotics, Sulfonamide derivatives, Wellbutrin [bupropion], and Zolpidem    Social History:  The patient  reports that she has never smoked. She has never used smokeless tobacco. She reports that she does not drink alcohol or use drugs.   Family History:  The patient's family history includes Heart failure in  her mother.    ROS:  Please see the history of present illness.   Otherwise, review of systems are positive for none.   All other systems are reviewed and negative.    PHYSICAL EXAM: VS:  BP (!) 150/77   Pulse 75   Temp (!) 97.3 F (36.3 C)   Ht 5\' 4"  (1.626 m)   Wt 179 lb 12.8 oz (81.6 kg)   SpO2 97%   BMI 30.86 kg/m  , BMI Body mass index is 30.86 kg/m. GEN: Well nourished, well developed, in no acute distress  HEENT: normal  Neck: no JVD, carotid bruits, or masses Cardiac: RRR; no rubs, or gallops,no edema . 2/6 systolic ejection murmur in the aortic area. Respiratory:  clear to auscultation bilaterally,  normal work of breathing GI: soft, nontender, nondistended, + BS MS: no deformity or atrophy  Skin: warm and dry, no rash Neuro:  Strength and sensation are intact Psych: euthymic mood, full affect    EKG:  EKG is  ordered today. EKG showed normal sinus rhythm with nonspecific T wave changes.    Recent Labs: 05/25/2019: BUN 9; Creatinine, Ser 1.01; Platelets 227 06/02/2019: Hemoglobin 12.2; Potassium 3.5; Sodium 139    Lipid Panel    Component Value Date/Time   CHOL 168 06/01/2016 0333   TRIG 145 06/01/2016 0333   HDL 55 06/01/2016 0333   CHOLHDL 3.1 06/01/2016 0333   VLDL 29 06/01/2016 0333   LDLCALC 84 06/01/2016 0333      Wt Readings from Last 3 Encounters:  01/04/20 179 lb 12.8 oz (81.6 kg)  07/06/19 182 lb (82.6 kg)  06/02/19 181 lb (82.1 kg)       No flowsheet data found.    ASSESSMENT AND PLAN:  1.  Coronary artery disease involving native coronary arteries without angina: Cardiac catheterization last year showed stable coronary anatomy overall with no obstructive disease other than in a small second diagonal for which I recommend continued medical therapy.    2.    Aortic stenosis: Mild aortic stenosis.  Recommend a follow-up echocardiogram in 1 year from now.  3. Essential hypertension: Blood pressure continues to be elevated but she  attributes that to recent taking of Sudafed and Kenalog shot.  Currently on Toprol and amlodipine.  Her blood pressure has been managed by her primary care physician.  I recommend considering an ARB.  4. Hyperlipidemia: Continue treatment with simvastatin.  This is being managed by her primary care physician.  I recommend a target LDL of less than 70.    Disposition:   FU with me in 12 months  Signed,  Kathlyn Sacramento, MD  01/04/2020 11:38 AM    [place]

## 2020-01-04 NOTE — Patient Instructions (Signed)
Medication Instructions:  No changes *If you need a refill on your cardiac medications before your next appointment, please call your pharmacy*   Lab Work: None ordered If you have labs (blood work) drawn today and your tests are completely normal, you will receive your results only by: Marland Kitchen MyChart Message (if you have MyChart) OR . A paper copy in the mail If you have any lab test that is abnormal or we need to change your treatment, we will call you to review the results.   Testing/Procedures: Your physician has requested that you have an echocardiogram in 12 months. Echocardiography is a painless test that uses sound waves to create images of your heart. It provides your doctor with information about the size and shape of your heart and how well your heart's chambers and valves are working. You may receive an ultrasound enhancing agent through an IV if needed to better visualize your heart during the echo.This procedure takes approximately one hour. There are no restrictions for this procedure. This will take place at the [address].     Follow-Up: At [facility], you and your health needs are our priority.  As part of our continuing mission to provide you with exceptional heart care, we have created designated Provider Care Teams.  These Care Teams include your primary Cardiologist (physician) and Advanced Practice Providers (APPs -  Physician Assistants and Nurse Practitioners) who all work together to provide you with the care you need, when you need it.  We recommend signing up for the patient portal called "MyChart".  Sign up information is provided on this After Visit Summary.  MyChart is used to connect with patients for Virtual Visits (Telemedicine).  Patients are able to view lab/test results, encounter notes, upcoming appointments, etc.  Non-urgent messages can be sent to your provider as well.   To learn more about what you can do with MyChart, go to  [email or web address].    Your next appointment:   12 month(s)  The format for your next appointment:   In Person  Provider:   Kathlyn Sacramento, MD

## 2020-02-23 DIAGNOSIS — Z7689 Persons encountering health services in other specified circumstances: Secondary | ICD-10-CM | POA: Diagnosis not present

## 2020-02-23 DIAGNOSIS — Z1331 Encounter for screening for depression: Secondary | ICD-10-CM | POA: Diagnosis not present

## 2020-02-23 DIAGNOSIS — Z136 Encounter for screening for cardiovascular disorders: Secondary | ICD-10-CM | POA: Diagnosis not present

## 2020-02-23 DIAGNOSIS — E785 Hyperlipidemia, unspecified: Secondary | ICD-10-CM | POA: Diagnosis not present

## 2020-02-23 DIAGNOSIS — Z1339 Encounter for screening examination for other mental health and behavioral disorders: Secondary | ICD-10-CM | POA: Diagnosis not present

## 2020-02-23 DIAGNOSIS — Z139 Encounter for screening, unspecified: Secondary | ICD-10-CM | POA: Diagnosis not present

## 2020-02-23 DIAGNOSIS — I10 Essential (primary) hypertension: Secondary | ICD-10-CM | POA: Diagnosis not present

## 2020-02-23 DIAGNOSIS — K219 Gastro-esophageal reflux disease without esophagitis: Secondary | ICD-10-CM | POA: Diagnosis not present

## 2020-02-23 DIAGNOSIS — Z0000 Encounter for general adult medical examination without abnormal findings: Secondary | ICD-10-CM | POA: Diagnosis not present

## 2020-03-08 DIAGNOSIS — M545 Low back pain: Secondary | ICD-10-CM | POA: Diagnosis not present

## 2020-03-08 DIAGNOSIS — M5116 Intervertebral disc disorders with radiculopathy, lumbar region: Secondary | ICD-10-CM | POA: Diagnosis not present

## 2020-03-09 DIAGNOSIS — H5203 Hypermetropia, bilateral: Secondary | ICD-10-CM | POA: Diagnosis not present

## 2020-03-09 DIAGNOSIS — H353132 Nonexudative age-related macular degeneration, bilateral, intermediate dry stage: Secondary | ICD-10-CM | POA: Diagnosis not present

## 2020-03-09 DIAGNOSIS — H35373 Puckering of macula, bilateral: Secondary | ICD-10-CM | POA: Diagnosis not present

## 2020-03-09 DIAGNOSIS — Z961 Presence of intraocular lens: Secondary | ICD-10-CM | POA: Diagnosis not present

## 2020-03-16 DIAGNOSIS — E785 Hyperlipidemia, unspecified: Secondary | ICD-10-CM | POA: Diagnosis not present

## 2020-03-16 DIAGNOSIS — E039 Hypothyroidism, unspecified: Secondary | ICD-10-CM | POA: Diagnosis not present

## 2020-03-23 DIAGNOSIS — I714 Abdominal aortic aneurysm, without rupture: Secondary | ICD-10-CM | POA: Diagnosis not present

## 2020-03-23 DIAGNOSIS — I25119 Atherosclerotic heart disease of native coronary artery with unspecified angina pectoris: Secondary | ICD-10-CM | POA: Diagnosis not present

## 2020-03-23 DIAGNOSIS — N183 Chronic kidney disease, stage 3 unspecified: Secondary | ICD-10-CM | POA: Diagnosis not present

## 2020-03-23 DIAGNOSIS — F339 Major depressive disorder, recurrent, unspecified: Secondary | ICD-10-CM | POA: Diagnosis not present

## 2020-03-24 DIAGNOSIS — M545 Low back pain: Secondary | ICD-10-CM | POA: Diagnosis not present

## 2020-03-24 DIAGNOSIS — G2581 Restless legs syndrome: Secondary | ICD-10-CM | POA: Diagnosis not present

## 2020-03-24 DIAGNOSIS — G629 Polyneuropathy, unspecified: Secondary | ICD-10-CM | POA: Diagnosis not present

## 2020-03-24 DIAGNOSIS — F5104 Psychophysiologic insomnia: Secondary | ICD-10-CM | POA: Diagnosis not present

## 2020-04-18 DIAGNOSIS — J309 Allergic rhinitis, unspecified: Secondary | ICD-10-CM | POA: Diagnosis not present

## 2020-04-18 DIAGNOSIS — J329 Chronic sinusitis, unspecified: Secondary | ICD-10-CM | POA: Diagnosis not present

## 2020-04-18 DIAGNOSIS — Z6831 Body mass index (BMI) 31.0-31.9, adult: Secondary | ICD-10-CM | POA: Diagnosis not present

## 2020-05-13 DIAGNOSIS — H9209 Otalgia, unspecified ear: Secondary | ICD-10-CM | POA: Diagnosis not present

## 2020-05-30 DIAGNOSIS — M25561 Pain in right knee: Secondary | ICD-10-CM | POA: Diagnosis not present

## 2020-05-30 DIAGNOSIS — M25562 Pain in left knee: Secondary | ICD-10-CM | POA: Diagnosis not present

## 2020-05-31 DIAGNOSIS — G2581 Restless legs syndrome: Secondary | ICD-10-CM | POA: Diagnosis not present

## 2020-05-31 DIAGNOSIS — M545 Low back pain: Secondary | ICD-10-CM | POA: Diagnosis not present

## 2020-05-31 DIAGNOSIS — G629 Polyneuropathy, unspecified: Secondary | ICD-10-CM | POA: Diagnosis not present

## 2020-05-31 DIAGNOSIS — G4700 Insomnia, unspecified: Secondary | ICD-10-CM | POA: Diagnosis not present

## 2020-07-14 DIAGNOSIS — J019 Acute sinusitis, unspecified: Secondary | ICD-10-CM | POA: Diagnosis not present

## 2020-07-14 DIAGNOSIS — R051 Acute cough: Secondary | ICD-10-CM | POA: Diagnosis not present

## 2020-07-24 DIAGNOSIS — E785 Hyperlipidemia, unspecified: Secondary | ICD-10-CM | POA: Diagnosis not present

## 2020-07-24 DIAGNOSIS — E039 Hypothyroidism, unspecified: Secondary | ICD-10-CM | POA: Diagnosis not present

## 2020-07-31 DIAGNOSIS — Z139 Encounter for screening, unspecified: Secondary | ICD-10-CM | POA: Diagnosis not present

## 2020-07-31 DIAGNOSIS — E785 Hyperlipidemia, unspecified: Secondary | ICD-10-CM | POA: Diagnosis not present

## 2020-07-31 DIAGNOSIS — E039 Hypothyroidism, unspecified: Secondary | ICD-10-CM | POA: Diagnosis not present

## 2020-07-31 DIAGNOSIS — I10 Essential (primary) hypertension: Secondary | ICD-10-CM | POA: Diagnosis not present

## 2020-07-31 DIAGNOSIS — G4700 Insomnia, unspecified: Secondary | ICD-10-CM | POA: Diagnosis not present

## 2020-10-03 DIAGNOSIS — Z1152 Encounter for screening for COVID-19: Secondary | ICD-10-CM | POA: Diagnosis not present

## 2020-10-03 DIAGNOSIS — Z20822 Contact with and (suspected) exposure to covid-19: Secondary | ICD-10-CM | POA: Diagnosis not present

## 2020-10-03 DIAGNOSIS — J329 Chronic sinusitis, unspecified: Secondary | ICD-10-CM | POA: Diagnosis not present

## 2020-10-30 DIAGNOSIS — E039 Hypothyroidism, unspecified: Secondary | ICD-10-CM | POA: Diagnosis not present

## 2020-10-30 DIAGNOSIS — E785 Hyperlipidemia, unspecified: Secondary | ICD-10-CM | POA: Diagnosis not present

## 2020-10-31 DIAGNOSIS — H35373 Puckering of macula, bilateral: Secondary | ICD-10-CM | POA: Diagnosis not present

## 2020-10-31 DIAGNOSIS — H353132 Nonexudative age-related macular degeneration, bilateral, intermediate dry stage: Secondary | ICD-10-CM | POA: Diagnosis not present

## 2020-10-31 DIAGNOSIS — Z961 Presence of intraocular lens: Secondary | ICD-10-CM | POA: Diagnosis not present

## 2020-11-07 DIAGNOSIS — N183 Chronic kidney disease, stage 3 unspecified: Secondary | ICD-10-CM | POA: Diagnosis not present

## 2020-11-07 DIAGNOSIS — E785 Hyperlipidemia, unspecified: Secondary | ICD-10-CM | POA: Diagnosis not present

## 2020-11-07 DIAGNOSIS — R7301 Impaired fasting glucose: Secondary | ICD-10-CM | POA: Diagnosis not present

## 2020-11-07 DIAGNOSIS — I10 Essential (primary) hypertension: Secondary | ICD-10-CM | POA: Diagnosis not present

## 2020-11-07 DIAGNOSIS — E039 Hypothyroidism, unspecified: Secondary | ICD-10-CM | POA: Diagnosis not present

## 2020-11-23 DIAGNOSIS — I10 Essential (primary) hypertension: Secondary | ICD-10-CM | POA: Diagnosis not present

## 2020-11-23 DIAGNOSIS — Z6832 Body mass index (BMI) 32.0-32.9, adult: Secondary | ICD-10-CM | POA: Diagnosis not present

## 2020-11-23 DIAGNOSIS — R7301 Impaired fasting glucose: Secondary | ICD-10-CM | POA: Diagnosis not present

## 2020-11-23 DIAGNOSIS — E785 Hyperlipidemia, unspecified: Secondary | ICD-10-CM | POA: Diagnosis not present

## 2020-12-05 DIAGNOSIS — D485 Neoplasm of uncertain behavior of skin: Secondary | ICD-10-CM | POA: Diagnosis not present

## 2020-12-05 DIAGNOSIS — L570 Actinic keratosis: Secondary | ICD-10-CM | POA: Diagnosis not present

## 2020-12-18 ENCOUNTER — Telehealth: Payer: Self-pay | Admitting: Cardiovascular Disease

## 2020-12-18 DIAGNOSIS — F5104 Psychophysiologic insomnia: Secondary | ICD-10-CM | POA: Diagnosis not present

## 2020-12-18 DIAGNOSIS — M545 Low back pain, unspecified: Secondary | ICD-10-CM | POA: Diagnosis not present

## 2020-12-18 DIAGNOSIS — G2581 Restless legs syndrome: Secondary | ICD-10-CM | POA: Diagnosis not present

## 2020-12-18 NOTE — Telephone Encounter (Signed)
Patient called and wanted to know if there was someway she can get her echo and appt on the same day. Please call back

## 2020-12-18 NOTE — Telephone Encounter (Signed)
Message sent to scheduling to see if the patient can have her echo scheduled on a Tuesday with follow up with Dr. Fletcher Anon on the same day. She lives [address]

## 2020-12-22 DIAGNOSIS — Z1231 Encounter for screening mammogram for malignant neoplasm of breast: Secondary | ICD-10-CM | POA: Diagnosis not present

## 2020-12-22 DIAGNOSIS — E2839 Other primary ovarian failure: Secondary | ICD-10-CM | POA: Diagnosis not present

## 2020-12-22 DIAGNOSIS — M8589 Other specified disorders of bone density and structure, multiple sites: Secondary | ICD-10-CM | POA: Diagnosis not present

## 2021-01-09 ENCOUNTER — Ambulatory Visit: Payer: Medicare HMO | Admitting: Cardiovascular Disease

## 2021-01-15 ENCOUNTER — Other Ambulatory Visit (HOSPITAL_COMMUNITY): Payer: Medicare HMO

## 2021-01-30 ENCOUNTER — Ambulatory Visit: Payer: Medicare HMO | Admitting: Cardiovascular Disease

## 2021-01-30 ENCOUNTER — Other Ambulatory Visit (HOSPITAL_COMMUNITY): Payer: Medicare HMO

## 2021-02-15 DIAGNOSIS — M1711 Unilateral primary osteoarthritis, right knee: Secondary | ICD-10-CM | POA: Diagnosis not present

## 2021-02-16 DIAGNOSIS — M1711 Unilateral primary osteoarthritis, right knee: Secondary | ICD-10-CM | POA: Diagnosis not present

## 2021-02-20 ENCOUNTER — Ambulatory Visit: Payer: Medicare HMO | Admitting: Cardiovascular Disease

## 2021-02-22 DIAGNOSIS — E785 Hyperlipidemia, unspecified: Secondary | ICD-10-CM | POA: Diagnosis not present

## 2021-02-22 DIAGNOSIS — E039 Hypothyroidism, unspecified: Secondary | ICD-10-CM | POA: Diagnosis not present

## 2021-03-02 DIAGNOSIS — E785 Hyperlipidemia, unspecified: Secondary | ICD-10-CM | POA: Diagnosis not present

## 2021-03-02 DIAGNOSIS — Z1331 Encounter for screening for depression: Secondary | ICD-10-CM | POA: Diagnosis not present

## 2021-03-02 DIAGNOSIS — E669 Obesity, unspecified: Secondary | ICD-10-CM | POA: Diagnosis not present

## 2021-03-02 DIAGNOSIS — I10 Essential (primary) hypertension: Secondary | ICD-10-CM | POA: Diagnosis not present

## 2021-03-02 DIAGNOSIS — Z1339 Encounter for screening examination for other mental health and behavioral disorders: Secondary | ICD-10-CM | POA: Diagnosis not present

## 2021-03-02 DIAGNOSIS — F339 Major depressive disorder, recurrent, unspecified: Secondary | ICD-10-CM | POA: Diagnosis not present

## 2021-03-02 DIAGNOSIS — D692 Other nonthrombocytopenic purpura: Secondary | ICD-10-CM | POA: Diagnosis not present

## 2021-03-02 DIAGNOSIS — Z139 Encounter for screening, unspecified: Secondary | ICD-10-CM | POA: Diagnosis not present

## 2021-03-02 DIAGNOSIS — Z0000 Encounter for general adult medical examination without abnormal findings: Secondary | ICD-10-CM | POA: Diagnosis not present

## 2021-03-02 DIAGNOSIS — N1831 Chronic kidney disease, stage 3a: Secondary | ICD-10-CM | POA: Diagnosis not present

## 2021-03-02 DIAGNOSIS — E039 Hypothyroidism, unspecified: Secondary | ICD-10-CM | POA: Diagnosis not present

## 2021-03-06 ENCOUNTER — Ambulatory Visit: Payer: Medicare HMO | Admitting: Cardiovascular Disease

## 2021-03-20 ENCOUNTER — Other Ambulatory Visit (HOSPITAL_COMMUNITY): Payer: Medicare HMO

## 2021-03-27 ENCOUNTER — Ambulatory Visit: Payer: Medicare HMO | Admitting: Cardiovascular Disease

## 2021-04-10 DIAGNOSIS — S60561A Insect bite (nonvenomous) of right hand, initial encounter: Secondary | ICD-10-CM | POA: Diagnosis not present

## 2021-04-13 DIAGNOSIS — E039 Hypothyroidism, unspecified: Secondary | ICD-10-CM | POA: Diagnosis not present

## 2021-04-13 DIAGNOSIS — N1831 Chronic kidney disease, stage 3a: Secondary | ICD-10-CM | POA: Diagnosis not present

## 2021-04-14 DIAGNOSIS — M47812 Spondylosis without myelopathy or radiculopathy, cervical region: Secondary | ICD-10-CM | POA: Diagnosis not present

## 2021-04-14 DIAGNOSIS — M4312 Spondylolisthesis, cervical region: Secondary | ICD-10-CM | POA: Diagnosis not present

## 2021-04-14 DIAGNOSIS — S0011XA Contusion of right eyelid and periocular area, initial encounter: Secondary | ICD-10-CM | POA: Diagnosis not present

## 2021-04-14 DIAGNOSIS — Z043 Encounter for examination and observation following other accident: Secondary | ICD-10-CM | POA: Diagnosis not present

## 2021-04-14 DIAGNOSIS — I10 Essential (primary) hypertension: Secondary | ICD-10-CM | POA: Diagnosis not present

## 2021-04-14 DIAGNOSIS — Y998 Other external cause status: Secondary | ICD-10-CM | POA: Diagnosis not present

## 2021-04-14 DIAGNOSIS — S0003XA Contusion of scalp, initial encounter: Secondary | ICD-10-CM | POA: Diagnosis not present

## 2021-04-14 DIAGNOSIS — S8001XA Contusion of right knee, initial encounter: Secondary | ICD-10-CM | POA: Diagnosis not present

## 2021-04-14 DIAGNOSIS — M1711 Unilateral primary osteoarthritis, right knee: Secondary | ICD-10-CM | POA: Diagnosis not present

## 2021-04-14 DIAGNOSIS — W1839XA Other fall on same level, initial encounter: Secondary | ICD-10-CM | POA: Diagnosis not present

## 2021-04-14 DIAGNOSIS — Z981 Arthrodesis status: Secondary | ICD-10-CM | POA: Diagnosis not present

## 2021-04-14 DIAGNOSIS — S20212A Contusion of left front wall of thorax, initial encounter: Secondary | ICD-10-CM | POA: Diagnosis not present

## 2021-04-14 DIAGNOSIS — H7491 Unspecified disorder of right middle ear and mastoid: Secondary | ICD-10-CM | POA: Diagnosis not present

## 2021-04-14 DIAGNOSIS — G319 Degenerative disease of nervous system, unspecified: Secondary | ICD-10-CM | POA: Diagnosis not present

## 2021-04-14 DIAGNOSIS — I700 Atherosclerosis of aorta: Secondary | ICD-10-CM | POA: Diagnosis not present

## 2021-04-14 DIAGNOSIS — M4322 Fusion of spine, cervical region: Secondary | ICD-10-CM | POA: Diagnosis not present

## 2021-04-14 DIAGNOSIS — I2510 Atherosclerotic heart disease of native coronary artery without angina pectoris: Secondary | ICD-10-CM | POA: Diagnosis not present

## 2021-04-14 DIAGNOSIS — S0511XA Contusion of eyeball and orbital tissues, right eye, initial encounter: Secondary | ICD-10-CM | POA: Diagnosis not present

## 2021-04-14 DIAGNOSIS — W19XXXA Unspecified fall, initial encounter: Secondary | ICD-10-CM | POA: Diagnosis not present

## 2021-04-14 DIAGNOSIS — H05221 Edema of right orbit: Secondary | ICD-10-CM | POA: Diagnosis not present

## 2021-04-14 DIAGNOSIS — R519 Headache, unspecified: Secondary | ICD-10-CM | POA: Diagnosis not present

## 2021-04-20 DIAGNOSIS — S0083XD Contusion of other part of head, subsequent encounter: Secondary | ICD-10-CM | POA: Diagnosis not present

## 2021-04-20 DIAGNOSIS — S20211D Contusion of right front wall of thorax, subsequent encounter: Secondary | ICD-10-CM | POA: Diagnosis not present

## 2021-04-20 DIAGNOSIS — S20212D Contusion of left front wall of thorax, subsequent encounter: Secondary | ICD-10-CM | POA: Diagnosis not present

## 2021-04-20 DIAGNOSIS — S20213D Contusion of bilateral front wall of thorax, subsequent encounter: Secondary | ICD-10-CM | POA: Diagnosis not present

## 2021-04-20 DIAGNOSIS — W19XXXS Unspecified fall, sequela: Secondary | ICD-10-CM | POA: Diagnosis not present

## 2021-04-20 DIAGNOSIS — I10 Essential (primary) hypertension: Secondary | ICD-10-CM | POA: Diagnosis not present

## 2021-04-24 ENCOUNTER — Other Ambulatory Visit (HOSPITAL_COMMUNITY): Payer: Medicare HMO

## 2021-04-24 ENCOUNTER — Ambulatory Visit: Payer: Medicare HMO | Admitting: Cardiovascular Disease

## 2021-04-25 ENCOUNTER — Other Ambulatory Visit: Payer: Self-pay

## 2021-04-25 ENCOUNTER — Ambulatory Visit (HOSPITAL_COMMUNITY)
Admission: RE | Admit: 2021-04-25 | Discharge: 2021-04-25 | Disposition: A | Discharge status: Home / Self Care | Payer: Medicare HMO | Source: Ambulatory Visit | Attending: Cardiovascular Disease | Admitting: Cardiovascular Disease

## 2021-04-25 DIAGNOSIS — I517 Cardiomegaly: Secondary | ICD-10-CM | POA: Insufficient documentation

## 2021-04-25 DIAGNOSIS — E785 Hyperlipidemia, unspecified: Secondary | ICD-10-CM | POA: Diagnosis not present

## 2021-04-25 DIAGNOSIS — I359 Nonrheumatic aortic valve disorder, unspecified: Secondary | ICD-10-CM | POA: Diagnosis not present

## 2021-04-25 DIAGNOSIS — I350 Nonrheumatic aortic (valve) stenosis: Secondary | ICD-10-CM | POA: Insufficient documentation

## 2021-04-25 DIAGNOSIS — I2510 Atherosclerotic heart disease of native coronary artery without angina pectoris: Secondary | ICD-10-CM | POA: Diagnosis not present

## 2021-04-25 LAB — ECHOCARDIOGRAM COMPLETE
AR max vel: 2.04 cm2
AV Area VTI: 2.15 cm2
AV Area mean vel: 2.09 cm2
AV Mean grad: 10.3 mmHg
AV Peak grad: 19.3 mmHg
Ao pk vel: 2.2 m/s
Area-P 1/2: 2.99 cm2
Calc EF: 53.8 %
S' Lateral: 2.5 cm
Single Plane A2C EF: 53.9 %
Single Plane A4C EF: 56 %

## 2021-05-07 DIAGNOSIS — H35373 Puckering of macula, bilateral: Secondary | ICD-10-CM | POA: Diagnosis not present

## 2021-05-07 DIAGNOSIS — H52221 Regular astigmatism, right eye: Secondary | ICD-10-CM | POA: Diagnosis not present

## 2021-05-07 DIAGNOSIS — H524 Presbyopia: Secondary | ICD-10-CM | POA: Diagnosis not present

## 2021-05-07 DIAGNOSIS — Z961 Presence of intraocular lens: Secondary | ICD-10-CM | POA: Diagnosis not present

## 2021-05-07 DIAGNOSIS — H353132 Nonexudative age-related macular degeneration, bilateral, intermediate dry stage: Secondary | ICD-10-CM | POA: Diagnosis not present

## 2021-05-22 ENCOUNTER — Ambulatory Visit: Payer: Medicare HMO | Admitting: Cardiovascular Disease

## 2021-05-22 ENCOUNTER — Other Ambulatory Visit: Payer: Self-pay

## 2021-05-22 ENCOUNTER — Encounter: Payer: Self-pay | Admitting: Cardiovascular Disease

## 2021-05-22 VITALS — BP 140/80 | HR 88 | Ht 64.0 in | Wt 193.0 lb

## 2021-05-22 DIAGNOSIS — I10 Essential (primary) hypertension: Secondary | ICD-10-CM | POA: Diagnosis not present

## 2021-05-22 DIAGNOSIS — I2510 Atherosclerotic heart disease of native coronary artery without angina pectoris: Secondary | ICD-10-CM | POA: Diagnosis not present

## 2021-05-22 DIAGNOSIS — E785 Hyperlipidemia, unspecified: Secondary | ICD-10-CM | POA: Diagnosis not present

## 2021-05-22 DIAGNOSIS — Z9861 Coronary angioplasty status: Secondary | ICD-10-CM | POA: Diagnosis not present

## 2021-05-22 DIAGNOSIS — I350 Nonrheumatic aortic (valve) stenosis: Secondary | ICD-10-CM | POA: Diagnosis not present
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
## 2021-05-22 NOTE — Patient Instructions (Signed)
Medication Instructions:  Your physician recommends that you continue on your current medications as directed. Please refer to the Current Medication list given to you today.  *If you need a refill on your cardiac medications before your next appointment, please call your pharmacy*   Lab Work: none If you have labs (blood work) drawn today and your tests are completely normal, you will receive your results only by: Dane (if you have MyChart) OR A paper copy in the mail If you have any lab test that is abnormal or we need to change your treatment, we will call you to review the results.   Testing/Procedures: none   Follow-Up: At [facility], you and your health needs are our priority.  As part of our continuing mission to provide you with exceptional heart care, we have created designated Provider Care Teams.  These Care Teams include your primary Cardiologist (physician) and Advanced Practice Providers (APPs -  Physician Assistants and Nurse Practitioners) who all work together to provide you with the care you need, when you need it.  We recommend signing up for the patient portal called "MyChart".  Sign up information is provided on this After Visit Summary.  MyChart is used to connect with patients for Virtual Visits (Telemedicine).  Patients are able to view lab/test results, encounter notes, upcoming appointments, etc.  Non-urgent messages can be sent to your provider as well.   To learn more about what you can do with MyChart, go to [email or web address].    Your next appointment:   1 year(s)  The format for your next appointment:   In Person  Provider:   Kathlyn Sacramento, MD   Other Instructions

## 2021-07-16 DIAGNOSIS — H903 Sensorineural hearing loss, bilateral: Secondary | ICD-10-CM | POA: Diagnosis not present

## 2021-07-20 DIAGNOSIS — H903 Sensorineural hearing loss, bilateral: Secondary | ICD-10-CM | POA: Diagnosis not present

## 2021-07-24 DIAGNOSIS — R0902 Hypoxemia: Secondary | ICD-10-CM | POA: Diagnosis not present

## 2021-07-24 DIAGNOSIS — K219 Gastro-esophageal reflux disease without esophagitis: Secondary | ICD-10-CM | POA: Diagnosis not present

## 2021-07-24 DIAGNOSIS — Z20822 Contact with and (suspected) exposure to covid-19: Secondary | ICD-10-CM | POA: Diagnosis not present

## 2021-07-24 DIAGNOSIS — Z1152 Encounter for screening for COVID-19: Secondary | ICD-10-CM | POA: Diagnosis not present

## 2021-07-24 DIAGNOSIS — E876 Hypokalemia: Secondary | ICD-10-CM | POA: Diagnosis not present

## 2021-07-24 DIAGNOSIS — J449 Chronic obstructive pulmonary disease, unspecified: Secondary | ICD-10-CM | POA: Diagnosis not present

## 2021-07-24 DIAGNOSIS — I10 Essential (primary) hypertension: Secondary | ICD-10-CM | POA: Diagnosis not present

## 2021-07-24 DIAGNOSIS — R000 Tachycardia, unspecified: Secondary | ICD-10-CM | POA: Diagnosis not present

## 2021-07-24 DIAGNOSIS — I499 Cardiac arrhythmia, unspecified: Secondary | ICD-10-CM | POA: Diagnosis not present

## 2021-07-24 DIAGNOSIS — R0602 Shortness of breath: Secondary | ICD-10-CM | POA: Diagnosis not present

## 2021-07-24 DIAGNOSIS — I4891 Unspecified atrial fibrillation: Secondary | ICD-10-CM | POA: Diagnosis not present

## 2021-07-24 DIAGNOSIS — I2510 Atherosclerotic heart disease of native coronary artery without angina pectoris: Secondary | ICD-10-CM | POA: Diagnosis not present

## 2021-07-24 DIAGNOSIS — J020 Streptococcal pharyngitis: Secondary | ICD-10-CM | POA: Diagnosis not present

## 2021-07-24 DIAGNOSIS — E039 Hypothyroidism, unspecified: Secondary | ICD-10-CM | POA: Diagnosis not present

## 2021-07-25 DIAGNOSIS — E039 Hypothyroidism, unspecified: Secondary | ICD-10-CM | POA: Diagnosis not present

## 2021-07-25 DIAGNOSIS — E876 Hypokalemia: Secondary | ICD-10-CM | POA: Diagnosis not present

## 2021-07-25 DIAGNOSIS — K219 Gastro-esophageal reflux disease without esophagitis: Secondary | ICD-10-CM | POA: Diagnosis not present

## 2021-07-25 DIAGNOSIS — I2510 Atherosclerotic heart disease of native coronary artery without angina pectoris: Secondary | ICD-10-CM | POA: Diagnosis not present

## 2021-07-25 DIAGNOSIS — I10 Essential (primary) hypertension: Secondary | ICD-10-CM | POA: Diagnosis not present

## 2021-07-25 DIAGNOSIS — J449 Chronic obstructive pulmonary disease, unspecified: Secondary | ICD-10-CM | POA: Diagnosis not present

## 2021-07-25 DIAGNOSIS — J020 Streptococcal pharyngitis: Secondary | ICD-10-CM | POA: Diagnosis not present

## 2021-07-25 DIAGNOSIS — R000 Tachycardia, unspecified: Secondary | ICD-10-CM | POA: Diagnosis not present

## 2021-07-25 DIAGNOSIS — I4891 Unspecified atrial fibrillation: Secondary | ICD-10-CM | POA: Diagnosis not present

## 2021-07-25 DIAGNOSIS — R0602 Shortness of breath: Secondary | ICD-10-CM | POA: Diagnosis not present

## 2021-07-26 DIAGNOSIS — I2510 Atherosclerotic heart disease of native coronary artery without angina pectoris: Secondary | ICD-10-CM | POA: Diagnosis not present

## 2021-07-26 DIAGNOSIS — E876 Hypokalemia: Secondary | ICD-10-CM | POA: Diagnosis not present

## 2021-07-26 DIAGNOSIS — J020 Streptococcal pharyngitis: Secondary | ICD-10-CM | POA: Diagnosis not present

## 2021-07-27 ENCOUNTER — Other Ambulatory Visit: Payer: Self-pay

## 2021-07-27 NOTE — Patient Outreach (Signed)
[facility]) Care Management  07/27/2021  Janice Glass 12-14-1940 [id number]     Transition of Care Referral  Referral Date: 07/27/2021 Referral Source: Discharge Report Date of Discharge: 07/26/2021 Facility:[facility]    Referral received. No outreach warranted at this time. TOC will be completed by primary care provider office who will refer to [facility] care mgmt if needed.     Plan: RN CM will close referral.   Enzo Montgomery, RN,BSN,CCM [facility] Management Telephonic Care Management Coordinator Direct Phone: [phone] Toll Free: [phone] Fax: [phone]

## 2021-08-02 DIAGNOSIS — Z6832 Body mass index (BMI) 32.0-32.9, adult: Secondary | ICD-10-CM | POA: Diagnosis not present

## 2021-08-02 DIAGNOSIS — E039 Hypothyroidism, unspecified: Secondary | ICD-10-CM | POA: Diagnosis not present

## 2021-08-02 DIAGNOSIS — E876 Hypokalemia: Secondary | ICD-10-CM | POA: Diagnosis not present

## 2021-08-02 DIAGNOSIS — I10 Essential (primary) hypertension: Secondary | ICD-10-CM | POA: Diagnosis not present

## 2021-08-02 DIAGNOSIS — E785 Hyperlipidemia, unspecified: Secondary | ICD-10-CM | POA: Diagnosis not present

## 2021-08-02 DIAGNOSIS — R000 Tachycardia, unspecified: Secondary | ICD-10-CM | POA: Diagnosis not present

## 2021-08-02 DIAGNOSIS — Z7689 Persons encountering health services in other specified circumstances: Secondary | ICD-10-CM | POA: Diagnosis not present

## 2021-08-02 DIAGNOSIS — Z789 Other specified health status: Secondary | ICD-10-CM | POA: Diagnosis not present

## 2021-08-10 DIAGNOSIS — I10 Essential (primary) hypertension: Secondary | ICD-10-CM | POA: Diagnosis not present

## 2021-08-10 DIAGNOSIS — R053 Chronic cough: Secondary | ICD-10-CM | POA: Diagnosis not present

## 2021-08-10 DIAGNOSIS — I714 Abdominal aortic aneurysm, without rupture, unspecified: Secondary | ICD-10-CM | POA: Diagnosis not present

## 2021-08-10 DIAGNOSIS — Z6832 Body mass index (BMI) 32.0-32.9, adult: Secondary | ICD-10-CM | POA: Diagnosis not present

## 2021-08-22 DIAGNOSIS — I10 Essential (primary) hypertension: Secondary | ICD-10-CM | POA: Diagnosis not present

## 2021-08-22 DIAGNOSIS — G2581 Restless legs syndrome: Secondary | ICD-10-CM | POA: Diagnosis not present

## 2021-08-22 DIAGNOSIS — M5441 Lumbago with sciatica, right side: Secondary | ICD-10-CM | POA: Diagnosis not present

## 2021-08-23 DIAGNOSIS — I10 Essential (primary) hypertension: Secondary | ICD-10-CM | POA: Diagnosis not present

## 2021-08-23 DIAGNOSIS — N1831 Chronic kidney disease, stage 3a: Secondary | ICD-10-CM | POA: Diagnosis not present

## 2021-09-04 ENCOUNTER — Ambulatory Visit (INDEPENDENT_AMBULATORY_CARE_PROVIDER_SITE_OTHER): Payer: Medicare HMO

## 2021-09-04 ENCOUNTER — Other Ambulatory Visit: Payer: Self-pay

## 2021-09-04 ENCOUNTER — Encounter: Payer: Self-pay | Admitting: Cardiovascular Disease

## 2021-09-04 ENCOUNTER — Ambulatory Visit: Payer: Medicare HMO | Admitting: Cardiovascular Disease

## 2021-09-04 VITALS — BP 153/84 | HR 98 | Ht 64.0 in | Wt 204.0 lb

## 2021-09-04 DIAGNOSIS — E785 Hyperlipidemia, unspecified: Secondary | ICD-10-CM

## 2021-09-04 DIAGNOSIS — I2510 Atherosclerotic heart disease of native coronary artery without angina pectoris: Secondary | ICD-10-CM

## 2021-09-04 DIAGNOSIS — I350 Nonrheumatic aortic (valve) stenosis: Secondary | ICD-10-CM | POA: Diagnosis not present

## 2021-09-04 DIAGNOSIS — R002 Palpitations: Secondary | ICD-10-CM | POA: Diagnosis not present

## 2021-09-04 DIAGNOSIS — I10 Essential (primary) hypertension: Secondary | ICD-10-CM

## 2021-09-04 MED ORDER — LOSARTAN POTASSIUM 50 MG PO TABS: 50.0000 mg | ORAL_TABLET | Freq: Every day | ORAL | 3 refills | Status: DC

## 2021-09-04 NOTE — Progress Notes (Signed)
Cardiology Office Note   Date:  09/04/2021   ID:  Janice Glass, DOB 10-24-1940, MRN [id number]  PCP:  Marco Collie, MD  Cardiologist:   Kathlyn Sacramento, MD   No chief complaint on file.     History of Present Illness: Janice Glass is a 79 y.o. female who presents for a followup visit regarding coronary artery disease and aortic stenosis.   She has known history of coronary artery disease with previous drug-eluting stent placement to the LAD.  Cardiac catheterization in September 2017 showed patent LAD stent with moderate mid LAD stenosis beyond the stented segment.   She has worsening dyspnea in 2020 and thus a right and left cardiac catheterization was done in September 2020 which showed widely patent proximal LAD stent with no significant restenosis.  There was stable 50% stenosis in the distal LAD and 50% stenosis in the proximal right coronary artery with significant ostial stenosis in a small second diagonal.  Right heart catheterization was normal.  There was 15 mm peak to peak gradient across the aortic valve consistent with mild aortic stenosis. No revascularization was required.  Echocardiogram in August 2022 showed normal LV systolic function, grade 1 diastolic dysfunction and stable mild aortic stenosis with mean gradient of 11 mmHg.  She reports having a viral illness in November and was also diagnosed with strep throat.  She had some worsening shortness of breath with that but then she developed palpitations and worsening dyspnea and was hospitalized at [facility].  Records are not available but there was some concern about tachycardia and possible atrial fibrillation.  She was switched from amlodipine to diltiazem.  She reports continued palpitations.  In addition, her blood pressure has been elevated.  No chest pain.  Past Medical History:  Diagnosis Date   Anxiety    CAD (coronary artery disease)    a. 11/2009 Abnl myoview - inferobasal ischemia;  b.  12/2009 PCI/DES to pLAD w/ 3x15 mm Promus DES;  c. Relook Cath 01/08/2010: patent stent, nonobs dzs. Relook cath 02/2012: patent LAD stent with mild disease.   Depression    GERD (gastroesophageal reflux disease)    HLD (hyperlipidemia)    Hypertensive heart disease    Hypothyroidism    Mild Carotid Arterial Disease    a. 04/2015 Carotid U/S: 1-39% bilat ICA stenosis.   Mild mitral regurgitation    a. 08/2015 Echo: Ef 55-60%, no rwma, mild MR.    Past Surgical History:  Procedure Laterality Date   CARDIAC CATHETERIZATION  12/2009   CARDIAC CATHETERIZATION N/A 06/03/2016   Procedure: Left Heart Cath and Coronary Angiography;  Surgeon: Lorretta Harp, MD;  Location: [facility];  Service: Cardiovascular;  Laterality: N/A;   CESAREAN SECTION     several    CORONARY ANGIOPLASTY WITH STENT PLACEMENT  12/2009   DES to proximal LAD   CORONARY STENT PLACEMENT     LEFT HEART CATHETERIZATION WITH CORONARY ANGIOGRAM N/A 03/20/2012   Procedure: LEFT HEART CATHETERIZATION WITH CORONARY ANGIOGRAM;  Surgeon: Hillary Bow, MD;  Location: [facility] CATH LAB;  Service: Cardiovascular;  Laterality: N/A;   RIGHT/LEFT HEART CATH AND CORONARY ANGIOGRAPHY N/A 06/02/2019   Procedure: RIGHT/LEFT HEART CATH AND CORONARY ANGIOGRAPHY;  Surgeon: Wellington Hampshire, MD;  Location: [facility];  Service: Cardiovascular;  Laterality: N/A;     Current Outpatient Medications  Medication Sig Dispense Refill   acetaminophen (TYLENOL) 500 MG tablet Take 500-1,000 mg by mouth every 8 (eight)  hours as needed for mild pain or headache.     anti-nausea (EMETROL) solution Take 10 mLs by mouth every 15 (fifteen) minutes as needed for nausea or vomiting.     aspirin 81 MG EC tablet Take 1 tablet (81 mg total) by mouth daily.     diltiazem (CARTIA XT) 180 MG 24 hr capsule Take 180 mg by mouth daily.     fluticasone (FLONASE) 50 MCG/ACT nasal spray Place 2 sprays into both nostrils 2 (two) times daily.      levothyroxine  (SYNTHROID, LEVOTHROID) 50 MCG tablet Take 50 mcg by mouth daily before breakfast.     loratadine (CLARITIN) 10 MG tablet Take 10 mg by mouth daily.     losartan (COZAAR) 50 MG tablet Take 1 tablet (50 mg total) by mouth daily. 90 tablet 3   metoprolol tartrate (LOPRESSOR) 50 MG tablet Take 1 tablet (50 mg total) by mouth 2 (two) times daily. 60 tablet 6   Multiple Vitamins-Minerals (EYE VITAMINS) CAPS Take 1 capsule by mouth daily.     nitroGLYCERIN (NITROSTAT) 0.4 MG SL tablet Place 1 tablet (0.4 mg total) under the tongue every 5 (five) minutes as needed. For chest pain 25 tablet 3   omeprazole (PRILOSEC) 40 MG capsule Take 40 mg by mouth 2 (two) times daily.      ondansetron (ZOFRAN) 4 MG tablet Take 4 mg by mouth every 8 (eight) hours as needed for nausea or vomiting.      potassium chloride (K-DUR) 10 MEQ tablet Take 10 mEq by mouth every evening.      promethazine (PHENERGAN) 25 MG tablet Take 1 tablet (25 mg total) by mouth every 6 (six) hours as needed for nausea or vomiting. 20 tablet 0   rosuvastatin (CRESTOR) 20 MG tablet Take 20 mg by mouth 2 (two) times a week.     traMADol (ULTRAM) 50 MG tablet Take 50 mg by mouth every 12 (twelve) hours as needed (pain).      Vitamin D, Ergocalciferol, (DRISDOL) 1.25 MG (50000 UT) CAPS capsule Take 50,000 Units by mouth every Thursday.     No current facility-administered medications for this visit.    Allergies:   Ciprofloxacin, Codeine, Gabapentin, Sulfa antibiotics, Sulfonamide derivatives, Wellbutrin [bupropion], and Zolpidem    Social History:  The patient  reports that she has never smoked. She has never used smokeless tobacco. She reports that she does not drink alcohol and does not use drugs.   Family History:  The patient's family history includes Heart failure in her mother.    ROS:  Please see the history of present illness.   Otherwise, review of systems are positive for none.   All other systems are reviewed and negative.     PHYSICAL EXAM: VS:  BP (!) 153/84    Pulse 98    Ht 5\' 4"  (1.626 m)    Wt 204 lb (92.5 kg)    SpO2 98%    BMI 35.02 kg/m  , BMI Body mass index is 35.02 kg/m. GEN: Well nourished, well developed, in no acute distress  HEENT: normal  Neck: no JVD, carotid bruits, or masses Cardiac: RRR; no rubs, or gallops,no edema . 2/6 systolic ejection murmur in the aortic area. Respiratory:  clear to auscultation bilaterally, normal work of breathing GI: soft, nontender, nondistended, + BS MS: no deformity or atrophy  Skin: warm and dry, no rash Neuro:  Strength and sensation are intact Psych: euthymic mood, full affect    EKG:  EKG is  ordered today. EKG showed normal sinus rhythm with nonspecific T wave changes.  Moderate LVH.    Recent Labs: No results found for requested labs within last 8760 hours.    Lipid Panel    Component Value Date/Time   CHOL 168 06/01/2016 0333   TRIG 145 06/01/2016 0333   HDL 55 06/01/2016 0333   CHOLHDL 3.1 06/01/2016 0333   VLDL 29 06/01/2016 0333   LDLCALC 84 06/01/2016 0333      Wt Readings from Last 3 Encounters:  09/04/21 204 lb (92.5 kg)  05/22/21 193 lb (87.5 kg)  01/04/20 179 lb 12.8 oz (81.6 kg)       No flowsheet data found.    ASSESSMENT AND PLAN:  1.  Coronary artery disease involving native coronary arteries without angina: Recent worsening of dyspnea likely due to recent viral illness.  No evidence of volume overload.    Recommend continuing medical therapy and low-dose aspirin.   2.    Aortic stenosis: This was mild on most recent echocardiogram done in August.   3. Essential hypertension: Her blood pressure has been very elevated recently especially after switching amlodipine to diltiazem.  I elected to add losartan 50 mg daily and check basic metabolic profile in 1 week.  4. Hyperlipidemia: She was recently switched from simvastatin to rosuvastatin.  5.  Palpitations: She is in sinus rhythm today but there has been  concerns about atrial fibrillation.  I requested a 2-week Zio monitor.    Disposition: Will obtain her records from [facility], place a 2-week Zio monitor and follow-up in 2 months.  Signed,  Kathlyn Sacramento, MD  09/04/2021 4:36 PM    [facility]

## 2021-09-04 NOTE — Patient Instructions (Signed)
Medication Instructions:  START Losartan 50 mg once daily  *If you need a refill on your cardiac medications before your next appointment, please call your pharmacy*   Lab Work: Your provider would like for you to return in one week to have the following labs drawn: BMET. You do not need an appointment for the lab. Once in our office lobby there is a podium where you can sign in and ring the doorbell to alert [place] that you are here. The lab is open from 8:00 am to 4:30 pm; closed for lunch from 12:45pm-1:45pm.  If you have labs (blood work) drawn today and your tests are completely normal, you will receive your results only by: [place] (if you have MyChart) OR A paper copy in the mail If you have any lab test that is abnormal or we need to change your treatment, we will call you to review the results.   Testing/Procedures: [place] [place]- Long Term Monitor Instructions  Your physician has requested you wear a ZIO patch monitor for 14 days.  This is a single patch monitor. [employer] supplies one patch monitor per enrollment. Additional stickers are not available. Please do not apply patch if you will be having a Nuclear Stress Test,  Echocardiogram, Cardiac CT, MRI, or Chest Xray during the period you would be wearing the  monitor. The patch cannot be worn during these tests. You cannot remove and re-apply the  ZIO XT patch monitor.  Your ZIO patch monitor will be mailed 3 day USPS to your address on file. It may take 3-5 days  to receive your monitor after you have been enrolled.  Once you have received your monitor, please review the enclosed instructions. Your monitor  has already been registered assigning a specific monitor serial # to you.  Billing and Patient Assistance Program Information  We have supplied [employer] with any of your insurance information on file for billing purposes. [employer] offers a sliding scale Patient Assistance Program for patients that do not have  insurance,  or whose insurance does not completely cover the cost of the ZIO monitor.  You must apply for the Patient Assistance Program to qualify for this discounted rate.  To apply, please call [employer] at [phone], select option 4, select option 2, ask to apply for  Patient Assistance Program. Theodore Demark will ask your household income, and how many people  are in your household. They will quote your out-of-pocket cost based on that information.  [employer] will also be able to set up a 75-month, interest-free payment plan if needed.  Applying the monitor   Shave hair from upper left chest.  Hold abrader disc by orange tab. Rub abrader in 40 strokes over the upper left chest as  indicated in your monitor instructions.  Clean area with 4 enclosed alcohol pads. Let dry.  Apply patch as indicated in monitor instructions. Patch will be placed under collarbone on left  side of chest with arrow pointing upward.  Rub patch adhesive wings for 2 minutes. Remove white label marked "1". Remove the white  label marked "2". Rub patch adhesive wings for 2 additional minutes.  While looking in a mirror, press and release button in center of patch. A small green light will  flash 3-4 times. This will be your only indicator that the monitor has been turned on.  Do not shower for the first 24 hours. You may shower after the first 24 hours.  Press the button if you feel a symptom. You  will hear a small click. Record Date, Time and  Symptom in the Patient Logbook.  When you are ready to remove the patch, follow instructions on the last 2 pages of Patient  Logbook. Stick patch monitor onto the last page of Patient Logbook.  Place Patient Logbook in the blue and white box. Use locking tab on box and tape box closed  securely. The blue and white box has prepaid postage on it. Please place it in the mailbox as  soon as possible. Your physician should have your test results approximately 7 days after the  monitor has been  mailed back to [facility].  Call [facility] at [phone] if you have questions regarding  your ZIO XT patch monitor. Call them immediately if you see an orange light blinking on your  monitor.  If your monitor falls off in less than 4 days, contact our Monitor department at [phone].  If your monitor becomes loose or falls off after 4 days call [employer] at [phone] for  suggestions on securing your monitor    Follow-Up: At [facility], you and your health needs are our priority.  As part of our continuing mission to provide you with exceptional heart care, we have created designated Provider Care Teams.  These Care Teams include your primary Cardiologist (physician) and Advanced Practice Providers (APPs -  Physician Assistants and Nurse Practitioners) who all work together to provide you with the care you need, when you need it.  We recommend signing up for the patient portal called "MyChart".  Sign up information is provided on this After Visit Summary.  MyChart is used to connect with patients for Virtual Visits (Telemedicine).  Patients are able to view lab/test results, encounter notes, upcoming appointments, etc.  Non-urgent messages can be sent to your provider as well.   To learn more about what you can do with MyChart, go to [email or web address].    Your next appointment:   2 month(s)  The format for your next appointment:   In Person  Provider:   Kathlyn Sacramento, MD

## 2021-09-04 NOTE — Progress Notes (Unsigned)
Enrolled patient for a 14 day Zio XT  monitor to be mailed to patients home  °

## 2021-09-06 DIAGNOSIS — Z20822 Contact with and (suspected) exposure to covid-19: Secondary | ICD-10-CM | POA: Diagnosis not present

## 2021-09-06 DIAGNOSIS — J020 Streptococcal pharyngitis: Secondary | ICD-10-CM | POA: Diagnosis not present

## 2021-09-06 DIAGNOSIS — J029 Acute pharyngitis, unspecified: Secondary | ICD-10-CM | POA: Diagnosis not present

## 2021-09-06 DIAGNOSIS — Z6833 Body mass index (BMI) 33.0-33.9, adult: Secondary | ICD-10-CM | POA: Diagnosis not present

## 2021-09-07 DIAGNOSIS — R002 Palpitations: Secondary | ICD-10-CM | POA: Diagnosis not present

## 2021-09-13 DIAGNOSIS — J449 Chronic obstructive pulmonary disease, unspecified: Secondary | ICD-10-CM | POA: Diagnosis not present

## 2021-09-13 DIAGNOSIS — Z1152 Encounter for screening for COVID-19: Secondary | ICD-10-CM | POA: Diagnosis not present

## 2021-09-13 DIAGNOSIS — J984 Other disorders of lung: Secondary | ICD-10-CM | POA: Diagnosis not present

## 2021-09-13 DIAGNOSIS — J020 Streptococcal pharyngitis: Secondary | ICD-10-CM | POA: Diagnosis not present

## 2021-09-13 DIAGNOSIS — Z20822 Contact with and (suspected) exposure to covid-19: Secondary | ICD-10-CM | POA: Diagnosis not present

## 2021-09-21 DIAGNOSIS — I10 Essential (primary) hypertension: Secondary | ICD-10-CM | POA: Diagnosis not present

## 2021-09-23 DIAGNOSIS — F339 Major depressive disorder, recurrent, unspecified: Secondary | ICD-10-CM | POA: Diagnosis not present

## 2021-09-23 DIAGNOSIS — I25119 Atherosclerotic heart disease of native coronary artery with unspecified angina pectoris: Secondary | ICD-10-CM | POA: Diagnosis not present

## 2021-09-23 DIAGNOSIS — I10 Essential (primary) hypertension: Secondary | ICD-10-CM | POA: Diagnosis not present

## 2021-09-23 DIAGNOSIS — E785 Hyperlipidemia, unspecified: Secondary | ICD-10-CM | POA: Diagnosis not present

## 2021-09-25 ENCOUNTER — Telehealth: Payer: Self-pay | Admitting: Cardiovascular Disease

## 2021-09-25 NOTE — Telephone Encounter (Signed)
Janice Glass is calling stating she forgot to get the labs Dr. Fletcher Anon was requesting at her last appt. She is wanting to know if she should come in and have them performed.

## 2021-09-25 NOTE — Telephone Encounter (Signed)
Spoke to patient she said she forgot to have bmet done in Dec.Advised she can done this week.I will make Dr.Arida's RN aware.

## 2021-09-27 ENCOUNTER — Telehealth: Payer: Self-pay | Admitting: *Deleted

## 2021-09-27 DIAGNOSIS — R002 Palpitations: Secondary | ICD-10-CM | POA: Diagnosis not present

## 2021-09-27 MED ORDER — DILTIAZEM HCL ER COATED BEADS 240 MG PO CP24: 240.0000 mg | ORAL_CAPSULE | Freq: Every day | ORAL | 3 refills | Status: AC

## 2021-09-27 NOTE — Telephone Encounter (Signed)
Spoke with pt, aware of monitor results and medication change.  New script sent to the pharmacy

## 2021-09-27 NOTE — Telephone Encounter (Signed)
-----   Message from Wellington Hampshire, MD sent at 09/27/2021 12:51 PM EST ----- Inform patient that monitor showed frequent short runs of supraventricular tachycardia.  No evidence of atrial fibrillation.  Recommend increasing the dose of diltiazem extended release to 240 mg once daily.

## 2021-09-28 DIAGNOSIS — J020 Streptococcal pharyngitis: Secondary | ICD-10-CM | POA: Diagnosis not present

## 2021-09-28 DIAGNOSIS — U071 COVID-19: Secondary | ICD-10-CM | POA: Diagnosis not present

## 2021-09-28 DIAGNOSIS — R059 Cough, unspecified: Secondary | ICD-10-CM | POA: Diagnosis not present

## 2021-09-28 DIAGNOSIS — Z20822 Contact with and (suspected) exposure to covid-19: Secondary | ICD-10-CM | POA: Diagnosis not present

## 2021-10-15 DIAGNOSIS — I2510 Atherosclerotic heart disease of native coronary artery without angina pectoris: Secondary | ICD-10-CM | POA: Diagnosis not present

## 2021-10-15 DIAGNOSIS — J984 Other disorders of lung: Secondary | ICD-10-CM | POA: Diagnosis not present

## 2021-10-15 DIAGNOSIS — J929 Pleural plaque without asbestos: Secondary | ICD-10-CM | POA: Diagnosis not present

## 2021-10-15 DIAGNOSIS — F339 Major depressive disorder, recurrent, unspecified: Secondary | ICD-10-CM | POA: Diagnosis not present

## 2021-10-15 DIAGNOSIS — J449 Chronic obstructive pulmonary disease, unspecified: Secondary | ICD-10-CM | POA: Diagnosis not present

## 2021-10-15 DIAGNOSIS — I25119 Atherosclerotic heart disease of native coronary artery with unspecified angina pectoris: Secondary | ICD-10-CM | POA: Diagnosis not present

## 2021-10-15 DIAGNOSIS — K449 Diaphragmatic hernia without obstruction or gangrene: Secondary | ICD-10-CM | POA: Diagnosis not present

## 2021-10-15 DIAGNOSIS — Z6833 Body mass index (BMI) 33.0-33.9, adult: Secondary | ICD-10-CM | POA: Diagnosis not present

## 2021-10-15 DIAGNOSIS — I517 Cardiomegaly: Secondary | ICD-10-CM | POA: Diagnosis not present

## 2021-10-21 DIAGNOSIS — I10 Essential (primary) hypertension: Secondary | ICD-10-CM | POA: Diagnosis not present

## 2021-10-24 DIAGNOSIS — E785 Hyperlipidemia, unspecified: Secondary | ICD-10-CM | POA: Diagnosis not present

## 2021-10-24 DIAGNOSIS — I10 Essential (primary) hypertension: Secondary | ICD-10-CM | POA: Diagnosis not present

## 2021-10-24 DIAGNOSIS — I25119 Atherosclerotic heart disease of native coronary artery with unspecified angina pectoris: Secondary | ICD-10-CM | POA: Diagnosis not present

## 2021-10-24 DIAGNOSIS — F339 Major depressive disorder, recurrent, unspecified: Secondary | ICD-10-CM | POA: Diagnosis not present

## 2021-10-25 ENCOUNTER — Telehealth: Payer: Self-pay | Admitting: Cardiovascular Disease

## 2021-10-25 DIAGNOSIS — I509 Heart failure, unspecified: Secondary | ICD-10-CM | POA: Diagnosis not present

## 2021-10-25 DIAGNOSIS — J180 Bronchopneumonia, unspecified organism: Secondary | ICD-10-CM | POA: Diagnosis not present

## 2021-10-25 DIAGNOSIS — D692 Other nonthrombocytopenic purpura: Secondary | ICD-10-CM | POA: Diagnosis not present

## 2021-10-25 DIAGNOSIS — Z6833 Body mass index (BMI) 33.0-33.9, adult: Secondary | ICD-10-CM | POA: Diagnosis not present

## 2021-10-25 NOTE — Telephone Encounter (Signed)
Pt c/o medication issue:  1. Name of Medication: diltiazem (CARTIA XT) 240 MG 24 hr capsule  2. How are you currently taking this medication (dosage and times per day)?   3. Are you having a reaction (difficulty breathing--STAT)? no  4. What is your medication issue? Patient wanted to know if she should take the 240 mg dose that Dr. Fletcher Anon prescribed or if she should take the 180 mg dose that was prescribed in the hospital

## 2021-10-25 NOTE — Telephone Encounter (Signed)
Spoke with pt, aware per monitor results, she should take 240 mg of diltiazem. She reports she has been taking both 180 mg and the 240 mg tablets. Patient made aware to stop the 180 mg and only take 240 mg once daily.

## 2021-11-01 ENCOUNTER — Telehealth: Payer: Self-pay | Admitting: Cardiovascular Disease

## 2021-11-01 NOTE — Telephone Encounter (Signed)
Calling to get a copy of office visit on 09/04/21 and well as he his results from his test. Fax num [phone]. Please advise

## 2021-11-01 NOTE — Telephone Encounter (Signed)
Spoke with Delsa Sale at [facility] who requested last office visit and echo results be faxed over. Done.

## 2021-11-05 DIAGNOSIS — I5030 Unspecified diastolic (congestive) heart failure: Secondary | ICD-10-CM | POA: Diagnosis not present

## 2021-11-05 DIAGNOSIS — N1831 Chronic kidney disease, stage 3a: Secondary | ICD-10-CM | POA: Diagnosis not present

## 2021-11-05 DIAGNOSIS — I10 Essential (primary) hypertension: Secondary | ICD-10-CM | POA: Diagnosis not present

## 2021-11-05 DIAGNOSIS — Z6833 Body mass index (BMI) 33.0-33.9, adult: Secondary | ICD-10-CM | POA: Diagnosis not present

## 2021-11-05 DIAGNOSIS — R002 Palpitations: Secondary | ICD-10-CM | POA: Diagnosis not present

## 2021-11-06 ENCOUNTER — Other Ambulatory Visit: Payer: Self-pay

## 2021-11-06 ENCOUNTER — Telehealth: Payer: Self-pay | Admitting: Cardiovascular Disease

## 2021-11-06 ENCOUNTER — Encounter: Payer: Self-pay | Admitting: Cardiovascular Disease

## 2021-11-06 ENCOUNTER — Ambulatory Visit (INDEPENDENT_AMBULATORY_CARE_PROVIDER_SITE_OTHER): Payer: Medicare HMO | Admitting: Cardiovascular Disease

## 2021-11-06 VITALS — BP 144/78 | HR 82 | Ht 64.0 in | Wt 200.0 lb

## 2021-11-06 DIAGNOSIS — I5031 Acute diastolic (congestive) heart failure: Secondary | ICD-10-CM

## 2021-11-06 DIAGNOSIS — I350 Nonrheumatic aortic (valve) stenosis: Secondary | ICD-10-CM

## 2021-11-06 DIAGNOSIS — I471 Supraventricular tachycardia: Secondary | ICD-10-CM | POA: Diagnosis not present

## 2021-11-06 DIAGNOSIS — R002 Palpitations: Secondary | ICD-10-CM | POA: Diagnosis not present

## 2021-11-06 DIAGNOSIS — I2510 Atherosclerotic heart disease of native coronary artery without angina pectoris: Secondary | ICD-10-CM

## 2021-11-06 DIAGNOSIS — I10 Essential (primary) hypertension: Secondary | ICD-10-CM

## 2021-11-06 DIAGNOSIS — E785 Hyperlipidemia, unspecified: Secondary | ICD-10-CM | POA: Diagnosis not present

## 2021-11-06 LAB — BASIC METABOLIC PANEL
BUN/Creatinine Ratio: 11 — ABNORMAL LOW (ref 12–28)
BUN: 12 mg/dL (ref 8–27)
CO2: 24 mmol/L (ref 20–29)
Calcium: 9.1 mg/dL (ref 8.7–10.3)
Chloride: 98 mmol/L (ref 96–106)
Creatinine, Ser: 1.05 mg/dL — ABNORMAL HIGH (ref 0.57–1.00)
Glucose: 111 mg/dL — ABNORMAL HIGH (ref 70–99)
Potassium: 3.5 mmol/L (ref 3.5–5.2)
Sodium: 137 mmol/L (ref 134–144)
eGFR: 54 mL/min/{1.73_m2} — ABNORMAL LOW (ref >59)

## 2021-11-06 NOTE — Telephone Encounter (Signed)
lab work done yesterday and patient is in heart failure... Dr. Marco Collie would like to speak w/ Dr. Fletcher Anon.. please contact @[phone].

## 2021-11-06 NOTE — Progress Notes (Signed)
Cardiology Office Note   Date:  11/06/2021   ID:  Janice Glass, DOB Mar 05, 1941, MRN [id number]  PCP:  Marco Collie, MD  Cardiologist:   Kathlyn Sacramento, MD   Chief Complaint  Patient presents with   Follow-up    2 months.   Shortness of Breath       History of Present Illness: Janice Glass is a 81 y.o. female who presents for a followup visit regarding coronary artery disease and aortic stenosis.   She has known history of coronary artery disease with previous drug-eluting stent placement to the LAD.  Cardiac catheterization in September 2017 showed patent LAD stent with moderate mid LAD stenosis beyond the stented segment.   She has worsening dyspnea in 2020 and thus a right and left cardiac catheterization was done in September 2020 which showed widely patent proximal LAD stent with no significant restenosis.  There was stable 50% stenosis in the distal LAD and 50% stenosis in the proximal right coronary artery with significant ostial stenosis in a small second diagonal.  Right heart catheterization was normal.  There was 15 mm peak to peak gradient across the aortic valve consistent with mild aortic stenosis. No revascularization was required.  Echocardiogram in August 2022 showed normal LV systolic function, grade 1 diastolic dysfunction and stable mild aortic stenosis with mean gradient of 11 mmHg.  She reports having a viral illness in November and was also diagnosed with strep throat.  She had some worsening shortness of breath with that but then she developed palpitations and worsening dyspnea and was hospitalized at [facility].  She was initially thought of having atrial fibrillation but it was sinus tachycardia with PACs. She was switched from amlodipine to diltiazem.  I proceeded with 2-week ZIO monitor which showed sinus rhythm with short runs of SVT without atrial fibrillation.  I increase the dose of diltiazem to 240 mg once daily. She was seen recently  by Dr. Nyra Capes with increased shortness of breath and leg edema.  RAD vest showed evidence of volume overload.  She was started on small dose furosemide 20 mg daily but had no significant improvement and thus the dose was increased today to 40 mg daily.  Her labs showed elevated proBNP at 1700.  The patient denies chest pain.  She feels that she became short of breath after her viral illness late last year.   Past Medical History:  Diagnosis Date   Anxiety    CAD (coronary artery disease)    a. 11/2009 Abnl myoview - inferobasal ischemia;  b. 12/2009 PCI/DES to pLAD w/ 3x15 mm Promus DES;  c. Relook Cath 01/08/2010: patent stent, nonobs dzs. Relook cath 02/2012: patent LAD stent with mild disease.   Depression    GERD (gastroesophageal reflux disease)    HLD (hyperlipidemia)    Hypertensive heart disease    Hypothyroidism    Mild Carotid Arterial Disease    a. 04/2015 Carotid U/S: 1-39% bilat ICA stenosis.   Mild mitral regurgitation    a. 08/2015 Echo: Ef 55-60%, no rwma, mild MR.    Past Surgical History:  Procedure Laterality Date   CARDIAC CATHETERIZATION  12/2009   CARDIAC CATHETERIZATION N/A 06/03/2016   Procedure: Left Heart Cath and Coronary Angiography;  Surgeon: Lorretta Harp, MD;  Location: [place] [facility];  Service: Cardiovascular;  Laterality: N/A;   CESAREAN SECTION     several    CORONARY ANGIOPLASTY WITH STENT PLACEMENT  12/2009   DES to  proximal LAD   CORONARY STENT PLACEMENT     LEFT HEART CATHETERIZATION WITH CORONARY ANGIOGRAM N/A 03/20/2012   Procedure: LEFT HEART CATHETERIZATION WITH CORONARY ANGIOGRAM;  Surgeon: Hillary Bow, MD;  Location: [facility] CATH LAB;  Service: Cardiovascular;  Laterality: N/A;   RIGHT/LEFT HEART CATH AND CORONARY ANGIOGRAPHY N/A 06/02/2019   Procedure: RIGHT/LEFT HEART CATH AND CORONARY ANGIOGRAPHY;  Surgeon: Wellington Hampshire, MD;  Location: [place] [facility];  Service: Cardiovascular;  Laterality: N/A;     Current Outpatient  Medications  Medication Sig Dispense Refill   acetaminophen (TYLENOL) 500 MG tablet Take 500-1,000 mg by mouth every 8 (eight) hours as needed for mild pain or headache.     anti-nausea (EMETROL) solution Take 10 mLs by mouth every 15 (fifteen) minutes as needed for nausea or vomiting.     aspirin 81 MG EC tablet Take 1 tablet (81 mg total) by mouth daily.     diltiazem (CARTIA XT) 240 MG 24 hr capsule Take 1 capsule (240 mg total) by mouth daily. 90 capsule 3   fluticasone (FLONASE) 50 MCG/ACT nasal spray Place 2 sprays into both nostrils 2 (two) times daily.      furosemide (LASIX) 20 MG tablet Take 40 mg by mouth daily.     levothyroxine (SYNTHROID) 88 MCG tablet Take 88 mcg by mouth daily before breakfast.     loratadine (CLARITIN) 10 MG tablet Take 10 mg by mouth daily.     losartan (COZAAR) 50 MG tablet Take 1 tablet (50 mg total) by mouth daily. 90 tablet 3   metoprolol tartrate (LOPRESSOR) 50 MG tablet Take 1 tablet (50 mg total) by mouth 2 (two) times daily. 60 tablet 6   Multiple Vitamins-Minerals (EYE VITAMINS) CAPS Take 1 capsule by mouth daily.     nitroGLYCERIN (NITROSTAT) 0.4 MG SL tablet Place 1 tablet (0.4 mg total) under the tongue every 5 (five) minutes as needed. For chest pain 25 tablet 3   omeprazole (PRILOSEC) 40 MG capsule Take 40 mg by mouth 2 (two) times daily.      ondansetron (ZOFRAN) 4 MG tablet Take 4 mg by mouth every 8 (eight) hours as needed for nausea or vomiting.      potassium chloride (K-DUR) 10 MEQ tablet Take 10 mEq by mouth every evening.      promethazine (PHENERGAN) 25 MG tablet Take 1 tablet (25 mg total) by mouth every 6 (six) hours as needed for nausea or vomiting. 20 tablet 0   rosuvastatin (CRESTOR) 20 MG tablet Take 20 mg by mouth 2 (two) times a week.     traMADol (ULTRAM) 50 MG tablet Take 50 mg by mouth every 12 (twelve) hours as needed (pain).      Vitamin D, Ergocalciferol, (DRISDOL) 1.25 MG (50000 UT) CAPS capsule Take 50,000 Units by mouth  every Thursday.     No current facility-administered medications for this visit.    Allergies:   Ciprofloxacin, Codeine, Gabapentin, Sulfa antibiotics, Sulfonamide derivatives, Wellbutrin [bupropion], and Zolpidem    Social History:  The patient  reports that she has never smoked. She has never used smokeless tobacco. She reports that she does not drink alcohol and does not use drugs.   Family History:  The patient's family history includes Heart failure in her mother.    ROS:  Please see the history of present illness.   Otherwise, review of systems are positive for none.   All other systems are reviewed and negative.    PHYSICAL EXAM:  VS:  BP (!) 144/78 (BP Location: Left Arm, Patient Position: Sitting, Cuff Size: Normal)    Pulse 82    Ht 5\' 4"  (1.626 m)    Wt 200 lb (90.7 kg)    BMI 34.33 kg/m  , BMI Body mass index is 34.33 kg/m. GEN: Well nourished, well developed, in no acute distress  HEENT: normal  Neck: no JVD, carotid bruits, or masses Cardiac: RRR; no rubs, or gallops,. 2/6 systolic ejection murmur in the aortic area.  Mild bilateral leg edema Respiratory:  clear to auscultation bilaterally, normal work of breathing GI: soft, nontender, nondistended, + BS MS: no deformity or atrophy  Skin: warm and dry, no rash Neuro:  Strength and sensation are intact Psych: euthymic mood, full affect    EKG:  EKG is not ordered today.     Recent Labs: 11/05/2021: BUN 12; Creatinine, Ser 1.05; Potassium 3.5; Sodium 137    Lipid Panel    Component Value Date/Time   CHOL 168 06/01/2016 0333   TRIG 145 06/01/2016 0333   HDL 55 06/01/2016 0333   CHOLHDL 3.1 06/01/2016 0333   VLDL 29 06/01/2016 0333   LDLCALC 84 06/01/2016 0333      Wt Readings from Last 3 Encounters:  11/06/21 200 lb (90.7 kg)  09/04/21 204 lb (92.5 kg)  05/22/21 193 lb (87.5 kg)       No flowsheet data found.    ASSESSMENT AND PLAN:  1.  Acute diastolic heart failure: Recent symptoms of  increased shortness of breath, leg edema and increased weight.  I agree with increasing the dose of furosemide to 40 mg once daily.  I requested an echocardiogram to see why she is having heart failure symptoms.  2. Coronary artery disease involving native coronary arteries without angina: No chest pain but has increased heart failure symptoms.   2.  Aortic stenosis: This was mild on most recent echocardiogram in 2022 and will be checked with upcoming echocardiogram.   3. Essential hypertension: Blood pressure improved with addition of losartan.  4. Hyperlipidemia: She was recently switched from simvastatin to rosuvastatin.  5.  SVT and PACs: Symptoms improved with increasing the dose of diltiazem.  She is also on metoprolol.    Disposition: Obtain an echocardiogram and follow-up in 3 months.  Signed,  Kathlyn Sacramento, MD  11/06/2021 11:39 AM    Page

## 2021-11-06 NOTE — Telephone Encounter (Signed)
Noted and taken care of-thank you

## 2021-11-06 NOTE — Telephone Encounter (Signed)
°  °  lab work done yesterday and patient is in heart failure... Dr. Marco Collie would like to speak w/ Dr. Fletcher Anon.. please contact [phone].

## 2021-11-06 NOTE — Patient Instructions (Signed)
Medication Instructions:  No changes *If you need a refill on your cardiac medications before your next appointment, please call your pharmacy*   Lab Work: None ordered If you have labs (blood work) drawn today and your tests are completely normal, you will receive your results only by: [place] (if you have MyChart) OR A paper copy in the mail If you have any lab test that is abnormal or we need to change your treatment, we will call you to review the results.   Testing/Procedures: Your physician has requested that you have an echocardiogram. Echocardiography is a painless test that uses sound waves to create images of your heart. It provides your doctor with information about the size and shape of your heart and how well your hearts chambers and valves are working. You may receive an ultrasound enhancing agent through an IV if needed to better visualize your heart during the echo.This procedure takes approximately one hour. There are no restrictions for this procedure. This will take place at the [address].     Follow-Up: At [facility], you and your health needs are our priority.  As part of our continuing mission to provide you with exceptional heart care, we have created designated Provider Care Teams.  These Care Teams include your primary Cardiologist (physician) and Advanced Practice Providers (APPs -  Physician Assistants and Nurse Practitioners) who all work together to provide you with the care you need, when you need it.  We recommend signing up for the patient portal called "MyChart".  Sign up information is provided on this After Visit Summary.  MyChart is used to connect with patients for Virtual Visits (Telemedicine).  Patients are able to view lab/test results, encounter notes, upcoming appointments, etc.  Non-urgent messages can be sent to your provider as well.   To learn more about what you can do with MyChart, go to [email or web address].     Your next appointment:   3 month(s)  The format for your next appointment:   In Person  Provider:   Kathlyn Sacramento, MD     Other Instructions Heart-Healthy Eating Plan Many factors influence your heart (coronary) health, including eating and exercise habits. Coronary risk increases with abnormal blood fat (lipid) levels. Heart-healthy meal planning includes limiting unhealthy fats, increasing healthy fats, and making other diet and lifestyle changes. What is my plan? Your health care provider may recommend that you: Limit your fat intake to _________% or less of your total calories each day. Limit your saturated fat intake to _________% or less of your total calories each day. Limit the amount of cholesterol in your diet to less than _________ mg per day. What are tips for following this plan? Cooking Cook foods using methods other than frying. Baking, boiling, grilling, and broiling are all good options. Other ways to reduce fat include: Removing the skin from poultry. Removing all visible fats from meats. Steaming vegetables in water or broth. Meal planning  At meals, imagine dividing your plate into fourths: Fill one-half of your plate with vegetables and green salads. Fill one-fourth of your plate with whole grains. Fill one-fourth of your plate with lean protein foods. Eat 4-5 servings of vegetables per day. One serving equals 1 cup raw or cooked vegetable, or 2 cups raw leafy greens. Eat 4-5 servings of fruit per day. One serving equals 1 medium whole fruit,  cup dried fruit,  cup fresh, frozen, or canned fruit, or  cup 100% fruit juice. Eat more  foods that contain soluble fiber. Examples include apples, broccoli, carrots, beans, peas, and barley. Aim to get 25-30 g of fiber per day. Increase your consumption of legumes, nuts, and seeds to 4-5 servings per week. One serving of dried beans or legumes equals  cup cooked, 1 serving of nuts is  cup, and 1 serving of seeds  equals 1 tablespoon. Fats Choose healthy fats more often. Choose monounsaturated and polyunsaturated fats, such as olive and canola oils, flaxseeds, walnuts, almonds, and seeds. Eat more omega-3 fats. Choose salmon, mackerel, sardines, tuna, flaxseed oil, and ground flaxseeds. Aim to eat fish at least 2 times each week. Check food labels carefully to identify foods with trans fats or high amounts of saturated fat. Limit saturated fats. These are found in animal products, such as meats, butter, and cream. Plant sources of saturated fats include palm oil, palm kernel oil, and coconut oil. Avoid foods with partially hydrogenated oils in them. These contain trans fats. Examples are stick margarine, some tub margarines, cookies, crackers, and other baked goods. Avoid fried foods. General information Eat more home-cooked food and less restaurant, buffet, and fast food. Limit or avoid alcohol. Limit foods that are high in starch and sugar. Lose weight if you are overweight. Losing just 5-10% of your body weight can help your overall health and prevent diseases such as diabetes and heart disease. Monitor your salt (sodium) intake, especially if you have high blood pressure. Talk with your health care provider about your sodium intake. Try to incorporate more vegetarian meals weekly. What foods can I eat? Fruits All fresh, canned (in natural juice), or frozen fruits. Vegetables Fresh or frozen vegetables (raw, steamed, roasted, or grilled). Green salads. Grains Most grains. Choose whole wheat and whole grains most of the time. Rice and pasta, including brown rice and pastas made with whole wheat. Meats and other proteins Lean, well-trimmed beef, veal, pork, and lamb. Chicken and [place] without skin. All fish and shellfish. Wild duck, rabbit, pheasant, and venison. Egg whites or low-cholesterol egg substitutes. Dried beans, peas, lentils, and tofu. Seeds and most nuts. Dairy Low-fat or nonfat cheeses,  including ricotta and mozzarella. Skim or 1% milk (liquid, powdered, or evaporated). Buttermilk made with low-fat milk. Nonfat or low-fat yogurt. Fats and oils Non-hydrogenated (trans-free) margarines. Vegetable oils, including soybean, sesame, sunflower, olive, peanut, safflower, corn, canola, and cottonseed. Salad dressings or mayonnaise made with a vegetable oil. Beverages Water (mineral or sparkling). Coffee and tea. Diet carbonated beverages. Sweets and desserts Sherbet, gelatin, and fruit ice. Small amounts of dark chocolate. Limit all sweets and desserts. Seasonings and condiments All seasonings and condiments. The items listed above may not be a complete list of foods and beverages you can eat. Contact a dietitian for more options. What foods are not recommended? Fruits Canned fruit in heavy syrup. Fruit in cream or butter sauce. Fried fruit. Limit coconut. Vegetables Vegetables cooked in cheese, cream, or butter sauce. Fried vegetables. Grains Breads made with saturated or trans fats, oils, or whole milk. Croissants. Sweet rolls. Donuts. High-fat crackers, such as cheese crackers. Meats and other proteins Fatty meats, such as hot dogs, ribs, sausage, bacon, rib-eye roast or steak. High-fat deli meats, such as salami and bologna. Caviar. Domestic duck and goose. Organ meats, such as liver. Dairy Cream, sour cream, cream cheese, and creamed cottage cheese. Whole-milk cheeses. Whole or 2% milk (liquid, evaporated, or condensed). Whole buttermilk. Cream sauce or high-fat cheese sauce. Whole-milk yogurt. Fats and oils Meat fat, or shortening. Cocoa butter,  hydrogenated oils, palm oil, coconut oil, palm kernel oil. Solid fats and shortenings, including bacon fat, salt pork, lard, and butter. Nondairy cream substitutes. Salad dressings with cheese or sour cream. Beverages Regular sodas and any drinks with added sugar. Sweets and desserts Frosting. Pudding. Cookies. Cakes. Pies. Milk  chocolate or white chocolate. Buttered syrups. Full-fat ice cream or ice cream drinks. The items listed above may not be a complete list of foods and beverages to avoid. Contact a dietitian for more information. Summary Heart-healthy meal planning includes limiting unhealthy fats, increasing healthy fats, and making other diet and lifestyle changes. Lose weight if you are overweight. Losing just 5-10% of your body weight can help your overall health and prevent diseases such as diabetes and heart disease. Focus on eating a balance of foods, including fruits and vegetables, low-fat or nonfat dairy, lean protein, nuts and legumes, whole grains, and heart-healthy oils and fats. This information is not intended to replace advice given to you by your health care provider. Make sure you discuss any questions you have with your health care provider. Document Revised: 01/18/2021 Document Reviewed: 01/18/2021 Elsevier Patient Education  2022 [employer].

## 2021-11-13 DIAGNOSIS — Z6833 Body mass index (BMI) 33.0-33.9, adult: Secondary | ICD-10-CM | POA: Diagnosis not present

## 2021-11-13 DIAGNOSIS — I5030 Unspecified diastolic (congestive) heart failure: Secondary | ICD-10-CM | POA: Diagnosis not present

## 2021-11-16 ENCOUNTER — Other Ambulatory Visit: Payer: Self-pay

## 2021-11-16 ENCOUNTER — Ambulatory Visit (HOSPITAL_COMMUNITY): Payer: Medicare HMO | Attending: Cardiology

## 2021-11-16 DIAGNOSIS — I5031 Acute diastolic (congestive) heart failure: Secondary | ICD-10-CM | POA: Insufficient documentation

## 2021-11-16 LAB — ECHOCARDIOGRAM COMPLETE
AR max vel: 1.65 cm2
AV Area VTI: 1.79 cm2
AV Area mean vel: 1.7 cm2
AV Mean grad: 17 mmHg
AV Peak grad: 28.1 mmHg
Ao pk vel: 2.65 m/s
Area-P 1/2: 2.37 cm2
S' Lateral: 2.2 cm

## 2021-11-21 DIAGNOSIS — E785 Hyperlipidemia, unspecified: Secondary | ICD-10-CM | POA: Diagnosis not present

## 2021-11-21 DIAGNOSIS — I10 Essential (primary) hypertension: Secondary | ICD-10-CM | POA: Diagnosis not present

## 2021-11-21 DIAGNOSIS — F339 Major depressive disorder, recurrent, unspecified: Secondary | ICD-10-CM | POA: Diagnosis not present

## 2021-11-21 DIAGNOSIS — I25119 Atherosclerotic heart disease of native coronary artery with unspecified angina pectoris: Secondary | ICD-10-CM | POA: Diagnosis not present

## 2021-11-22 DIAGNOSIS — I10 Essential (primary) hypertension: Secondary | ICD-10-CM | POA: Diagnosis not present

## 2021-12-11 DIAGNOSIS — E785 Hyperlipidemia, unspecified: Secondary | ICD-10-CM | POA: Diagnosis not present

## 2021-12-11 DIAGNOSIS — I10 Essential (primary) hypertension: Secondary | ICD-10-CM | POA: Diagnosis not present

## 2021-12-11 DIAGNOSIS — I5030 Unspecified diastolic (congestive) heart failure: Secondary | ICD-10-CM | POA: Diagnosis not present

## 2021-12-11 DIAGNOSIS — I25119 Atherosclerotic heart disease of native coronary artery with unspecified angina pectoris: Secondary | ICD-10-CM | POA: Diagnosis not present

## 2021-12-21 DIAGNOSIS — I10 Essential (primary) hypertension: Secondary | ICD-10-CM | POA: Diagnosis not present

## 2021-12-21 DIAGNOSIS — E669 Obesity, unspecified: Secondary | ICD-10-CM | POA: Diagnosis not present

## 2021-12-22 DIAGNOSIS — I10 Essential (primary) hypertension: Secondary | ICD-10-CM | POA: Diagnosis not present

## 2021-12-22 DIAGNOSIS — M159 Polyosteoarthritis, unspecified: Secondary | ICD-10-CM | POA: Diagnosis not present

## 2021-12-31 DIAGNOSIS — I10 Essential (primary) hypertension: Secondary | ICD-10-CM | POA: Diagnosis not present

## 2021-12-31 DIAGNOSIS — E785 Hyperlipidemia, unspecified: Secondary | ICD-10-CM | POA: Diagnosis not present

## 2021-12-31 DIAGNOSIS — E039 Hypothyroidism, unspecified: Secondary | ICD-10-CM | POA: Diagnosis not present

## 2022-01-17 DIAGNOSIS — I5030 Unspecified diastolic (congestive) heart failure: Secondary | ICD-10-CM | POA: Diagnosis not present

## 2022-01-17 DIAGNOSIS — E261 Secondary hyperaldosteronism: Secondary | ICD-10-CM | POA: Diagnosis not present

## 2022-01-17 DIAGNOSIS — I10 Essential (primary) hypertension: Secondary | ICD-10-CM | POA: Diagnosis not present

## 2022-01-17 DIAGNOSIS — E039 Hypothyroidism, unspecified: Secondary | ICD-10-CM | POA: Diagnosis not present

## 2022-01-20 DIAGNOSIS — E669 Obesity, unspecified: Secondary | ICD-10-CM | POA: Diagnosis not present

## 2022-01-20 DIAGNOSIS — I10 Essential (primary) hypertension: Secondary | ICD-10-CM | POA: Diagnosis not present

## 2022-01-21 DIAGNOSIS — M159 Polyosteoarthritis, unspecified: Secondary | ICD-10-CM | POA: Diagnosis not present

## 2022-01-21 DIAGNOSIS — I10 Essential (primary) hypertension: Secondary | ICD-10-CM | POA: Diagnosis not present

## 2022-01-28 DIAGNOSIS — Z6833 Body mass index (BMI) 33.0-33.9, adult: Secondary | ICD-10-CM | POA: Diagnosis not present

## 2022-01-28 DIAGNOSIS — I10 Essential (primary) hypertension: Secondary | ICD-10-CM | POA: Diagnosis not present

## 2022-01-28 DIAGNOSIS — I951 Orthostatic hypotension: Secondary | ICD-10-CM | POA: Diagnosis not present

## 2022-01-29 DIAGNOSIS — I10 Essential (primary) hypertension: Secondary | ICD-10-CM | POA: Diagnosis not present

## 2022-02-01 ENCOUNTER — Encounter: Payer: Self-pay | Admitting: *Deleted

## 2022-02-04 ENCOUNTER — Telehealth: Payer: Self-pay | Admitting: *Deleted

## 2022-02-04 NOTE — Telephone Encounter (Signed)
Left a message with the patient that we would need to reschedule her appointment with Dr. Fletcher Anon tomorrow, 5/16.  ?

## 2022-02-04 NOTE — Telephone Encounter (Signed)
Left a message for the patient to call back. MyChart message has been sent as well.  ?

## 2022-02-05 ENCOUNTER — Ambulatory Visit: Payer: Medicare HMO | Admitting: Cardiovascular Disease

## 2022-02-05 NOTE — Telephone Encounter (Signed)
Left a message that the patient's appointment will need to be rescheduled and to call back.  ?

## 2022-02-07 DIAGNOSIS — N179 Acute kidney failure, unspecified: Secondary | ICD-10-CM | POA: Diagnosis not present

## 2022-02-07 DIAGNOSIS — Z23 Encounter for immunization: Secondary | ICD-10-CM | POA: Diagnosis not present

## 2022-02-07 DIAGNOSIS — I10 Essential (primary) hypertension: Secondary | ICD-10-CM | POA: Diagnosis not present

## 2022-02-07 DIAGNOSIS — I5030 Unspecified diastolic (congestive) heart failure: Secondary | ICD-10-CM | POA: Diagnosis not present

## 2022-02-08 DIAGNOSIS — I10 Essential (primary) hypertension: Secondary | ICD-10-CM | POA: Diagnosis not present

## 2022-02-19 DIAGNOSIS — M542 Cervicalgia: Secondary | ICD-10-CM | POA: Diagnosis not present

## 2022-02-19 DIAGNOSIS — M545 Low back pain, unspecified: Secondary | ICD-10-CM | POA: Diagnosis not present

## 2022-02-20 DIAGNOSIS — I10 Essential (primary) hypertension: Secondary | ICD-10-CM | POA: Diagnosis not present

## 2022-02-21 DIAGNOSIS — Z6833 Body mass index (BMI) 33.0-33.9, adult: Secondary | ICD-10-CM | POA: Diagnosis not present

## 2022-02-21 DIAGNOSIS — M159 Polyosteoarthritis, unspecified: Secondary | ICD-10-CM | POA: Diagnosis not present

## 2022-02-21 DIAGNOSIS — I10 Essential (primary) hypertension: Secondary | ICD-10-CM | POA: Diagnosis not present

## 2022-02-21 DIAGNOSIS — I5030 Unspecified diastolic (congestive) heart failure: Secondary | ICD-10-CM | POA: Diagnosis not present

## 2022-02-26 ENCOUNTER — Ambulatory Visit (INDEPENDENT_AMBULATORY_CARE_PROVIDER_SITE_OTHER): Payer: Medicare HMO | Admitting: Cardiovascular Disease

## 2022-02-26 ENCOUNTER — Encounter: Payer: Self-pay | Admitting: Cardiovascular Disease

## 2022-02-26 VITALS — BP 136/78 | HR 70 | Ht 64.0 in | Wt 200.6 lb

## 2022-02-26 DIAGNOSIS — I2510 Atherosclerotic heart disease of native coronary artery without angina pectoris: Secondary | ICD-10-CM

## 2022-02-26 DIAGNOSIS — I359 Nonrheumatic aortic valve disorder, unspecified: Secondary | ICD-10-CM

## 2022-02-26 DIAGNOSIS — I5032 Chronic diastolic (congestive) heart failure: Secondary | ICD-10-CM | POA: Diagnosis not present

## 2022-02-26 DIAGNOSIS — I5031 Acute diastolic (congestive) heart failure: Secondary | ICD-10-CM

## 2022-02-26 DIAGNOSIS — I10 Essential (primary) hypertension: Secondary | ICD-10-CM

## 2022-02-26 NOTE — Progress Notes (Unsigned)
Cardiology Office Note   Date:  02/26/2022   ID:  WISDOM SEYBOLD, DOB 1941/02/18, MRN [id number]  PCP:  Marco Collie, MD  Cardiologist:   Kathlyn Sacramento, MD   No chief complaint on file.      History of Present Illness: Janice Glass is a 81 y.o. female who presents for a followup visit regarding coronary artery disease, chronic diastolic heart failure and aortic stenosis.   She has known history of coronary artery disease with previous drug-eluting stent placement to the LAD.   She has worsening dyspnea in 2020 and thus a right and left cardiac catheterization was done in September 2020 which showed widely patent proximal LAD stent with no significant restenosis.  There was stable 50% stenosis in the distal LAD and 50% stenosis in the proximal right coronary artery with significant ostial stenosis in a small second diagonal.  Right heart catheterization was normal.  There was 15 mm peak to peak gradient across the aortic valve consistent with mild aortic stenosis. No revascularization was required.  Echocardiogram in August 2022 showed normal LV systolic function, grade 1 diastolic dysfunction and stable mild aortic stenosis with mean gradient of 11 mmHg.  She had a viral illness and November and was also diagnosed with strep throat.  She had some worsening shortness of breath with that but then she developed palpitations and worsening dyspnea and was hospitalized at [facility].  She was initially thought of having atrial fibrillation but it was sinus tachycardia with PACs. She was switched from amlodipine to diltiazem.  2-week ZIO monitor showed sinus rhythm with short runs of SVT without atrial fibrillation.  I increase the dose of diltiazem to 240 mg once daily. She had worsening heart failure symptoms recently and thus the dose of furosemide was increased to 40 mg once daily.  An echocardiogram was repeated which showed normal LV systolic function, grade 2 diastolic  dysfunction with elevated left atrial pressure and mild to moderate aortic stenosis.  She has been doing well and reports improvement in shortness of breath.  No chest pain.  She reports less palpitations than before.  Past Medical History:  Diagnosis Date   Anxiety    CAD (coronary artery disease)    a. 11/2009 Abnl myoview - inferobasal ischemia;  b. 12/2009 PCI/DES to pLAD w/ 3x15 mm Promus DES;  c. Relook Cath 01/08/2010: patent stent, nonobs dzs. Relook cath 02/2012: patent LAD stent with mild disease.   Depression    GERD (gastroesophageal reflux disease)    HLD (hyperlipidemia)    Hypertensive heart disease    Hypothyroidism    Mild Carotid Arterial Disease    a. 04/2015 Carotid U/S: 1-39% bilat ICA stenosis.   Mild mitral regurgitation    a. 08/2015 Echo: Ef 55-60%, no rwma, mild MR.    Past Surgical History:  Procedure Laterality Date   CARDIAC CATHETERIZATION  12/2009   CARDIAC CATHETERIZATION N/A 06/03/2016   Procedure: Left Heart Cath and Coronary Angiography;  Surgeon: Lorretta Harp, MD;  Location: [place] CV LAB;  Service: Cardiovascular;  Laterality: N/A;   CESAREAN SECTION     several    CORONARY ANGIOPLASTY WITH STENT PLACEMENT  12/2009   DES to proximal LAD   CORONARY STENT PLACEMENT     LEFT HEART CATHETERIZATION WITH CORONARY ANGIOGRAM N/A 03/20/2012   Procedure: LEFT HEART CATHETERIZATION WITH CORONARY ANGIOGRAM;  Surgeon: Hillary Bow, MD;  Location: [facility] CATH LAB;  Service: Cardiovascular;  Laterality: N/A;  RIGHT/LEFT HEART CATH AND CORONARY ANGIOGRAPHY N/A 06/02/2019   Procedure: RIGHT/LEFT HEART CATH AND CORONARY ANGIOGRAPHY;  Surgeon: Wellington Hampshire, MD;  Location: [place] CV LAB;  Service: Cardiovascular;  Laterality: N/A;     Current Outpatient Medications  Medication Sig Dispense Refill   acetaminophen (TYLENOL) 500 MG tablet Take 500-1,000 mg by mouth every 8 (eight) hours as needed for mild pain or headache.     anti-nausea (EMETROL)  solution Take 10 mLs by mouth every 15 (fifteen) minutes as needed for nausea or vomiting.     aspirin 81 MG EC tablet Take 1 tablet (81 mg total) by mouth daily.     cetirizine (ZYRTEC) 10 MG tablet Take 1 tablet by mouth daily.     cyclobenzaprine (FLEXERIL) 10 MG tablet Take by mouth as needed.     diltiazem (CARTIA XT) 240 MG 24 hr capsule Take 1 capsule (240 mg total) by mouth daily. 90 capsule 3   fluticasone (FLONASE) 50 MCG/ACT nasal spray Place 2 sprays into both nostrils 2 (two) times daily.      furosemide (LASIX) 40 MG tablet Take 1 tablet by mouth daily.     hydrALAZINE (APRESOLINE) 10 MG tablet Take 10 mg by mouth daily as needed.     levothyroxine (SYNTHROID) 88 MCG tablet Take 88 mcg by mouth daily before breakfast.     loratadine (CLARITIN) 10 MG tablet Take 10 mg by mouth daily.     losartan (COZAAR) 100 MG tablet Take 100 mg by mouth daily.     metoprolol tartrate (LOPRESSOR) 50 MG tablet Take 1 tablet (50 mg total) by mouth 2 (two) times daily. 60 tablet 6   Multiple Vitamins-Minerals (EYE VITAMINS) CAPS Take 1 capsule by mouth daily.     nitroGLYCERIN (NITROSTAT) 0.4 MG SL tablet Place 1 tablet (0.4 mg total) under the tongue every 5 (five) minutes as needed. For chest pain 25 tablet 3   omeprazole (PRILOSEC) 40 MG capsule Take 40 mg by mouth 2 (two) times daily.      ondansetron (ZOFRAN) 4 MG tablet Take 4 mg by mouth every 8 (eight) hours as needed for nausea or vomiting.      potassium chloride (K-DUR) 10 MEQ tablet Take 10 mEq by mouth every evening.      promethazine (PHENERGAN) 25 MG tablet Take 1 tablet (25 mg total) by mouth every 6 (six) hours as needed for nausea or vomiting. 20 tablet 0   rosuvastatin (CRESTOR) 20 MG tablet Take 20 mg by mouth 2 (two) times a week.     traMADol (ULTRAM) 50 MG tablet Take 50 mg by mouth every 12 (twelve) hours as needed (pain).      Vitamin D, Ergocalciferol, (DRISDOL) 1.25 MG (50000 UT) CAPS capsule Take 50,000 Units by mouth every  Thursday.     No current facility-administered medications for this visit.    Allergies:   Ciprofloxacin, Codeine, Gabapentin, Sulfa antibiotics, Sulfonamide derivatives, Wellbutrin [bupropion], and Zolpidem    Social History:  The patient  reports that she has never smoked. She has never used smokeless tobacco. She reports that she does not drink alcohol and does not use drugs.   Family History:  The patient's family history includes Heart failure in her mother.    ROS:  Please see the history of present illness.   Otherwise, review of systems are positive for none.   All other systems are reviewed and negative.    PHYSICAL EXAM: VS:  BP 136/78  Pulse 70   Ht '5\' 4"'$  (1.626 m)   Wt 200 lb 9.6 oz (91 kg)   SpO2 97%   BMI 34.43 kg/m  , BMI Body mass index is 34.43 kg/m. GEN: Well nourished, well developed, in no acute distress  HEENT: normal  Neck: no JVD, carotid bruits, or masses Cardiac: RRR; no rubs, or gallops,. 2/6 systolic ejection murmur in the aortic area.  Trace bilateral leg edema Respiratory:  clear to auscultation bilaterally, normal work of breathing GI: soft, nontender, nondistended, + BS MS: no deformity or atrophy  Skin: warm and dry, no rash Neuro:  Strength and sensation are intact Psych: euthymic mood, full affect    EKG:  EKG is ordered today. EKG showed normal sinus rhythm with minimal LVH and possible old inferior infarct.    Recent Labs: 11/05/2021: BUN 12; Creatinine, Ser 1.05; Potassium 3.5; Sodium 137    Lipid Panel    Component Value Date/Time   CHOL 168 06/01/2016 0333   TRIG 145 06/01/2016 0333   HDL 55 06/01/2016 0333   CHOLHDL 3.1 06/01/2016 0333   VLDL 29 06/01/2016 0333   LDLCALC 84 06/01/2016 0333      Wt Readings from Last 3 Encounters:  02/26/22 200 lb 9.6 oz (91 kg)  11/06/21 200 lb (90.7 kg)  09/04/21 204 lb (92.5 kg)           View : No data to display.            ASSESSMENT AND PLAN:  1.  Chronic  diastolic heart failure: She appears to be euvolemic on current dose of furosemide 40 mg once daily.  Blood pressure is also controlled.  Continue same medications.  An SGLT2 inhibitor can be considered if she has recurrent symptoms if she might be at higher risk for yeast infections.    2. Coronary artery disease involving native coronary arteries without angina: Stable overall.  Continue medical therapy.   2.  Aortic stenosis: This was mild to moderate on most recent echocardiogram in February of this year.  Repeat echocardiogram in February 2024.     3. Essential hypertension: Blood pressure is controlled. 4. Hyperlipidemia: She was recently switched from simvastatin to rosuvastatin.  5.  SVT and PACs: Significant improvement in symptoms with diltiazem and metoprolol.    Disposition: Follow-up with me in 6 months.  Signed,  Kathlyn Sacramento, MD  02/26/2022 11:35 AM    [place]

## 2022-02-26 NOTE — Patient Instructions (Signed)
Medication Instructions:  Your physician recommends that you continue on your current medications as directed. Please refer to the Current Medication list given to you today.  *If you need a refill on your cardiac medications before your next appointment, please call your pharmacy*  Follow-Up: At [facility], you and your health needs are our priority.  As part of our continuing mission to provide you with exceptional heart care, we have created designated Provider Care Teams.  These Care Teams include your primary Cardiologist (physician) and Advanced Practice Providers (APPs -  Physician Assistants and Nurse Practitioners) who all work together to provide you with the care you need, when you need it.  We recommend signing up for the patient portal called "MyChart".  Sign up information is provided on this After Visit Summary.  MyChart is used to connect with patients for Virtual Visits (Telemedicine).  Patients are able to view lab/test results, encounter notes, upcoming appointments, etc.  Non-urgent messages can be sent to your provider as well.   To learn more about what you can do with MyChart, go to [email or web address].    Your next appointment:   6 month(s)  The format for your next appointment:   In Person  Provider:   Kathlyn Sacramento, MD {    Important Information About Sugar

## 2022-02-28 DIAGNOSIS — I10 Essential (primary) hypertension: Secondary | ICD-10-CM | POA: Diagnosis not present

## 2022-02-28 DIAGNOSIS — I5030 Unspecified diastolic (congestive) heart failure: Secondary | ICD-10-CM | POA: Diagnosis not present

## 2022-03-22 DIAGNOSIS — I10 Essential (primary) hypertension: Secondary | ICD-10-CM | POA: Diagnosis not present

## 2022-03-22 DIAGNOSIS — E669 Obesity, unspecified: Secondary | ICD-10-CM | POA: Diagnosis not present

## 2022-03-23 DIAGNOSIS — E785 Hyperlipidemia, unspecified: Secondary | ICD-10-CM | POA: Diagnosis not present

## 2022-03-23 DIAGNOSIS — E261 Secondary hyperaldosteronism: Secondary | ICD-10-CM | POA: Diagnosis not present

## 2022-03-27 ENCOUNTER — Other Ambulatory Visit: Payer: Self-pay | Admitting: Family Medicine

## 2022-03-27 DIAGNOSIS — Z1231 Encounter for screening mammogram for malignant neoplasm of breast: Secondary | ICD-10-CM

## 2022-04-22 DIAGNOSIS — I10 Essential (primary) hypertension: Secondary | ICD-10-CM | POA: Diagnosis not present

## 2022-04-22 DIAGNOSIS — E669 Obesity, unspecified: Secondary | ICD-10-CM | POA: Diagnosis not present

## 2022-04-23 DIAGNOSIS — E261 Secondary hyperaldosteronism: Secondary | ICD-10-CM | POA: Diagnosis not present

## 2022-04-23 DIAGNOSIS — E785 Hyperlipidemia, unspecified: Secondary | ICD-10-CM | POA: Diagnosis not present

## 2022-05-03 DIAGNOSIS — E785 Hyperlipidemia, unspecified: Secondary | ICD-10-CM | POA: Diagnosis not present

## 2022-05-03 DIAGNOSIS — E039 Hypothyroidism, unspecified: Secondary | ICD-10-CM | POA: Diagnosis not present

## 2022-05-03 DIAGNOSIS — I10 Essential (primary) hypertension: Secondary | ICD-10-CM | POA: Diagnosis not present

## 2022-05-09 DIAGNOSIS — I5030 Unspecified diastolic (congestive) heart failure: Secondary | ICD-10-CM | POA: Diagnosis not present

## 2022-05-09 DIAGNOSIS — Z1331 Encounter for screening for depression: Secondary | ICD-10-CM | POA: Diagnosis not present

## 2022-05-09 DIAGNOSIS — E669 Obesity, unspecified: Secondary | ICD-10-CM | POA: Diagnosis not present

## 2022-05-09 DIAGNOSIS — Z139 Encounter for screening, unspecified: Secondary | ICD-10-CM | POA: Diagnosis not present

## 2022-05-09 DIAGNOSIS — I10 Essential (primary) hypertension: Secondary | ICD-10-CM | POA: Diagnosis not present

## 2022-05-09 DIAGNOSIS — E039 Hypothyroidism, unspecified: Secondary | ICD-10-CM | POA: Diagnosis not present

## 2022-05-09 DIAGNOSIS — Z0000 Encounter for general adult medical examination without abnormal findings: Secondary | ICD-10-CM | POA: Diagnosis not present

## 2022-05-09 DIAGNOSIS — I714 Abdominal aortic aneurysm, without rupture, unspecified: Secondary | ICD-10-CM | POA: Diagnosis not present

## 2022-05-09 DIAGNOSIS — Z6835 Body mass index (BMI) 35.0-35.9, adult: Secondary | ICD-10-CM | POA: Diagnosis not present

## 2022-05-09 DIAGNOSIS — E785 Hyperlipidemia, unspecified: Secondary | ICD-10-CM | POA: Diagnosis not present

## 2022-05-13 ENCOUNTER — Ambulatory Visit
Admission: RE | Admit: 2022-05-13 | Discharge: 2022-05-13 | Disposition: A | Discharge status: Home / Self Care | Payer: Medicare HMO | Source: Ambulatory Visit | Attending: Family Medicine | Admitting: Family Medicine

## 2022-05-13 DIAGNOSIS — Z1231 Encounter for screening mammogram for malignant neoplasm of breast: Secondary | ICD-10-CM | POA: Diagnosis not present

## 2022-05-23 DIAGNOSIS — J449 Chronic obstructive pulmonary disease, unspecified: Secondary | ICD-10-CM | POA: Diagnosis not present

## 2022-05-23 DIAGNOSIS — I10 Essential (primary) hypertension: Secondary | ICD-10-CM | POA: Diagnosis not present

## 2022-05-23 DIAGNOSIS — M25572 Pain in left ankle and joints of left foot: Secondary | ICD-10-CM | POA: Diagnosis not present

## 2022-05-24 DIAGNOSIS — E785 Hyperlipidemia, unspecified: Secondary | ICD-10-CM | POA: Diagnosis not present

## 2022-05-24 DIAGNOSIS — E039 Hypothyroidism, unspecified: Secondary | ICD-10-CM | POA: Diagnosis not present

## 2022-06-19 DIAGNOSIS — M19072 Primary osteoarthritis, left ankle and foot: Secondary | ICD-10-CM | POA: Diagnosis not present

## 2022-06-19 DIAGNOSIS — N1831 Chronic kidney disease, stage 3a: Secondary | ICD-10-CM | POA: Diagnosis not present

## 2022-06-22 DIAGNOSIS — N179 Acute kidney failure, unspecified: Secondary | ICD-10-CM | POA: Diagnosis not present

## 2022-06-22 DIAGNOSIS — J449 Chronic obstructive pulmonary disease, unspecified: Secondary | ICD-10-CM | POA: Diagnosis not present

## 2022-06-23 DIAGNOSIS — E039 Hypothyroidism, unspecified: Secondary | ICD-10-CM | POA: Diagnosis not present

## 2022-06-23 DIAGNOSIS — E785 Hyperlipidemia, unspecified: Secondary | ICD-10-CM | POA: Diagnosis not present

## 2022-07-18 DIAGNOSIS — Z6836 Body mass index (BMI) 36.0-36.9, adult: Secondary | ICD-10-CM | POA: Diagnosis not present

## 2022-07-18 DIAGNOSIS — I10 Essential (primary) hypertension: Secondary | ICD-10-CM | POA: Diagnosis not present

## 2022-07-18 DIAGNOSIS — E039 Hypothyroidism, unspecified: Secondary | ICD-10-CM | POA: Diagnosis not present

## 2022-07-18 DIAGNOSIS — N1831 Chronic kidney disease, stage 3a: Secondary | ICD-10-CM | POA: Diagnosis not present

## 2022-07-18 DIAGNOSIS — I5030 Unspecified diastolic (congestive) heart failure: Secondary | ICD-10-CM | POA: Diagnosis not present

## 2022-07-20 DIAGNOSIS — T23001A Burn of unspecified degree of right hand, unspecified site, initial encounter: Secondary | ICD-10-CM | POA: Diagnosis not present

## 2022-07-20 DIAGNOSIS — M79641 Pain in right hand: Secondary | ICD-10-CM | POA: Diagnosis not present

## 2022-07-23 DIAGNOSIS — J449 Chronic obstructive pulmonary disease, unspecified: Secondary | ICD-10-CM | POA: Diagnosis not present

## 2022-07-23 DIAGNOSIS — N179 Acute kidney failure, unspecified: Secondary | ICD-10-CM | POA: Diagnosis not present

## 2022-07-24 DIAGNOSIS — E261 Secondary hyperaldosteronism: Secondary | ICD-10-CM | POA: Diagnosis not present

## 2022-07-24 DIAGNOSIS — E039 Hypothyroidism, unspecified: Secondary | ICD-10-CM | POA: Diagnosis not present

## 2022-08-07 DIAGNOSIS — M25572 Pain in left ankle and joints of left foot: Secondary | ICD-10-CM | POA: Diagnosis not present

## 2022-08-07 DIAGNOSIS — M19172 Post-traumatic osteoarthritis, left ankle and foot: Secondary | ICD-10-CM | POA: Diagnosis not present

## 2022-08-07 DIAGNOSIS — N1831 Chronic kidney disease, stage 3a: Secondary | ICD-10-CM | POA: Diagnosis not present

## 2022-08-22 DIAGNOSIS — N179 Acute kidney failure, unspecified: Secondary | ICD-10-CM | POA: Diagnosis not present

## 2022-08-22 DIAGNOSIS — J449 Chronic obstructive pulmonary disease, unspecified: Secondary | ICD-10-CM | POA: Diagnosis not present

## 2022-08-23 DIAGNOSIS — E039 Hypothyroidism, unspecified: Secondary | ICD-10-CM | POA: Diagnosis not present

## 2022-08-23 DIAGNOSIS — E785 Hyperlipidemia, unspecified: Secondary | ICD-10-CM | POA: Diagnosis not present

## 2022-08-28 DIAGNOSIS — M47816 Spondylosis without myelopathy or radiculopathy, lumbar region: Secondary | ICD-10-CM | POA: Diagnosis not present

## 2022-08-29 DIAGNOSIS — H7011 Chronic mastoiditis, right ear: Secondary | ICD-10-CM | POA: Diagnosis not present

## 2022-08-29 DIAGNOSIS — Z79899 Other long term (current) drug therapy: Secondary | ICD-10-CM | POA: Diagnosis not present

## 2022-08-29 DIAGNOSIS — R4182 Altered mental status, unspecified: Secondary | ICD-10-CM | POA: Diagnosis not present

## 2022-08-29 DIAGNOSIS — R9431 Abnormal electrocardiogram [ECG] [EKG]: Secondary | ICD-10-CM | POA: Diagnosis not present

## 2022-08-29 DIAGNOSIS — G319 Degenerative disease of nervous system, unspecified: Secondary | ICD-10-CM | POA: Diagnosis not present

## 2022-08-29 DIAGNOSIS — I10 Essential (primary) hypertension: Secondary | ICD-10-CM | POA: Diagnosis not present

## 2022-08-29 DIAGNOSIS — Z7982 Long term (current) use of aspirin: Secondary | ICD-10-CM | POA: Diagnosis not present

## 2022-08-29 DIAGNOSIS — R42 Dizziness and giddiness: Secondary | ICD-10-CM | POA: Diagnosis not present

## 2022-08-29 DIAGNOSIS — W19XXXA Unspecified fall, initial encounter: Secondary | ICD-10-CM | POA: Diagnosis not present

## 2022-08-31 DIAGNOSIS — R300 Dysuria: Secondary | ICD-10-CM | POA: Diagnosis not present

## 2022-08-31 DIAGNOSIS — N3091 Cystitis, unspecified with hematuria: Secondary | ICD-10-CM | POA: Diagnosis not present

## 2022-09-02 ENCOUNTER — Encounter: Payer: Self-pay | Admitting: Cardiovascular Disease

## 2022-09-03 ENCOUNTER — Ambulatory Visit: Payer: Medicare HMO | Admitting: Cardiovascular Disease

## 2022-09-05 DIAGNOSIS — I5030 Unspecified diastolic (congestive) heart failure: Secondary | ICD-10-CM | POA: Diagnosis not present

## 2022-09-05 DIAGNOSIS — N1832 Chronic kidney disease, stage 3b: Secondary | ICD-10-CM | POA: Diagnosis not present

## 2022-09-05 DIAGNOSIS — Z6836 Body mass index (BMI) 36.0-36.9, adult: Secondary | ICD-10-CM | POA: Diagnosis not present

## 2022-09-05 DIAGNOSIS — E039 Hypothyroidism, unspecified: Secondary | ICD-10-CM | POA: Diagnosis not present

## 2022-09-05 DIAGNOSIS — Z7689 Persons encountering health services in other specified circumstances: Secondary | ICD-10-CM | POA: Diagnosis not present

## 2022-09-05 DIAGNOSIS — Z9181 History of falling: Secondary | ICD-10-CM | POA: Diagnosis not present

## 2022-09-10 ENCOUNTER — Telehealth: Payer: Self-pay

## 2022-09-10 ENCOUNTER — Encounter: Payer: Self-pay | Admitting: Cardiovascular Disease

## 2022-09-10 ENCOUNTER — Ambulatory Visit: Payer: Medicare HMO | Attending: Cardiovascular Disease | Admitting: Cardiovascular Disease

## 2022-09-10 VITALS — BP 148/80 | HR 95

## 2022-09-10 DIAGNOSIS — I350 Nonrheumatic aortic (valve) stenosis: Secondary | ICD-10-CM

## 2022-09-10 DIAGNOSIS — E785 Hyperlipidemia, unspecified: Secondary | ICD-10-CM

## 2022-09-10 DIAGNOSIS — I471 Supraventricular tachycardia, unspecified: Secondary | ICD-10-CM | POA: Diagnosis not present

## 2022-09-10 DIAGNOSIS — I5032 Chronic diastolic (congestive) heart failure: Secondary | ICD-10-CM

## 2022-09-10 DIAGNOSIS — I10 Essential (primary) hypertension: Secondary | ICD-10-CM | POA: Diagnosis not present

## 2022-09-10 DIAGNOSIS — I2510 Atherosclerotic heart disease of native coronary artery without angina pectoris: Secondary | ICD-10-CM | POA: Diagnosis not present

## 2022-09-10 NOTE — Patient Instructions (Signed)
Medication Instructions:  No changes *If you need a refill on your cardiac medications before your next appointment, please call your pharmacy*   Lab Work: None ordered If you have labs (blood work) drawn today and your tests are completely normal, you will receive your results only by: [place] (if you have MyChart) OR A paper copy in the mail If you have any lab test that is abnormal or we need to change your treatment, we will call you to review the results.   Testing/Procedures: Your physician has requested that you have an echocardiogram in February 2024. Echocardiography is a painless test that uses sound waves to create images of your heart. It provides your doctor with information about the size and shape of your heart and how well your heart's chambers and valves are working. You may receive an ultrasound enhancing agent through an IV if needed to better visualize your heart during the echo.This procedure takes approximately one hour. There are no restrictions for this procedure. This will take place at the [address].     Follow-Up: At [facility], you and your health needs are our priority.  As part of our continuing mission to provide you with exceptional heart care, we have created designated Provider Care Teams.  These Care Teams include your primary Cardiologist (physician) and Advanced Practice Providers (APPs -  Physician Assistants and Nurse Practitioners) who all work together to provide you with the care you need, when you need it.  We recommend signing up for the patient portal called "MyChart".  Sign up information is provided on this After Visit Summary.  MyChart is used to connect with patients for Virtual Visits (Telemedicine).  Patients are able to view lab/test results, encounter notes, upcoming appointments, etc.  Non-urgent messages can be sent to your provider as well.   To learn more about what you can do with MyChart, go to  [email or web address].    Your next appointment:   6 month(s)  The format for your next appointment:   In Person  Provider:   Kathlyn Sacramento, MD

## 2022-09-10 NOTE — Telephone Encounter (Signed)
        Patient  visited [place] on 12/7    Telephone encounter attempt :  1st  A HIPAA compliant voice message was left requesting a return call.  Instructed patient to call back .    [facility], Care Management  [phone] [address], [place], [place] [address] Phone: [phone] Email: [email or web address]

## 2022-09-10 NOTE — Progress Notes (Signed)
Cardiology Office Note   Date:  09/10/2022   ID:  Janice Glass, DOB 1941-05-04, MRN [id number]  PCP:  Marco Collie, MD  Cardiologist:   Kathlyn Sacramento, MD   No chief complaint on file.      History of Present Illness: Janice Glass is a 81 y.o. female who presents for a followup visit regarding coronary artery disease, chronic diastolic heart failure and aortic stenosis.  She has known history of coronary artery disease with previous drug-eluting stent placement to the LAD.   She has worsening dyspnea in 2020 and thus a right and left cardiac catheterization was done in September 2020 which showed widely patent proximal LAD stent with no significant restenosis.  There was stable 50% stenosis in the distal LAD and 50% stenosis in the proximal right coronary artery with significant ostial stenosis in a small second diagonal.  Right heart catheterization was normal.  There was 15 mm peak to peak gradient across the aortic valve consistent with mild aortic stenosis. No revascularization was required.  Echocardiogram in August 2022 showed normal LV systolic function, grade 1 diastolic dysfunction and stable mild aortic stenosis with mean gradient of 11 mmHg.  She had a viral illness and November, 2022 and was also diagnosed with strep throat.  She had worsening shortness of breath with that but then she developed palpitations and worsening dyspnea and was hospitalized at [facility].  She was initially thought of having atrial fibrillation but it was sinus tachycardia with PACs. She was switched from amlodipine to diltiazem.  2-week ZIO monitor showed sinus rhythm with short runs of SVT without atrial fibrillation.  I increased the dose of diltiazem to 240 mg once daily.  She had worsening heart failure symptoms in February and thus the dose of furosemide was increased to 40 mg once daily.  An echocardiogram was repeated which showed normal LV systolic function, grade 2  diastolic dysfunction with elevated left atrial pressure and mild to moderate aortic stenosis.  She reports having a UTI recently and currently finishing a course of an antibiotic.  She reports stable exertional dyspnea with no chest pain.  She has intermittent palpitations but episodes do not last long.   Past Medical History:  Diagnosis Date   Anxiety    CAD (coronary artery disease)    a. 11/2009 Abnl myoview - inferobasal ischemia;  b. 12/2009 PCI/DES to pLAD w/ 3x15 mm Promus DES;  c. Relook Cath 01/08/2010: patent stent, nonobs dzs. Relook cath 02/2012: patent LAD stent with mild disease.   Depression    GERD (gastroesophageal reflux disease)    HLD (hyperlipidemia)    Hypertensive heart disease    Hypothyroidism    Mild Carotid Arterial Disease    a. 04/2015 Carotid U/S: 1-39% bilat ICA stenosis.   Mild mitral regurgitation    a. 08/2015 Echo: Ef 55-60%, no rwma, mild MR.    Past Surgical History:  Procedure Laterality Date   CARDIAC CATHETERIZATION  12/2009   CARDIAC CATHETERIZATION N/A 06/03/2016   Procedure: Left Heart Cath and Coronary Angiography;  Surgeon: Lorretta Harp, MD;  Location: [place] [facility];  Service: Cardiovascular;  Laterality: N/A;   CESAREAN SECTION     several    CORONARY ANGIOPLASTY WITH STENT PLACEMENT  12/2009   DES to proximal LAD   CORONARY STENT PLACEMENT     LEFT HEART CATHETERIZATION WITH CORONARY ANGIOGRAM N/A 03/20/2012   Procedure: LEFT HEART CATHETERIZATION WITH CORONARY ANGIOGRAM;  Surgeon: Loretha Brasil  Lia Foyer, MD;  Location: [place] CATH LAB;  Service: Cardiovascular;  Laterality: N/A;   RIGHT/LEFT HEART CATH AND CORONARY ANGIOGRAPHY N/A 06/02/2019   Procedure: RIGHT/LEFT HEART CATH AND CORONARY ANGIOGRAPHY;  Surgeon: Wellington Hampshire, MD;  Location: [place] [facility];  Service: Cardiovascular;  Laterality: N/A;     Current Outpatient Medications  Medication Sig Dispense Refill   acetaminophen (TYLENOL) 500 MG tablet Take 500-1,000 mg by  mouth every 8 (eight) hours as needed for mild pain or headache.     anti-nausea (EMETROL) solution Take 10 mLs by mouth every 15 (fifteen) minutes as needed for nausea or vomiting.     aspirin 81 MG EC tablet Take 1 tablet (81 mg total) by mouth daily.     cetirizine (ZYRTEC) 10 MG tablet Take 1 tablet by mouth daily.     diltiazem (CARTIA XT) 240 MG 24 hr capsule Take 1 capsule (240 mg total) by mouth daily. 90 capsule 3   fluticasone (FLONASE) 50 MCG/ACT nasal spray Place 2 sprays into both nostrils 2 (two) times daily.      furosemide (LASIX) 40 MG tablet Take 1 tablet by mouth daily.     levothyroxine (SYNTHROID) 88 MCG tablet Take 88 mcg by mouth daily before breakfast.     loratadine (CLARITIN) 10 MG tablet Take 10 mg by mouth daily.     losartan (COZAAR) 100 MG tablet Take 100 mg by mouth daily.     metoprolol tartrate (LOPRESSOR) 50 MG tablet Take 1 tablet (50 mg total) by mouth 2 (two) times daily. 60 tablet 6   Multiple Vitamins-Minerals (EYE VITAMINS) CAPS Take 1 capsule by mouth daily.     nitroGLYCERIN (NITROSTAT) 0.4 MG SL tablet Place 1 tablet (0.4 mg total) under the tongue every 5 (five) minutes as needed. For chest pain 25 tablet 3   omeprazole (PRILOSEC) 40 MG capsule Take 40 mg by mouth 2 (two) times daily.      ondansetron (ZOFRAN) 4 MG tablet Take 4 mg by mouth every 8 (eight) hours as needed for nausea or vomiting.      potassium chloride (K-DUR) 10 MEQ tablet Take 10 mEq by mouth every evening.      promethazine (PHENERGAN) 25 MG tablet Take 1 tablet (25 mg total) by mouth every 6 (six) hours as needed for nausea or vomiting. 20 tablet 0   rosuvastatin (CRESTOR) 20 MG tablet Take 20 mg by mouth 2 (two) times a week.     traMADol (ULTRAM) 50 MG tablet Take 50 mg by mouth every 12 (twelve) hours as needed (pain).      Vitamin D, Ergocalciferol, (DRISDOL) 1.25 MG (50000 UT) CAPS capsule Take 50,000 Units by mouth every Thursday.     hydrALAZINE (APRESOLINE) 10 MG tablet Take  10 mg by mouth daily as needed.     No current facility-administered medications for this visit.    Allergies:   Ciprofloxacin, Codeine, Gabapentin, Sulfa antibiotics, Sulfonamide derivatives, Wellbutrin [bupropion], and Zolpidem    Social History:  The patient  reports that she has never smoked. She has never used smokeless tobacco. She reports that she does not drink alcohol and does not use drugs.   Family History:  The patient's family history includes Heart failure in her mother.    ROS:  Please see the history of present illness.   Otherwise, review of systems are positive for none.   All other systems are reviewed and negative.    PHYSICAL EXAM: VS:  BP Marland Kitchen)  148/80   Pulse 95  , BMI There is no height or weight on file to calculate BMI. GEN: Well nourished, well developed, in no acute distress  HEENT: normal  Neck: no JVD, carotid bruits, or masses Cardiac: RRR; no rubs, or gallops,. 2/6 systolic ejection murmur in the aortic area.  Trace bilateral leg edema Respiratory:  clear to auscultation bilaterally, normal work of breathing GI: soft, nontender, nondistended, + BS MS: no deformity or atrophy  Skin: warm and dry, no rash Neuro:  Strength and sensation are intact Psych: euthymic mood, full affect    EKG:  EKG is ordered today. EKG showed normal sinus rhythm with minimal LVH .  Possible left atrial enlargement.    Recent Labs: 11/05/2021: BUN 12; Creatinine, Ser 1.05; Potassium 3.5; Sodium 137    Lipid Panel    Component Value Date/Time   CHOL 168 06/01/2016 0333   TRIG 145 06/01/2016 0333   HDL 55 06/01/2016 0333   CHOLHDL 3.1 06/01/2016 0333   VLDL 29 06/01/2016 0333   LDLCALC 84 06/01/2016 0333      Wt Readings from Last 3 Encounters:  02/26/22 200 lb 9.6 oz (91 kg)  11/06/21 200 lb (90.7 kg)  09/04/21 204 lb (92.5 kg)           No data to display            ASSESSMENT AND PLAN:  1.  Chronic diastolic heart failure: She appears to be  euvolemic on current dose of furosemide 40 mg once daily.  Blood pressure is reasonably controlled.  Continue same medications.  She is not a good candidate for an SGLT2 inhibitor especially with recent UTI and increased risk of infection.  2. Coronary artery disease involving native coronary arteries without angina: Stable overall.  Continue medical therapy.   2.  Aortic stenosis: This was mild to moderate on most recent echocardiogram in February of this year.  I requested a follow-up echocardiogram to be done in February 2024.   3. Essential hypertension: Blood pressure is controlled.  4. Hyperlipidemia: Continue treatment with rosuvastatin with a target LDL of less than 70.  5.  SVT and PACs: Significant improvement in symptoms with diltiazem and metoprolol.    Disposition: Follow-up with me in 6 months.  Signed,  Kathlyn Sacramento, MD  09/10/2022 11:29 AM    [place]

## 2022-09-11 ENCOUNTER — Telehealth: Payer: Self-pay

## 2022-09-11 DIAGNOSIS — M19172 Post-traumatic osteoarthritis, left ankle and foot: Secondary | ICD-10-CM | POA: Diagnosis not present

## 2022-09-11 DIAGNOSIS — N1831 Chronic kidney disease, stage 3a: Secondary | ICD-10-CM | POA: Diagnosis not present

## 2022-09-11 NOTE — Telephone Encounter (Signed)
        Patient  visited [facility] on 12/7  Telephone encounter attempt :  2nd  A HIPAA compliant voice message was left requesting a return call.  Instructed patient to call back .    [facility], Care Management  [phone] [address], [place], [place] [address] Phone: [phone] Email: [email or web address]

## 2022-09-21 DIAGNOSIS — N179 Acute kidney failure, unspecified: Secondary | ICD-10-CM | POA: Diagnosis not present

## 2022-09-21 DIAGNOSIS — J449 Chronic obstructive pulmonary disease, unspecified: Secondary | ICD-10-CM | POA: Diagnosis not present

## 2022-09-23 DIAGNOSIS — I10 Essential (primary) hypertension: Secondary | ICD-10-CM | POA: Diagnosis not present

## 2022-09-23 DIAGNOSIS — E039 Hypothyroidism, unspecified: Secondary | ICD-10-CM | POA: Diagnosis not present

## 2022-09-25 DIAGNOSIS — Z6835 Body mass index (BMI) 35.0-35.9, adult: Secondary | ICD-10-CM | POA: Diagnosis not present

## 2022-09-25 DIAGNOSIS — I5030 Unspecified diastolic (congestive) heart failure: Secondary | ICD-10-CM | POA: Diagnosis not present

## 2022-09-25 DIAGNOSIS — Z139 Encounter for screening, unspecified: Secondary | ICD-10-CM | POA: Diagnosis not present

## 2022-09-25 DIAGNOSIS — Z713 Dietary counseling and surveillance: Secondary | ICD-10-CM | POA: Diagnosis not present

## 2022-10-02 DIAGNOSIS — Z6835 Body mass index (BMI) 35.0-35.9, adult: Secondary | ICD-10-CM | POA: Diagnosis not present

## 2022-10-02 DIAGNOSIS — J449 Chronic obstructive pulmonary disease, unspecified: Secondary | ICD-10-CM | POA: Diagnosis not present

## 2022-10-02 DIAGNOSIS — Z713 Dietary counseling and surveillance: Secondary | ICD-10-CM | POA: Diagnosis not present

## 2022-10-02 DIAGNOSIS — I5030 Unspecified diastolic (congestive) heart failure: Secondary | ICD-10-CM | POA: Diagnosis not present

## 2022-10-08 ENCOUNTER — Ambulatory Visit: Payer: Medicare HMO | Admitting: Cardiovascular Disease

## 2022-10-16 DIAGNOSIS — Z6834 Body mass index (BMI) 34.0-34.9, adult: Secondary | ICD-10-CM | POA: Diagnosis not present

## 2022-10-16 DIAGNOSIS — Z139 Encounter for screening, unspecified: Secondary | ICD-10-CM | POA: Diagnosis not present

## 2022-10-16 DIAGNOSIS — N1832 Chronic kidney disease, stage 3b: Secondary | ICD-10-CM | POA: Diagnosis not present

## 2022-10-16 DIAGNOSIS — Z713 Dietary counseling and surveillance: Secondary | ICD-10-CM | POA: Diagnosis not present

## 2022-10-17 DIAGNOSIS — N390 Urinary tract infection, site not specified: Secondary | ICD-10-CM | POA: Diagnosis not present

## 2022-10-17 DIAGNOSIS — J069 Acute upper respiratory infection, unspecified: Secondary | ICD-10-CM | POA: Diagnosis not present

## 2022-10-17 DIAGNOSIS — J029 Acute pharyngitis, unspecified: Secondary | ICD-10-CM | POA: Diagnosis not present

## 2022-10-17 DIAGNOSIS — Z20822 Contact with and (suspected) exposure to covid-19: Secondary | ICD-10-CM | POA: Diagnosis not present

## 2022-10-17 DIAGNOSIS — R300 Dysuria: Secondary | ICD-10-CM | POA: Diagnosis not present

## 2022-10-22 DIAGNOSIS — J449 Chronic obstructive pulmonary disease, unspecified: Secondary | ICD-10-CM | POA: Diagnosis not present

## 2022-10-22 DIAGNOSIS — N1831 Chronic kidney disease, stage 3a: Secondary | ICD-10-CM | POA: Diagnosis not present

## 2022-10-22 DIAGNOSIS — M19172 Post-traumatic osteoarthritis, left ankle and foot: Secondary | ICD-10-CM | POA: Diagnosis not present

## 2022-10-23 DIAGNOSIS — Z6835 Body mass index (BMI) 35.0-35.9, adult: Secondary | ICD-10-CM | POA: Diagnosis not present

## 2022-10-23 DIAGNOSIS — Z713 Dietary counseling and surveillance: Secondary | ICD-10-CM | POA: Diagnosis not present

## 2022-10-23 DIAGNOSIS — I5030 Unspecified diastolic (congestive) heart failure: Secondary | ICD-10-CM | POA: Diagnosis not present

## 2022-10-24 ENCOUNTER — Other Ambulatory Visit (HOSPITAL_COMMUNITY): Payer: Medicare HMO

## 2022-10-24 DIAGNOSIS — J449 Chronic obstructive pulmonary disease, unspecified: Secondary | ICD-10-CM | POA: Diagnosis not present

## 2022-10-28 DIAGNOSIS — M19172 Post-traumatic osteoarthritis, left ankle and foot: Secondary | ICD-10-CM | POA: Diagnosis not present

## 2022-10-30 DIAGNOSIS — Z139 Encounter for screening, unspecified: Secondary | ICD-10-CM | POA: Diagnosis not present

## 2022-10-30 DIAGNOSIS — J449 Chronic obstructive pulmonary disease, unspecified: Secondary | ICD-10-CM | POA: Diagnosis not present

## 2022-10-30 DIAGNOSIS — Z6834 Body mass index (BMI) 34.0-34.9, adult: Secondary | ICD-10-CM | POA: Diagnosis not present

## 2022-10-30 DIAGNOSIS — I25119 Atherosclerotic heart disease of native coronary artery with unspecified angina pectoris: Secondary | ICD-10-CM | POA: Diagnosis not present

## 2022-10-30 DIAGNOSIS — Z713 Dietary counseling and surveillance: Secondary | ICD-10-CM | POA: Diagnosis not present

## 2022-11-06 DIAGNOSIS — Z6834 Body mass index (BMI) 34.0-34.9, adult: Secondary | ICD-10-CM | POA: Diagnosis not present

## 2022-11-06 DIAGNOSIS — Z713 Dietary counseling and surveillance: Secondary | ICD-10-CM | POA: Diagnosis not present

## 2022-11-06 DIAGNOSIS — I5030 Unspecified diastolic (congestive) heart failure: Secondary | ICD-10-CM | POA: Diagnosis not present

## 2022-11-06 DIAGNOSIS — Z139 Encounter for screening, unspecified: Secondary | ICD-10-CM | POA: Diagnosis not present

## 2022-11-13 ENCOUNTER — Ambulatory Visit (HOSPITAL_COMMUNITY): Payer: Medicare HMO | Attending: Internal Medicine

## 2022-11-13 ENCOUNTER — Other Ambulatory Visit (HOSPITAL_COMMUNITY): Payer: Medicare HMO

## 2022-11-13 DIAGNOSIS — I350 Nonrheumatic aortic (valve) stenosis: Secondary | ICD-10-CM | POA: Diagnosis not present

## 2022-11-13 LAB — ECHOCARDIOGRAM COMPLETE
AR max vel: 1.18 cm2
AV Area VTI: 1.21 cm2
AV Area mean vel: 1.18 cm2
AV Mean grad: 23 mmHg
AV Peak grad: 39.3 mmHg
Ao pk vel: 3.14 m/s
Area-P 1/2: 1.98 cm2
Est EF: >75
S' Lateral: 2.5 cm

## 2022-11-15 DIAGNOSIS — J9811 Atelectasis: Secondary | ICD-10-CM | POA: Diagnosis not present

## 2022-11-15 DIAGNOSIS — S0121XA Laceration without foreign body of nose, initial encounter: Secondary | ICD-10-CM | POA: Diagnosis not present

## 2022-11-15 DIAGNOSIS — S022XXB Fracture of nasal bones, initial encounter for open fracture: Secondary | ICD-10-CM | POA: Diagnosis not present

## 2022-11-15 DIAGNOSIS — E876 Hypokalemia: Secondary | ICD-10-CM | POA: Diagnosis not present

## 2022-11-15 DIAGNOSIS — E7800 Pure hypercholesterolemia, unspecified: Secondary | ICD-10-CM | POA: Diagnosis not present

## 2022-11-15 DIAGNOSIS — I10 Essential (primary) hypertension: Secondary | ICD-10-CM | POA: Diagnosis not present

## 2022-11-15 DIAGNOSIS — R9431 Abnormal electrocardiogram [ECG] [EKG]: Secondary | ICD-10-CM | POA: Diagnosis not present

## 2022-11-15 DIAGNOSIS — N179 Acute kidney failure, unspecified: Secondary | ICD-10-CM | POA: Diagnosis not present

## 2022-11-15 DIAGNOSIS — E860 Dehydration: Secondary | ICD-10-CM | POA: Diagnosis not present

## 2022-11-15 DIAGNOSIS — S0003XA Contusion of scalp, initial encounter: Secondary | ICD-10-CM | POA: Diagnosis not present

## 2022-11-15 DIAGNOSIS — R42 Dizziness and giddiness: Secondary | ICD-10-CM | POA: Diagnosis not present

## 2022-11-15 DIAGNOSIS — R55 Syncope and collapse: Secondary | ICD-10-CM | POA: Diagnosis not present

## 2022-11-15 DIAGNOSIS — I6523 Occlusion and stenosis of bilateral carotid arteries: Secondary | ICD-10-CM | POA: Diagnosis not present

## 2022-11-15 DIAGNOSIS — M4312 Spondylolisthesis, cervical region: Secondary | ICD-10-CM | POA: Diagnosis not present

## 2022-11-15 DIAGNOSIS — I2510 Atherosclerotic heart disease of native coronary artery without angina pectoris: Secondary | ICD-10-CM | POA: Diagnosis not present

## 2022-11-15 DIAGNOSIS — W19XXXA Unspecified fall, initial encounter: Secondary | ICD-10-CM | POA: Diagnosis not present

## 2022-11-15 DIAGNOSIS — S199XXA Unspecified injury of neck, initial encounter: Secondary | ICD-10-CM | POA: Diagnosis not present

## 2022-11-15 DIAGNOSIS — S022XXA Fracture of nasal bones, initial encounter for closed fracture: Secondary | ICD-10-CM | POA: Diagnosis not present

## 2022-11-18 DIAGNOSIS — I480 Paroxysmal atrial fibrillation: Secondary | ICD-10-CM | POA: Diagnosis not present

## 2022-11-18 DIAGNOSIS — N1832 Chronic kidney disease, stage 3b: Secondary | ICD-10-CM | POA: Diagnosis not present

## 2022-11-18 DIAGNOSIS — F339 Major depressive disorder, recurrent, unspecified: Secondary | ICD-10-CM | POA: Diagnosis not present

## 2022-11-18 DIAGNOSIS — D692 Other nonthrombocytopenic purpura: Secondary | ICD-10-CM | POA: Diagnosis not present

## 2022-11-18 DIAGNOSIS — J449 Chronic obstructive pulmonary disease, unspecified: Secondary | ICD-10-CM | POA: Diagnosis not present

## 2022-11-18 DIAGNOSIS — I2510 Atherosclerotic heart disease of native coronary artery without angina pectoris: Secondary | ICD-10-CM | POA: Diagnosis not present

## 2022-11-18 DIAGNOSIS — I130 Hypertensive heart and chronic kidney disease with heart failure and stage 1 through stage 4 chronic kidney disease, or unspecified chronic kidney disease: Secondary | ICD-10-CM | POA: Diagnosis not present

## 2022-11-18 DIAGNOSIS — N179 Acute kidney failure, unspecified: Secondary | ICD-10-CM | POA: Diagnosis not present

## 2022-11-18 DIAGNOSIS — E039 Hypothyroidism, unspecified: Secondary | ICD-10-CM | POA: Diagnosis not present

## 2022-11-19 ENCOUNTER — Other Ambulatory Visit: Payer: Self-pay | Admitting: *Deleted

## 2022-11-19 DIAGNOSIS — E876 Hypokalemia: Secondary | ICD-10-CM | POA: Diagnosis not present

## 2022-11-19 DIAGNOSIS — Z7689 Persons encountering health services in other specified circumstances: Secondary | ICD-10-CM | POA: Diagnosis not present

## 2022-11-19 DIAGNOSIS — Z139 Encounter for screening, unspecified: Secondary | ICD-10-CM | POA: Diagnosis not present

## 2022-11-19 DIAGNOSIS — I10 Essential (primary) hypertension: Secondary | ICD-10-CM | POA: Diagnosis not present

## 2022-11-19 DIAGNOSIS — S0083XA Contusion of other part of head, initial encounter: Secondary | ICD-10-CM | POA: Diagnosis not present

## 2022-11-19 DIAGNOSIS — I350 Nonrheumatic aortic (valve) stenosis: Secondary | ICD-10-CM

## 2022-11-19 DIAGNOSIS — Z6834 Body mass index (BMI) 34.0-34.9, adult: Secondary | ICD-10-CM | POA: Diagnosis not present

## 2022-11-21 DIAGNOSIS — Z6834 Body mass index (BMI) 34.0-34.9, adult: Secondary | ICD-10-CM | POA: Diagnosis not present

## 2022-11-21 DIAGNOSIS — Z4802 Encounter for removal of sutures: Secondary | ICD-10-CM | POA: Diagnosis not present

## 2022-11-21 DIAGNOSIS — J449 Chronic obstructive pulmonary disease, unspecified: Secondary | ICD-10-CM | POA: Diagnosis not present

## 2022-11-21 DIAGNOSIS — Z139 Encounter for screening, unspecified: Secondary | ICD-10-CM | POA: Diagnosis not present

## 2022-11-21 NOTE — Addendum Note (Signed)
Addended by: Ricci Barker on: 11/21/2022 11:17 AM   Modules accepted: Orders

## 2022-11-22 DIAGNOSIS — F339 Major depressive disorder, recurrent, unspecified: Secondary | ICD-10-CM | POA: Diagnosis not present

## 2022-11-22 DIAGNOSIS — J449 Chronic obstructive pulmonary disease, unspecified: Secondary | ICD-10-CM | POA: Diagnosis not present

## 2022-11-22 DIAGNOSIS — I480 Paroxysmal atrial fibrillation: Secondary | ICD-10-CM | POA: Diagnosis not present

## 2022-11-22 DIAGNOSIS — I130 Hypertensive heart and chronic kidney disease with heart failure and stage 1 through stage 4 chronic kidney disease, or unspecified chronic kidney disease: Secondary | ICD-10-CM | POA: Diagnosis not present

## 2022-11-22 DIAGNOSIS — N179 Acute kidney failure, unspecified: Secondary | ICD-10-CM | POA: Diagnosis not present

## 2022-11-22 DIAGNOSIS — N1832 Chronic kidney disease, stage 3b: Secondary | ICD-10-CM | POA: Diagnosis not present

## 2022-11-22 DIAGNOSIS — E039 Hypothyroidism, unspecified: Secondary | ICD-10-CM | POA: Diagnosis not present

## 2022-11-22 DIAGNOSIS — I2510 Atherosclerotic heart disease of native coronary artery without angina pectoris: Secondary | ICD-10-CM | POA: Diagnosis not present

## 2022-11-22 DIAGNOSIS — D692 Other nonthrombocytopenic purpura: Secondary | ICD-10-CM | POA: Diagnosis not present

## 2022-11-26 DIAGNOSIS — N1832 Chronic kidney disease, stage 3b: Secondary | ICD-10-CM | POA: Diagnosis not present

## 2022-11-26 DIAGNOSIS — D692 Other nonthrombocytopenic purpura: Secondary | ICD-10-CM | POA: Diagnosis not present

## 2022-11-26 DIAGNOSIS — J449 Chronic obstructive pulmonary disease, unspecified: Secondary | ICD-10-CM | POA: Diagnosis not present

## 2022-11-26 DIAGNOSIS — I480 Paroxysmal atrial fibrillation: Secondary | ICD-10-CM | POA: Diagnosis not present

## 2022-11-26 DIAGNOSIS — F339 Major depressive disorder, recurrent, unspecified: Secondary | ICD-10-CM | POA: Diagnosis not present

## 2022-11-26 DIAGNOSIS — I130 Hypertensive heart and chronic kidney disease with heart failure and stage 1 through stage 4 chronic kidney disease, or unspecified chronic kidney disease: Secondary | ICD-10-CM | POA: Diagnosis not present

## 2022-11-26 DIAGNOSIS — E039 Hypothyroidism, unspecified: Secondary | ICD-10-CM | POA: Diagnosis not present

## 2022-11-26 DIAGNOSIS — I2510 Atherosclerotic heart disease of native coronary artery without angina pectoris: Secondary | ICD-10-CM | POA: Diagnosis not present

## 2022-11-26 DIAGNOSIS — N179 Acute kidney failure, unspecified: Secondary | ICD-10-CM | POA: Diagnosis not present

## 2022-11-27 DIAGNOSIS — Z6833 Body mass index (BMI) 33.0-33.9, adult: Secondary | ICD-10-CM | POA: Diagnosis not present

## 2022-11-27 DIAGNOSIS — J449 Chronic obstructive pulmonary disease, unspecified: Secondary | ICD-10-CM | POA: Diagnosis not present

## 2022-11-27 DIAGNOSIS — I130 Hypertensive heart and chronic kidney disease with heart failure and stage 1 through stage 4 chronic kidney disease, or unspecified chronic kidney disease: Secondary | ICD-10-CM | POA: Diagnosis not present

## 2022-11-27 DIAGNOSIS — Z139 Encounter for screening, unspecified: Secondary | ICD-10-CM | POA: Diagnosis not present

## 2022-11-27 DIAGNOSIS — F339 Major depressive disorder, recurrent, unspecified: Secondary | ICD-10-CM | POA: Diagnosis not present

## 2022-11-27 DIAGNOSIS — N179 Acute kidney failure, unspecified: Secondary | ICD-10-CM | POA: Diagnosis not present

## 2022-11-27 DIAGNOSIS — E039 Hypothyroidism, unspecified: Secondary | ICD-10-CM | POA: Diagnosis not present

## 2022-11-27 DIAGNOSIS — D692 Other nonthrombocytopenic purpura: Secondary | ICD-10-CM | POA: Diagnosis not present

## 2022-11-27 DIAGNOSIS — Z713 Dietary counseling and surveillance: Secondary | ICD-10-CM | POA: Diagnosis not present

## 2022-11-27 DIAGNOSIS — I480 Paroxysmal atrial fibrillation: Secondary | ICD-10-CM | POA: Diagnosis not present

## 2022-11-27 DIAGNOSIS — N1832 Chronic kidney disease, stage 3b: Secondary | ICD-10-CM | POA: Diagnosis not present

## 2022-11-27 DIAGNOSIS — I2510 Atherosclerotic heart disease of native coronary artery without angina pectoris: Secondary | ICD-10-CM | POA: Diagnosis not present

## 2022-11-29 DIAGNOSIS — F339 Major depressive disorder, recurrent, unspecified: Secondary | ICD-10-CM | POA: Diagnosis not present

## 2022-11-29 DIAGNOSIS — N1832 Chronic kidney disease, stage 3b: Secondary | ICD-10-CM | POA: Diagnosis not present

## 2022-11-29 DIAGNOSIS — I2510 Atherosclerotic heart disease of native coronary artery without angina pectoris: Secondary | ICD-10-CM | POA: Diagnosis not present

## 2022-11-29 DIAGNOSIS — E039 Hypothyroidism, unspecified: Secondary | ICD-10-CM | POA: Diagnosis not present

## 2022-11-29 DIAGNOSIS — I130 Hypertensive heart and chronic kidney disease with heart failure and stage 1 through stage 4 chronic kidney disease, or unspecified chronic kidney disease: Secondary | ICD-10-CM | POA: Diagnosis not present

## 2022-11-29 DIAGNOSIS — J449 Chronic obstructive pulmonary disease, unspecified: Secondary | ICD-10-CM | POA: Diagnosis not present

## 2022-11-29 DIAGNOSIS — I480 Paroxysmal atrial fibrillation: Secondary | ICD-10-CM | POA: Diagnosis not present

## 2022-11-29 DIAGNOSIS — D692 Other nonthrombocytopenic purpura: Secondary | ICD-10-CM | POA: Diagnosis not present

## 2022-11-29 DIAGNOSIS — N179 Acute kidney failure, unspecified: Secondary | ICD-10-CM | POA: Diagnosis not present

## 2022-12-03 DIAGNOSIS — E039 Hypothyroidism, unspecified: Secondary | ICD-10-CM | POA: Diagnosis not present

## 2022-12-03 DIAGNOSIS — I130 Hypertensive heart and chronic kidney disease with heart failure and stage 1 through stage 4 chronic kidney disease, or unspecified chronic kidney disease: Secondary | ICD-10-CM | POA: Diagnosis not present

## 2022-12-03 DIAGNOSIS — J449 Chronic obstructive pulmonary disease, unspecified: Secondary | ICD-10-CM | POA: Diagnosis not present

## 2022-12-03 DIAGNOSIS — I480 Paroxysmal atrial fibrillation: Secondary | ICD-10-CM | POA: Diagnosis not present

## 2022-12-03 DIAGNOSIS — I2510 Atherosclerotic heart disease of native coronary artery without angina pectoris: Secondary | ICD-10-CM | POA: Diagnosis not present

## 2022-12-03 DIAGNOSIS — F339 Major depressive disorder, recurrent, unspecified: Secondary | ICD-10-CM | POA: Diagnosis not present

## 2022-12-03 DIAGNOSIS — N179 Acute kidney failure, unspecified: Secondary | ICD-10-CM | POA: Diagnosis not present

## 2022-12-03 DIAGNOSIS — D692 Other nonthrombocytopenic purpura: Secondary | ICD-10-CM | POA: Diagnosis not present

## 2022-12-03 DIAGNOSIS — N1832 Chronic kidney disease, stage 3b: Secondary | ICD-10-CM | POA: Diagnosis not present

## 2022-12-05 DIAGNOSIS — I2510 Atherosclerotic heart disease of native coronary artery without angina pectoris: Secondary | ICD-10-CM | POA: Diagnosis not present

## 2022-12-05 DIAGNOSIS — I10 Essential (primary) hypertension: Secondary | ICD-10-CM | POA: Diagnosis not present

## 2022-12-05 DIAGNOSIS — I5030 Unspecified diastolic (congestive) heart failure: Secondary | ICD-10-CM | POA: Diagnosis not present

## 2022-12-05 DIAGNOSIS — I130 Hypertensive heart and chronic kidney disease with heart failure and stage 1 through stage 4 chronic kidney disease, or unspecified chronic kidney disease: Secondary | ICD-10-CM | POA: Diagnosis not present

## 2022-12-05 DIAGNOSIS — J449 Chronic obstructive pulmonary disease, unspecified: Secondary | ICD-10-CM | POA: Diagnosis not present

## 2022-12-05 DIAGNOSIS — F339 Major depressive disorder, recurrent, unspecified: Secondary | ICD-10-CM | POA: Diagnosis not present

## 2022-12-05 DIAGNOSIS — D692 Other nonthrombocytopenic purpura: Secondary | ICD-10-CM | POA: Diagnosis not present

## 2022-12-05 DIAGNOSIS — N1832 Chronic kidney disease, stage 3b: Secondary | ICD-10-CM | POA: Diagnosis not present

## 2022-12-05 DIAGNOSIS — E785 Hyperlipidemia, unspecified: Secondary | ICD-10-CM | POA: Diagnosis not present

## 2022-12-05 DIAGNOSIS — I480 Paroxysmal atrial fibrillation: Secondary | ICD-10-CM | POA: Diagnosis not present

## 2022-12-05 DIAGNOSIS — N179 Acute kidney failure, unspecified: Secondary | ICD-10-CM | POA: Diagnosis not present

## 2022-12-05 DIAGNOSIS — E039 Hypothyroidism, unspecified: Secondary | ICD-10-CM | POA: Diagnosis not present

## 2022-12-05 DIAGNOSIS — Z6833 Body mass index (BMI) 33.0-33.9, adult: Secondary | ICD-10-CM | POA: Diagnosis not present

## 2022-12-06 DIAGNOSIS — M19172 Post-traumatic osteoarthritis, left ankle and foot: Secondary | ICD-10-CM | POA: Diagnosis not present

## 2022-12-10 DIAGNOSIS — N179 Acute kidney failure, unspecified: Secondary | ICD-10-CM | POA: Diagnosis not present

## 2022-12-10 DIAGNOSIS — I130 Hypertensive heart and chronic kidney disease with heart failure and stage 1 through stage 4 chronic kidney disease, or unspecified chronic kidney disease: Secondary | ICD-10-CM | POA: Diagnosis not present

## 2022-12-10 DIAGNOSIS — E039 Hypothyroidism, unspecified: Secondary | ICD-10-CM | POA: Diagnosis not present

## 2022-12-10 DIAGNOSIS — I480 Paroxysmal atrial fibrillation: Secondary | ICD-10-CM | POA: Diagnosis not present

## 2022-12-10 DIAGNOSIS — I2510 Atherosclerotic heart disease of native coronary artery without angina pectoris: Secondary | ICD-10-CM | POA: Diagnosis not present

## 2022-12-10 DIAGNOSIS — J449 Chronic obstructive pulmonary disease, unspecified: Secondary | ICD-10-CM | POA: Diagnosis not present

## 2022-12-10 DIAGNOSIS — N1832 Chronic kidney disease, stage 3b: Secondary | ICD-10-CM | POA: Diagnosis not present

## 2022-12-10 DIAGNOSIS — F339 Major depressive disorder, recurrent, unspecified: Secondary | ICD-10-CM | POA: Diagnosis not present

## 2022-12-10 DIAGNOSIS — D692 Other nonthrombocytopenic purpura: Secondary | ICD-10-CM | POA: Diagnosis not present

## 2022-12-11 DIAGNOSIS — F339 Major depressive disorder, recurrent, unspecified: Secondary | ICD-10-CM | POA: Diagnosis not present

## 2022-12-11 DIAGNOSIS — I130 Hypertensive heart and chronic kidney disease with heart failure and stage 1 through stage 4 chronic kidney disease, or unspecified chronic kidney disease: Secondary | ICD-10-CM | POA: Diagnosis not present

## 2022-12-11 DIAGNOSIS — I2510 Atherosclerotic heart disease of native coronary artery without angina pectoris: Secondary | ICD-10-CM | POA: Diagnosis not present

## 2022-12-11 DIAGNOSIS — N179 Acute kidney failure, unspecified: Secondary | ICD-10-CM | POA: Diagnosis not present

## 2022-12-11 DIAGNOSIS — E039 Hypothyroidism, unspecified: Secondary | ICD-10-CM | POA: Diagnosis not present

## 2022-12-11 DIAGNOSIS — I480 Paroxysmal atrial fibrillation: Secondary | ICD-10-CM | POA: Diagnosis not present

## 2022-12-11 DIAGNOSIS — N1832 Chronic kidney disease, stage 3b: Secondary | ICD-10-CM | POA: Diagnosis not present

## 2022-12-11 DIAGNOSIS — D692 Other nonthrombocytopenic purpura: Secondary | ICD-10-CM | POA: Diagnosis not present

## 2022-12-11 DIAGNOSIS — J449 Chronic obstructive pulmonary disease, unspecified: Secondary | ICD-10-CM | POA: Diagnosis not present

## 2022-12-12 DIAGNOSIS — I130 Hypertensive heart and chronic kidney disease with heart failure and stage 1 through stage 4 chronic kidney disease, or unspecified chronic kidney disease: Secondary | ICD-10-CM | POA: Diagnosis not present

## 2022-12-12 DIAGNOSIS — N1832 Chronic kidney disease, stage 3b: Secondary | ICD-10-CM | POA: Diagnosis not present

## 2022-12-12 DIAGNOSIS — J449 Chronic obstructive pulmonary disease, unspecified: Secondary | ICD-10-CM | POA: Diagnosis not present

## 2022-12-12 DIAGNOSIS — D692 Other nonthrombocytopenic purpura: Secondary | ICD-10-CM | POA: Diagnosis not present

## 2022-12-12 DIAGNOSIS — I2510 Atherosclerotic heart disease of native coronary artery without angina pectoris: Secondary | ICD-10-CM | POA: Diagnosis not present

## 2022-12-12 DIAGNOSIS — N179 Acute kidney failure, unspecified: Secondary | ICD-10-CM | POA: Diagnosis not present

## 2022-12-12 DIAGNOSIS — F339 Major depressive disorder, recurrent, unspecified: Secondary | ICD-10-CM | POA: Diagnosis not present

## 2022-12-12 DIAGNOSIS — E039 Hypothyroidism, unspecified: Secondary | ICD-10-CM | POA: Diagnosis not present

## 2022-12-12 DIAGNOSIS — I480 Paroxysmal atrial fibrillation: Secondary | ICD-10-CM | POA: Diagnosis not present

## 2022-12-16 DIAGNOSIS — R531 Weakness: Secondary | ICD-10-CM | POA: Diagnosis not present

## 2022-12-16 DIAGNOSIS — I350 Nonrheumatic aortic (valve) stenosis: Secondary | ICD-10-CM | POA: Diagnosis not present

## 2022-12-16 DIAGNOSIS — K449 Diaphragmatic hernia without obstruction or gangrene: Secondary | ICD-10-CM | POA: Diagnosis not present

## 2022-12-16 DIAGNOSIS — R109 Unspecified abdominal pain: Secondary | ICD-10-CM | POA: Diagnosis not present

## 2022-12-16 DIAGNOSIS — F339 Major depressive disorder, recurrent, unspecified: Secondary | ICD-10-CM | POA: Diagnosis not present

## 2022-12-16 DIAGNOSIS — J449 Chronic obstructive pulmonary disease, unspecified: Secondary | ICD-10-CM | POA: Diagnosis not present

## 2022-12-16 DIAGNOSIS — E039 Hypothyroidism, unspecified: Secondary | ICD-10-CM | POA: Diagnosis not present

## 2022-12-16 DIAGNOSIS — E871 Hypo-osmolality and hyponatremia: Secondary | ICD-10-CM | POA: Diagnosis not present

## 2022-12-16 DIAGNOSIS — K648 Other hemorrhoids: Secondary | ICD-10-CM | POA: Diagnosis not present

## 2022-12-16 DIAGNOSIS — K625 Hemorrhage of anus and rectum: Secondary | ICD-10-CM | POA: Diagnosis not present

## 2022-12-16 DIAGNOSIS — D120 Benign neoplasm of cecum: Secondary | ICD-10-CM | POA: Diagnosis not present

## 2022-12-16 DIAGNOSIS — I2510 Atherosclerotic heart disease of native coronary artery without angina pectoris: Secondary | ICD-10-CM | POA: Diagnosis not present

## 2022-12-16 DIAGNOSIS — K5730 Diverticulosis of large intestine without perforation or abscess without bleeding: Secondary | ICD-10-CM | POA: Diagnosis not present

## 2022-12-16 DIAGNOSIS — I110 Hypertensive heart disease with heart failure: Secondary | ICD-10-CM | POA: Diagnosis not present

## 2022-12-16 DIAGNOSIS — I480 Paroxysmal atrial fibrillation: Secondary | ICD-10-CM | POA: Diagnosis not present

## 2022-12-16 DIAGNOSIS — N281 Cyst of kidney, acquired: Secondary | ICD-10-CM | POA: Diagnosis not present

## 2022-12-16 DIAGNOSIS — R55 Syncope and collapse: Secondary | ICD-10-CM | POA: Diagnosis not present

## 2022-12-16 DIAGNOSIS — I5030 Unspecified diastolic (congestive) heart failure: Secondary | ICD-10-CM | POA: Diagnosis not present

## 2022-12-16 DIAGNOSIS — N179 Acute kidney failure, unspecified: Secondary | ICD-10-CM | POA: Diagnosis not present

## 2022-12-16 DIAGNOSIS — I517 Cardiomegaly: Secondary | ICD-10-CM | POA: Diagnosis not present

## 2022-12-16 DIAGNOSIS — K921 Melena: Secondary | ICD-10-CM | POA: Diagnosis not present

## 2022-12-16 DIAGNOSIS — N1832 Chronic kidney disease, stage 3b: Secondary | ICD-10-CM | POA: Diagnosis not present

## 2022-12-16 DIAGNOSIS — D123 Benign neoplasm of transverse colon: Secondary | ICD-10-CM | POA: Diagnosis not present

## 2022-12-16 DIAGNOSIS — D500 Iron deficiency anemia secondary to blood loss (chronic): Secondary | ICD-10-CM | POA: Diagnosis not present

## 2022-12-16 DIAGNOSIS — R58 Hemorrhage, not elsewhere classified: Secondary | ICD-10-CM | POA: Diagnosis not present

## 2022-12-16 DIAGNOSIS — D649 Anemia, unspecified: Secondary | ICD-10-CM | POA: Diagnosis not present

## 2022-12-16 DIAGNOSIS — K559 Vascular disorder of intestine, unspecified: Secondary | ICD-10-CM | POA: Diagnosis not present

## 2022-12-16 DIAGNOSIS — K529 Noninfective gastroenteritis and colitis, unspecified: Secondary | ICD-10-CM | POA: Diagnosis not present

## 2022-12-16 DIAGNOSIS — N183 Chronic kidney disease, stage 3 unspecified: Secondary | ICD-10-CM | POA: Diagnosis not present

## 2022-12-16 DIAGNOSIS — D692 Other nonthrombocytopenic purpura: Secondary | ICD-10-CM | POA: Diagnosis not present

## 2022-12-16 DIAGNOSIS — K635 Polyp of colon: Secondary | ICD-10-CM | POA: Diagnosis not present

## 2022-12-16 DIAGNOSIS — R000 Tachycardia, unspecified: Secondary | ICD-10-CM | POA: Diagnosis not present

## 2022-12-16 DIAGNOSIS — I130 Hypertensive heart and chronic kidney disease with heart failure and stage 1 through stage 4 chronic kidney disease, or unspecified chronic kidney disease: Secondary | ICD-10-CM | POA: Diagnosis not present

## 2022-12-16 DIAGNOSIS — I482 Chronic atrial fibrillation, unspecified: Secondary | ICD-10-CM | POA: Diagnosis not present

## 2022-12-16 DIAGNOSIS — K6389 Other specified diseases of intestine: Secondary | ICD-10-CM | POA: Diagnosis not present

## 2022-12-16 DIAGNOSIS — Z6833 Body mass index (BMI) 33.0-33.9, adult: Secondary | ICD-10-CM | POA: Diagnosis not present

## 2022-12-16 DIAGNOSIS — I5032 Chronic diastolic (congestive) heart failure: Secondary | ICD-10-CM | POA: Diagnosis not present

## 2022-12-16 DIAGNOSIS — I509 Heart failure, unspecified: Secondary | ICD-10-CM | POA: Diagnosis not present

## 2022-12-16 DIAGNOSIS — R42 Dizziness and giddiness: Secondary | ICD-10-CM | POA: Diagnosis not present

## 2022-12-22 DIAGNOSIS — E669 Obesity, unspecified: Secondary | ICD-10-CM | POA: Diagnosis not present

## 2022-12-22 DIAGNOSIS — J449 Chronic obstructive pulmonary disease, unspecified: Secondary | ICD-10-CM | POA: Diagnosis not present

## 2022-12-23 DIAGNOSIS — I480 Paroxysmal atrial fibrillation: Secondary | ICD-10-CM | POA: Diagnosis not present

## 2022-12-23 DIAGNOSIS — Z6833 Body mass index (BMI) 33.0-33.9, adult: Secondary | ICD-10-CM | POA: Diagnosis not present

## 2022-12-23 DIAGNOSIS — K559 Vascular disorder of intestine, unspecified: Secondary | ICD-10-CM | POA: Diagnosis not present

## 2022-12-23 DIAGNOSIS — J449 Chronic obstructive pulmonary disease, unspecified: Secondary | ICD-10-CM | POA: Diagnosis not present

## 2022-12-23 DIAGNOSIS — E039 Hypothyroidism, unspecified: Secondary | ICD-10-CM | POA: Diagnosis not present

## 2022-12-23 DIAGNOSIS — I130 Hypertensive heart and chronic kidney disease with heart failure and stage 1 through stage 4 chronic kidney disease, or unspecified chronic kidney disease: Secondary | ICD-10-CM | POA: Diagnosis not present

## 2022-12-23 DIAGNOSIS — I2510 Atherosclerotic heart disease of native coronary artery without angina pectoris: Secondary | ICD-10-CM | POA: Diagnosis not present

## 2022-12-23 DIAGNOSIS — N179 Acute kidney failure, unspecified: Secondary | ICD-10-CM | POA: Diagnosis not present

## 2022-12-23 DIAGNOSIS — F339 Major depressive disorder, recurrent, unspecified: Secondary | ICD-10-CM | POA: Diagnosis not present

## 2022-12-23 DIAGNOSIS — Z7689 Persons encountering health services in other specified circumstances: Secondary | ICD-10-CM | POA: Diagnosis not present

## 2022-12-23 DIAGNOSIS — N1832 Chronic kidney disease, stage 3b: Secondary | ICD-10-CM | POA: Diagnosis not present

## 2022-12-23 DIAGNOSIS — D692 Other nonthrombocytopenic purpura: Secondary | ICD-10-CM | POA: Diagnosis not present

## 2022-12-23 DIAGNOSIS — I714 Abdominal aortic aneurysm, without rupture, unspecified: Secondary | ICD-10-CM | POA: Diagnosis not present

## 2022-12-23 DIAGNOSIS — D500 Iron deficiency anemia secondary to blood loss (chronic): Secondary | ICD-10-CM | POA: Diagnosis not present

## 2022-12-24 DIAGNOSIS — F339 Major depressive disorder, recurrent, unspecified: Secondary | ICD-10-CM | POA: Diagnosis not present

## 2022-12-24 DIAGNOSIS — N179 Acute kidney failure, unspecified: Secondary | ICD-10-CM | POA: Diagnosis not present

## 2022-12-24 DIAGNOSIS — E039 Hypothyroidism, unspecified: Secondary | ICD-10-CM | POA: Diagnosis not present

## 2022-12-24 DIAGNOSIS — I130 Hypertensive heart and chronic kidney disease with heart failure and stage 1 through stage 4 chronic kidney disease, or unspecified chronic kidney disease: Secondary | ICD-10-CM | POA: Diagnosis not present

## 2022-12-24 DIAGNOSIS — I480 Paroxysmal atrial fibrillation: Secondary | ICD-10-CM | POA: Diagnosis not present

## 2022-12-24 DIAGNOSIS — N1832 Chronic kidney disease, stage 3b: Secondary | ICD-10-CM | POA: Diagnosis not present

## 2022-12-24 DIAGNOSIS — D692 Other nonthrombocytopenic purpura: Secondary | ICD-10-CM | POA: Diagnosis not present

## 2022-12-24 DIAGNOSIS — I2510 Atherosclerotic heart disease of native coronary artery without angina pectoris: Secondary | ICD-10-CM | POA: Diagnosis not present

## 2022-12-24 DIAGNOSIS — J449 Chronic obstructive pulmonary disease, unspecified: Secondary | ICD-10-CM | POA: Diagnosis not present

## 2022-12-25 DIAGNOSIS — I480 Paroxysmal atrial fibrillation: Secondary | ICD-10-CM | POA: Diagnosis not present

## 2022-12-25 DIAGNOSIS — I130 Hypertensive heart and chronic kidney disease with heart failure and stage 1 through stage 4 chronic kidney disease, or unspecified chronic kidney disease: Secondary | ICD-10-CM | POA: Diagnosis not present

## 2022-12-25 DIAGNOSIS — F339 Major depressive disorder, recurrent, unspecified: Secondary | ICD-10-CM | POA: Diagnosis not present

## 2022-12-25 DIAGNOSIS — I2510 Atherosclerotic heart disease of native coronary artery without angina pectoris: Secondary | ICD-10-CM | POA: Diagnosis not present

## 2022-12-25 DIAGNOSIS — N179 Acute kidney failure, unspecified: Secondary | ICD-10-CM | POA: Diagnosis not present

## 2022-12-25 DIAGNOSIS — N1832 Chronic kidney disease, stage 3b: Secondary | ICD-10-CM | POA: Diagnosis not present

## 2022-12-25 DIAGNOSIS — D692 Other nonthrombocytopenic purpura: Secondary | ICD-10-CM | POA: Diagnosis not present

## 2022-12-25 DIAGNOSIS — E039 Hypothyroidism, unspecified: Secondary | ICD-10-CM | POA: Diagnosis not present

## 2022-12-25 DIAGNOSIS — J449 Chronic obstructive pulmonary disease, unspecified: Secondary | ICD-10-CM | POA: Diagnosis not present

## 2022-12-30 DIAGNOSIS — F339 Major depressive disorder, recurrent, unspecified: Secondary | ICD-10-CM | POA: Diagnosis not present

## 2022-12-30 DIAGNOSIS — N1832 Chronic kidney disease, stage 3b: Secondary | ICD-10-CM | POA: Diagnosis not present

## 2022-12-30 DIAGNOSIS — D692 Other nonthrombocytopenic purpura: Secondary | ICD-10-CM | POA: Diagnosis not present

## 2022-12-30 DIAGNOSIS — I480 Paroxysmal atrial fibrillation: Secondary | ICD-10-CM | POA: Diagnosis not present

## 2022-12-30 DIAGNOSIS — N179 Acute kidney failure, unspecified: Secondary | ICD-10-CM | POA: Diagnosis not present

## 2022-12-30 DIAGNOSIS — I130 Hypertensive heart and chronic kidney disease with heart failure and stage 1 through stage 4 chronic kidney disease, or unspecified chronic kidney disease: Secondary | ICD-10-CM | POA: Diagnosis not present

## 2022-12-30 DIAGNOSIS — J449 Chronic obstructive pulmonary disease, unspecified: Secondary | ICD-10-CM | POA: Diagnosis not present

## 2022-12-30 DIAGNOSIS — E039 Hypothyroidism, unspecified: Secondary | ICD-10-CM | POA: Diagnosis not present

## 2022-12-30 DIAGNOSIS — I2510 Atherosclerotic heart disease of native coronary artery without angina pectoris: Secondary | ICD-10-CM | POA: Diagnosis not present

## 2023-01-01 DIAGNOSIS — D692 Other nonthrombocytopenic purpura: Secondary | ICD-10-CM | POA: Diagnosis not present

## 2023-01-01 DIAGNOSIS — I2510 Atherosclerotic heart disease of native coronary artery without angina pectoris: Secondary | ICD-10-CM | POA: Diagnosis not present

## 2023-01-01 DIAGNOSIS — I130 Hypertensive heart and chronic kidney disease with heart failure and stage 1 through stage 4 chronic kidney disease, or unspecified chronic kidney disease: Secondary | ICD-10-CM | POA: Diagnosis not present

## 2023-01-01 DIAGNOSIS — Z6833 Body mass index (BMI) 33.0-33.9, adult: Secondary | ICD-10-CM | POA: Diagnosis not present

## 2023-01-01 DIAGNOSIS — N179 Acute kidney failure, unspecified: Secondary | ICD-10-CM | POA: Diagnosis not present

## 2023-01-01 DIAGNOSIS — F339 Major depressive disorder, recurrent, unspecified: Secondary | ICD-10-CM | POA: Diagnosis not present

## 2023-01-01 DIAGNOSIS — I480 Paroxysmal atrial fibrillation: Secondary | ICD-10-CM | POA: Diagnosis not present

## 2023-01-01 DIAGNOSIS — N1832 Chronic kidney disease, stage 3b: Secondary | ICD-10-CM | POA: Diagnosis not present

## 2023-01-01 DIAGNOSIS — E039 Hypothyroidism, unspecified: Secondary | ICD-10-CM | POA: Diagnosis not present

## 2023-01-01 DIAGNOSIS — R300 Dysuria: Secondary | ICD-10-CM | POA: Diagnosis not present

## 2023-01-01 DIAGNOSIS — J449 Chronic obstructive pulmonary disease, unspecified: Secondary | ICD-10-CM | POA: Diagnosis not present

## 2023-01-01 DIAGNOSIS — R319 Hematuria, unspecified: Secondary | ICD-10-CM | POA: Diagnosis not present

## 2023-01-02 DIAGNOSIS — N179 Acute kidney failure, unspecified: Secondary | ICD-10-CM | POA: Diagnosis not present

## 2023-01-02 DIAGNOSIS — D692 Other nonthrombocytopenic purpura: Secondary | ICD-10-CM | POA: Diagnosis not present

## 2023-01-02 DIAGNOSIS — I480 Paroxysmal atrial fibrillation: Secondary | ICD-10-CM | POA: Diagnosis not present

## 2023-01-02 DIAGNOSIS — N1832 Chronic kidney disease, stage 3b: Secondary | ICD-10-CM | POA: Diagnosis not present

## 2023-01-02 DIAGNOSIS — F339 Major depressive disorder, recurrent, unspecified: Secondary | ICD-10-CM | POA: Diagnosis not present

## 2023-01-02 DIAGNOSIS — J449 Chronic obstructive pulmonary disease, unspecified: Secondary | ICD-10-CM | POA: Diagnosis not present

## 2023-01-02 DIAGNOSIS — I130 Hypertensive heart and chronic kidney disease with heart failure and stage 1 through stage 4 chronic kidney disease, or unspecified chronic kidney disease: Secondary | ICD-10-CM | POA: Diagnosis not present

## 2023-01-02 DIAGNOSIS — E039 Hypothyroidism, unspecified: Secondary | ICD-10-CM | POA: Diagnosis not present

## 2023-01-02 DIAGNOSIS — I2510 Atherosclerotic heart disease of native coronary artery without angina pectoris: Secondary | ICD-10-CM | POA: Diagnosis not present

## 2023-01-06 DIAGNOSIS — F339 Major depressive disorder, recurrent, unspecified: Secondary | ICD-10-CM | POA: Diagnosis not present

## 2023-01-06 DIAGNOSIS — N179 Acute kidney failure, unspecified: Secondary | ICD-10-CM | POA: Diagnosis not present

## 2023-01-06 DIAGNOSIS — N1832 Chronic kidney disease, stage 3b: Secondary | ICD-10-CM | POA: Diagnosis not present

## 2023-01-06 DIAGNOSIS — J449 Chronic obstructive pulmonary disease, unspecified: Secondary | ICD-10-CM | POA: Diagnosis not present

## 2023-01-06 DIAGNOSIS — I130 Hypertensive heart and chronic kidney disease with heart failure and stage 1 through stage 4 chronic kidney disease, or unspecified chronic kidney disease: Secondary | ICD-10-CM | POA: Diagnosis not present

## 2023-01-06 DIAGNOSIS — I480 Paroxysmal atrial fibrillation: Secondary | ICD-10-CM | POA: Diagnosis not present

## 2023-01-06 DIAGNOSIS — E039 Hypothyroidism, unspecified: Secondary | ICD-10-CM | POA: Diagnosis not present

## 2023-01-06 DIAGNOSIS — D692 Other nonthrombocytopenic purpura: Secondary | ICD-10-CM | POA: Diagnosis not present

## 2023-01-06 DIAGNOSIS — I2510 Atherosclerotic heart disease of native coronary artery without angina pectoris: Secondary | ICD-10-CM | POA: Diagnosis not present

## 2023-01-08 DIAGNOSIS — J449 Chronic obstructive pulmonary disease, unspecified: Secondary | ICD-10-CM | POA: Diagnosis not present

## 2023-01-08 DIAGNOSIS — N1832 Chronic kidney disease, stage 3b: Secondary | ICD-10-CM | POA: Diagnosis not present

## 2023-01-08 DIAGNOSIS — I130 Hypertensive heart and chronic kidney disease with heart failure and stage 1 through stage 4 chronic kidney disease, or unspecified chronic kidney disease: Secondary | ICD-10-CM | POA: Diagnosis not present

## 2023-01-08 DIAGNOSIS — D692 Other nonthrombocytopenic purpura: Secondary | ICD-10-CM | POA: Diagnosis not present

## 2023-01-08 DIAGNOSIS — N179 Acute kidney failure, unspecified: Secondary | ICD-10-CM | POA: Diagnosis not present

## 2023-01-08 DIAGNOSIS — F339 Major depressive disorder, recurrent, unspecified: Secondary | ICD-10-CM | POA: Diagnosis not present

## 2023-01-08 DIAGNOSIS — Z6833 Body mass index (BMI) 33.0-33.9, adult: Secondary | ICD-10-CM | POA: Diagnosis not present

## 2023-01-08 DIAGNOSIS — I480 Paroxysmal atrial fibrillation: Secondary | ICD-10-CM | POA: Diagnosis not present

## 2023-01-08 DIAGNOSIS — Z713 Dietary counseling and surveillance: Secondary | ICD-10-CM | POA: Diagnosis not present

## 2023-01-08 DIAGNOSIS — I2510 Atherosclerotic heart disease of native coronary artery without angina pectoris: Secondary | ICD-10-CM | POA: Diagnosis not present

## 2023-01-08 DIAGNOSIS — E039 Hypothyroidism, unspecified: Secondary | ICD-10-CM | POA: Diagnosis not present

## 2023-01-08 DIAGNOSIS — I5030 Unspecified diastolic (congestive) heart failure: Secondary | ICD-10-CM | POA: Diagnosis not present

## 2023-01-09 DIAGNOSIS — I480 Paroxysmal atrial fibrillation: Secondary | ICD-10-CM | POA: Diagnosis not present

## 2023-01-09 DIAGNOSIS — N179 Acute kidney failure, unspecified: Secondary | ICD-10-CM | POA: Diagnosis not present

## 2023-01-09 DIAGNOSIS — D692 Other nonthrombocytopenic purpura: Secondary | ICD-10-CM | POA: Diagnosis not present

## 2023-01-09 DIAGNOSIS — N1832 Chronic kidney disease, stage 3b: Secondary | ICD-10-CM | POA: Diagnosis not present

## 2023-01-09 DIAGNOSIS — F339 Major depressive disorder, recurrent, unspecified: Secondary | ICD-10-CM | POA: Diagnosis not present

## 2023-01-09 DIAGNOSIS — J449 Chronic obstructive pulmonary disease, unspecified: Secondary | ICD-10-CM | POA: Diagnosis not present

## 2023-01-09 DIAGNOSIS — E039 Hypothyroidism, unspecified: Secondary | ICD-10-CM | POA: Diagnosis not present

## 2023-01-09 DIAGNOSIS — I130 Hypertensive heart and chronic kidney disease with heart failure and stage 1 through stage 4 chronic kidney disease, or unspecified chronic kidney disease: Secondary | ICD-10-CM | POA: Diagnosis not present

## 2023-01-09 DIAGNOSIS — I2510 Atherosclerotic heart disease of native coronary artery without angina pectoris: Secondary | ICD-10-CM | POA: Diagnosis not present

## 2023-01-13 DIAGNOSIS — I480 Paroxysmal atrial fibrillation: Secondary | ICD-10-CM | POA: Diagnosis not present

## 2023-01-13 DIAGNOSIS — I130 Hypertensive heart and chronic kidney disease with heart failure and stage 1 through stage 4 chronic kidney disease, or unspecified chronic kidney disease: Secondary | ICD-10-CM | POA: Diagnosis not present

## 2023-01-13 DIAGNOSIS — N179 Acute kidney failure, unspecified: Secondary | ICD-10-CM | POA: Diagnosis not present

## 2023-01-13 DIAGNOSIS — D692 Other nonthrombocytopenic purpura: Secondary | ICD-10-CM | POA: Diagnosis not present

## 2023-01-13 DIAGNOSIS — J449 Chronic obstructive pulmonary disease, unspecified: Secondary | ICD-10-CM | POA: Diagnosis not present

## 2023-01-13 DIAGNOSIS — F339 Major depressive disorder, recurrent, unspecified: Secondary | ICD-10-CM | POA: Diagnosis not present

## 2023-01-13 DIAGNOSIS — E039 Hypothyroidism, unspecified: Secondary | ICD-10-CM | POA: Diagnosis not present

## 2023-01-13 DIAGNOSIS — I2510 Atherosclerotic heart disease of native coronary artery without angina pectoris: Secondary | ICD-10-CM | POA: Diagnosis not present

## 2023-01-13 DIAGNOSIS — N1832 Chronic kidney disease, stage 3b: Secondary | ICD-10-CM | POA: Diagnosis not present

## 2023-01-15 DIAGNOSIS — N179 Acute kidney failure, unspecified: Secondary | ICD-10-CM | POA: Diagnosis not present

## 2023-01-15 DIAGNOSIS — J449 Chronic obstructive pulmonary disease, unspecified: Secondary | ICD-10-CM | POA: Diagnosis not present

## 2023-01-15 DIAGNOSIS — E876 Hypokalemia: Secondary | ICD-10-CM | POA: Diagnosis not present

## 2023-01-15 DIAGNOSIS — D692 Other nonthrombocytopenic purpura: Secondary | ICD-10-CM | POA: Diagnosis not present

## 2023-01-15 DIAGNOSIS — E039 Hypothyroidism, unspecified: Secondary | ICD-10-CM | POA: Diagnosis not present

## 2023-01-15 DIAGNOSIS — N1832 Chronic kidney disease, stage 3b: Secondary | ICD-10-CM | POA: Diagnosis not present

## 2023-01-15 DIAGNOSIS — F339 Major depressive disorder, recurrent, unspecified: Secondary | ICD-10-CM | POA: Diagnosis not present

## 2023-01-15 DIAGNOSIS — I480 Paroxysmal atrial fibrillation: Secondary | ICD-10-CM | POA: Diagnosis not present

## 2023-01-15 DIAGNOSIS — I130 Hypertensive heart and chronic kidney disease with heart failure and stage 1 through stage 4 chronic kidney disease, or unspecified chronic kidney disease: Secondary | ICD-10-CM | POA: Diagnosis not present

## 2023-01-15 DIAGNOSIS — I2510 Atherosclerotic heart disease of native coronary artery without angina pectoris: Secondary | ICD-10-CM | POA: Diagnosis not present

## 2023-01-16 DIAGNOSIS — Z961 Presence of intraocular lens: Secondary | ICD-10-CM | POA: Diagnosis not present

## 2023-01-16 DIAGNOSIS — H353132 Nonexudative age-related macular degeneration, bilateral, intermediate dry stage: Secondary | ICD-10-CM | POA: Diagnosis not present

## 2023-01-16 DIAGNOSIS — H35373 Puckering of macula, bilateral: Secondary | ICD-10-CM | POA: Diagnosis not present

## 2023-01-16 DIAGNOSIS — H52221 Regular astigmatism, right eye: Secondary | ICD-10-CM | POA: Diagnosis not present

## 2023-01-16 DIAGNOSIS — H524 Presbyopia: Secondary | ICD-10-CM | POA: Diagnosis not present

## 2023-01-21 DIAGNOSIS — E669 Obesity, unspecified: Secondary | ICD-10-CM | POA: Diagnosis not present

## 2023-01-21 DIAGNOSIS — J449 Chronic obstructive pulmonary disease, unspecified: Secondary | ICD-10-CM | POA: Diagnosis not present

## 2023-01-22 DIAGNOSIS — D500 Iron deficiency anemia secondary to blood loss (chronic): Secondary | ICD-10-CM | POA: Diagnosis not present

## 2023-01-22 DIAGNOSIS — I5030 Unspecified diastolic (congestive) heart failure: Secondary | ICD-10-CM | POA: Diagnosis not present

## 2023-01-22 DIAGNOSIS — Z6833 Body mass index (BMI) 33.0-33.9, adult: Secondary | ICD-10-CM | POA: Diagnosis not present

## 2023-01-22 DIAGNOSIS — Z713 Dietary counseling and surveillance: Secondary | ICD-10-CM | POA: Diagnosis not present

## 2023-01-22 DIAGNOSIS — K559 Vascular disorder of intestine, unspecified: Secondary | ICD-10-CM | POA: Diagnosis not present

## 2023-01-22 DIAGNOSIS — E6609 Other obesity due to excess calories: Secondary | ICD-10-CM | POA: Diagnosis not present

## 2023-01-27 DIAGNOSIS — I10 Essential (primary) hypertension: Secondary | ICD-10-CM | POA: Diagnosis not present

## 2023-01-27 DIAGNOSIS — M19072 Primary osteoarthritis, left ankle and foot: Secondary | ICD-10-CM | POA: Diagnosis not present

## 2023-01-27 DIAGNOSIS — Z6832 Body mass index (BMI) 32.0-32.9, adult: Secondary | ICD-10-CM | POA: Diagnosis not present

## 2023-01-27 DIAGNOSIS — E876 Hypokalemia: Secondary | ICD-10-CM | POA: Diagnosis not present

## 2023-01-30 DIAGNOSIS — K559 Vascular disorder of intestine, unspecified: Secondary | ICD-10-CM | POA: Diagnosis not present

## 2023-01-30 DIAGNOSIS — Z86010 Personal history of colonic polyps: Secondary | ICD-10-CM | POA: Diagnosis not present

## 2023-01-30 DIAGNOSIS — K219 Gastro-esophageal reflux disease without esophagitis: Secondary | ICD-10-CM | POA: Diagnosis not present

## 2023-02-10 DIAGNOSIS — I10 Essential (primary) hypertension: Secondary | ICD-10-CM | POA: Diagnosis not present

## 2023-02-10 DIAGNOSIS — M533 Sacrococcygeal disorders, not elsewhere classified: Secondary | ICD-10-CM | POA: Diagnosis not present

## 2023-02-10 DIAGNOSIS — N1832 Chronic kidney disease, stage 3b: Secondary | ICD-10-CM | POA: Diagnosis not present

## 2023-02-10 DIAGNOSIS — Z6833 Body mass index (BMI) 33.0-33.9, adult: Secondary | ICD-10-CM | POA: Diagnosis not present

## 2023-02-10 DIAGNOSIS — E039 Hypothyroidism, unspecified: Secondary | ICD-10-CM | POA: Diagnosis not present

## 2023-02-12 DIAGNOSIS — N1832 Chronic kidney disease, stage 3b: Secondary | ICD-10-CM | POA: Diagnosis not present

## 2023-02-12 DIAGNOSIS — Z6833 Body mass index (BMI) 33.0-33.9, adult: Secondary | ICD-10-CM | POA: Diagnosis not present

## 2023-02-12 DIAGNOSIS — E6609 Other obesity due to excess calories: Secondary | ICD-10-CM | POA: Diagnosis not present

## 2023-02-12 DIAGNOSIS — Z713 Dietary counseling and surveillance: Secondary | ICD-10-CM | POA: Diagnosis not present

## 2023-02-15 DIAGNOSIS — S60945A Unspecified superficial injury of left ring finger, initial encounter: Secondary | ICD-10-CM | POA: Diagnosis not present

## 2023-02-15 DIAGNOSIS — M79645 Pain in left finger(s): Secondary | ICD-10-CM | POA: Diagnosis not present

## 2023-02-18 DIAGNOSIS — S62645A Nondisplaced fracture of proximal phalanx of left ring finger, initial encounter for closed fracture: Secondary | ICD-10-CM | POA: Diagnosis not present

## 2023-02-21 DIAGNOSIS — E6609 Other obesity due to excess calories: Secondary | ICD-10-CM | POA: Diagnosis not present

## 2023-02-21 DIAGNOSIS — I10 Essential (primary) hypertension: Secondary | ICD-10-CM | POA: Diagnosis not present

## 2023-02-22 DIAGNOSIS — I10 Essential (primary) hypertension: Secondary | ICD-10-CM | POA: Diagnosis not present

## 2023-02-22 DIAGNOSIS — E6609 Other obesity due to excess calories: Secondary | ICD-10-CM | POA: Diagnosis not present

## 2023-02-25 ENCOUNTER — Ambulatory Visit: Payer: Medicare HMO | Attending: Cardiovascular Disease | Admitting: Cardiovascular Disease

## 2023-02-25 ENCOUNTER — Encounter: Payer: Self-pay | Admitting: Cardiovascular Disease

## 2023-02-25 VITALS — BP 158/56 | HR 87 | Ht 64.0 in | Wt 198.4 lb

## 2023-02-25 DIAGNOSIS — I10 Essential (primary) hypertension: Secondary | ICD-10-CM | POA: Diagnosis not present

## 2023-02-25 DIAGNOSIS — I5032 Chronic diastolic (congestive) heart failure: Secondary | ICD-10-CM

## 2023-02-25 DIAGNOSIS — I471 Supraventricular tachycardia, unspecified: Secondary | ICD-10-CM | POA: Diagnosis not present

## 2023-02-25 DIAGNOSIS — I350 Nonrheumatic aortic (valve) stenosis: Secondary | ICD-10-CM

## 2023-02-25 DIAGNOSIS — I2510 Atherosclerotic heart disease of native coronary artery without angina pectoris: Secondary | ICD-10-CM

## 2023-02-25 NOTE — Progress Notes (Signed)
Cardiology Office Note   Date:  02/25/2023   ID:  Janice Glass, DOB [birth year], MRN [id number]  PCP:  Abner Greenspan, MD  Cardiologist:   Lorine Bears, MD   No chief complaint on file.      History of Present Illness: Janice Glass is a 82 y.o. female who presents for a followup visit regarding coronary artery disease, chronic diastolic heart failure and aortic stenosis.  She has known history of coronary artery disease with previous drug-eluting stent placement to the LAD.   She has worsening dyspnea in 2020 and thus a right and left cardiac catheterization was done in September 2020 which showed widely patent proximal LAD stent with no significant restenosis.  There was stable 50% stenosis in the distal LAD and 50% stenosis in the proximal right coronary artery with significant ostial stenosis in a small second diagonal.  Right heart catheterization was normal.  There was 15 mm peak to peak gradient across the aortic valve consistent with mild aortic stenosis. No revascularization was required.  Echocardiogram in August 2022 showed normal LV systolic function, grade 1 diastolic dysfunction and stable mild aortic stenosis with mean gradient of 11 mmHg.  She had a viral illness and November, 2022 and was also diagnosed with strep throat.  She had worsening shortness of breath with that but then she developed palpitations and worsening dyspnea and was hospitalized at [facility].  She was initially thought of having atrial fibrillation but it was sinus tachycardia with PACs. She was switched from amlodipine to diltiazem.  2-week ZIO monitor showed sinus rhythm with short runs of SVT without atrial fibrillation.  I increased the dose of diltiazem to 240 mg once daily.  She had worsening heart failure symptoms in February, 2023 and thus the dose of furosemide was increased to 40 mg once daily.  An echocardiogram was repeated which showed normal LV systolic function, grade 2  diastolic dysfunction with elevated left atrial pressure and mild to moderate aortic stenosis.  She reports falling in February due to passing out at the gas station with significant trauma to the face.  She was found to be hypokalemic and dehydrated.  She was hospitalized in March with colitis.  She feels better now.  She walks with a cane. She denies chest pain or worsening dyspnea.  She had labs done last month which showed normal renal function   Past Medical History:  Diagnosis Date   Anxiety    CAD (coronary artery disease)    a. 11/2009 Abnl myoview - inferobasal ischemia;  b. 12/2009 PCI/DES to pLAD w/ 3x15 mm Promus DES;  c. Relook Cath 01/08/2010: patent stent, nonobs dzs. Relook cath 02/2012: patent LAD stent with mild disease.   Depression    GERD (gastroesophageal reflux disease)    HLD (hyperlipidemia)    Hypertensive heart disease    Hypothyroidism    Mild Carotid Arterial Disease    a. 04/2015 Carotid U/S: 1-39% bilat ICA stenosis.   Mild mitral regurgitation    a. 08/2015 Echo: Ef 55-60%, no rwma, mild MR.    Past Surgical History:  Procedure Laterality Date   CARDIAC CATHETERIZATION  12/2009   CARDIAC CATHETERIZATION N/A 06/03/2016   Procedure: Left Heart Cath and Coronary Angiography;  Surgeon: Runell Gess, MD;  Location: [facility] INVASIVE CV LAB;  Service: Cardiovascular;  Laterality: N/A;   CESAREAN SECTION     several    CORONARY ANGIOPLASTY WITH STENT PLACEMENT  12/2009   DES to  proximal LAD   CORONARY STENT PLACEMENT     LEFT HEART CATHETERIZATION WITH CORONARY ANGIOGRAM N/A 03/20/2012   Procedure: LEFT HEART CATHETERIZATION WITH CORONARY ANGIOGRAM;  Surgeon: Herby Abraham, MD;  Location: [facility] CATH LAB;  Service: Cardiovascular;  Laterality: N/A;   RIGHT/LEFT HEART CATH AND CORONARY ANGIOGRAPHY N/A 06/02/2019   Procedure: RIGHT/LEFT HEART CATH AND CORONARY ANGIOGRAPHY;  Surgeon: Iran Ouch, MD;  Location: [facility] INVASIVE CV LAB;  Service: Cardiovascular;   Laterality: N/A;     Current Outpatient Medications  Medication Sig Dispense Refill   acetaminophen (TYLENOL) 500 MG tablet Take 500-1,000 mg by mouth every 8 (eight) hours as needed for mild pain or headache.     anti-nausea (EMETROL) solution Take 10 mLs by mouth every 15 (fifteen) minutes as needed for nausea or vomiting.     aspirin 81 MG EC tablet Take 1 tablet (81 mg total) by mouth daily.     cetirizine (ZYRTEC) 10 MG tablet Take 1 tablet by mouth daily.     diltiazem (CARTIA XT) 240 MG 24 hr capsule Take 1 capsule (240 mg total) by mouth daily. 90 capsule 3   fluticasone (FLONASE) 50 MCG/ACT nasal spray Place 2 sprays into both nostrils 2 (two) times daily.      furosemide (LASIX) 40 MG tablet Take 1 tablet by mouth daily.     levothyroxine (SYNTHROID) 88 MCG tablet Take 88 mcg by mouth daily before breakfast.     losartan (COZAAR) 100 MG tablet Take 100 mg by mouth daily.     metoprolol tartrate (LOPRESSOR) 50 MG tablet Take 1 tablet (50 mg total) by mouth 2 (two) times daily. 60 tablet 6   Multiple Vitamins-Minerals (EYE VITAMINS) CAPS Take 1 capsule by mouth daily.     omeprazole (PRILOSEC) 40 MG capsule Take 40 mg by mouth 2 (two) times daily.      ondansetron (ZOFRAN) 4 MG tablet Take 4 mg by mouth every 8 (eight) hours as needed for nausea or vomiting.      potassium chloride (K-DUR) 10 MEQ tablet Take 10 mEq by mouth every evening.      promethazine (PHENERGAN) 25 MG tablet Take 1 tablet (25 mg total) by mouth every 6 (six) hours as needed for nausea or vomiting. 20 tablet 0   rosuvastatin (CRESTOR) 20 MG tablet Take 20 mg by mouth 2 (two) times a week.     traMADol (ULTRAM) 50 MG tablet Take 50 mg by mouth every 12 (twelve) hours as needed (pain).      Vitamin D, Ergocalciferol, (DRISDOL) 1.25 MG (50000 UT) CAPS capsule Take 50,000 Units by mouth every Thursday.     loratadine (CLARITIN) 10 MG tablet Take 10 mg by mouth daily. (Patient not taking: Reported on 02/25/2023)      nitroGLYCERIN (NITROSTAT) 0.4 MG SL tablet Place 1 tablet (0.4 mg total) under the tongue every 5 (five) minutes as needed. For chest pain (Patient not taking: Reported on 02/25/2023) 25 tablet 3   No current facility-administered medications for this visit.    Allergies:   Ciprofloxacin, Codeine, Gabapentin, Sulfa antibiotics, Sulfonamide derivatives, Wellbutrin [bupropion], and Zolpidem    Social History:  The patient  reports that she has never smoked. She has never used smokeless tobacco. She reports that she does not drink alcohol and does not use drugs.   Family History:  The patient's family history includes Heart failure in her mother.    ROS:  Please see the history of present illness.  Otherwise, review of systems are positive for none.   All other systems are reviewed and negative.    PHYSICAL EXAM: VS:  BP (!) 158/56 (BP Location: Left Arm, Patient Position: Sitting)   Pulse 87   Ht 5\' 4"  (1.626 m)   Wt 198 lb 6.4 oz (90 kg)   SpO2 96%   BMI 34.06 kg/m  , BMI Body mass index is 34.06 kg/m. GEN: Well nourished, well developed, in no acute distress  HEENT: normal  Neck: no JVD, carotid bruits, or masses Cardiac: RRR; no rubs, or gallops,. 2/6 systolic ejection murmur in the aortic area.  Trace bilateral leg edema Respiratory:  clear to auscultation bilaterally, normal work of breathing GI: soft, nontender, nondistended, + BS MS: no deformity or atrophy  Skin: warm and dry, no rash Neuro:  Strength and sensation are intact Psych: euthymic mood, full affect    EKG:  EKG is ordered today. EKG showed normal sinus rhythm with minimal LVH .  Possible left atrial enlargement.    Recent Labs: No results found for requested labs within last 365 days.    Lipid Panel    Component Value Date/Time   CHOL 168 06/01/2016 0333   TRIG 145 06/01/2016 0333   HDL 55 06/01/2016 0333   CHOLHDL 3.1 06/01/2016 0333   VLDL 29 06/01/2016 0333   LDLCALC 84 06/01/2016 0333       Wt Readings from Last 3 Encounters:  02/25/23 198 lb 6.4 oz (90 kg)  02/26/22 200 lb 9.6 oz (91 kg)  11/06/21 200 lb (90.7 kg)           No data to display            ASSESSMENT AND PLAN:  1.  Chronic diastolic heart failure: She appears to be euvolemic on current dose of furosemide 40 mg once daily.  Blood pressure is reasonably controlled.  Continue same medications.  She is not a good candidate for an SGLT2 inhibitor due to recurrent UTIs.  Continue  2. Coronary artery disease involving native coronary arteries without angina: Stable overall.  Continue medical therapy.   2.  Aortic stenosis: Most recent echocardiogram in February of this year showed progression to moderate aortic stenosis with mean gradient of 23 mmHg and valve area of 1.21 cm.  Repeat echocardiogram in February 2025.   3. Essential hypertension: Blood pressure is weighted but blood pressure is more controlled at home.  I am hesitant to increase antihypertensive medications given tendency for her blood pressure to run low.  She also has underlying moderate aortic stenosis.  4. Hyperlipidemia: Continue treatment with rosuvastatin .  Most recent lipid profile showed an LDL of 84.  5.  SVT and PACs: Significant improvement in symptoms with diltiazem and metoprolol.    Disposition: Follow-up with me in 6 months.  Signed,  Lorine Bears, MD  02/25/2023 11:43 AM    [facility]

## 2023-02-25 NOTE — Patient Instructions (Signed)
Medication Instructions:  No changes *If you need a refill on your cardiac medications before your next appointment, please call your pharmacy*   Lab Work: None ordered If you have labs (blood work) drawn today and your tests are completely normal, you will receive your results only by: MyChart Message (if you have MyChart) OR A paper copy in the mail If you have any lab test that is abnormal or we need to change your treatment, we will call you to review the results.   Testing/Procedures: None ordered   Follow-Up: At [facility], you and your health needs are our priority.  As part of our continuing mission to provide you with exceptional heart care, we have created designated Provider Care Teams.  These Care Teams include your primary Cardiologist (physician) and Advanced Practice Providers (APPs -  Physician Assistants and Nurse Practitioners) who all work together to provide you with the care you need, when you need it.  We recommend signing up for the patient portal called "MyChart".  Sign up information is provided on this After Visit Summary.  MyChart is used to connect with patients for Virtual Visits (Telemedicine).  Patients are able to view lab/test results, encounter notes, upcoming appointments, etc.  Non-urgent messages can be sent to your provider as well.   To learn more about what you can do with MyChart, go to [email or web address].    Your next appointment:   6 month(s)  Provider:   Muhammad Arida, MD   

## 2023-02-26 DIAGNOSIS — G4719 Other hypersomnia: Secondary | ICD-10-CM | POA: Diagnosis not present

## 2023-02-26 DIAGNOSIS — R296 Repeated falls: Secondary | ICD-10-CM | POA: Diagnosis not present

## 2023-02-26 DIAGNOSIS — Z6832 Body mass index (BMI) 32.0-32.9, adult: Secondary | ICD-10-CM | POA: Diagnosis not present

## 2023-03-06 DIAGNOSIS — S62645A Nondisplaced fracture of proximal phalanx of left ring finger, initial encounter for closed fracture: Secondary | ICD-10-CM | POA: Diagnosis not present

## 2023-03-11 DIAGNOSIS — E6609 Other obesity due to excess calories: Secondary | ICD-10-CM | POA: Diagnosis not present

## 2023-03-11 DIAGNOSIS — G4719 Other hypersomnia: Secondary | ICD-10-CM | POA: Diagnosis not present

## 2023-03-11 DIAGNOSIS — E039 Hypothyroidism, unspecified: Secondary | ICD-10-CM | POA: Diagnosis not present

## 2023-03-11 DIAGNOSIS — Z6833 Body mass index (BMI) 33.0-33.9, adult: Secondary | ICD-10-CM | POA: Diagnosis not present

## 2023-03-11 DIAGNOSIS — E785 Hyperlipidemia, unspecified: Secondary | ICD-10-CM | POA: Diagnosis not present

## 2023-03-11 DIAGNOSIS — Z713 Dietary counseling and surveillance: Secondary | ICD-10-CM | POA: Diagnosis not present

## 2023-03-18 DIAGNOSIS — N1832 Chronic kidney disease, stage 3b: Secondary | ICD-10-CM | POA: Diagnosis not present

## 2023-03-18 DIAGNOSIS — E039 Hypothyroidism, unspecified: Secondary | ICD-10-CM | POA: Diagnosis not present

## 2023-03-18 DIAGNOSIS — Z6833 Body mass index (BMI) 33.0-33.9, adult: Secondary | ICD-10-CM | POA: Diagnosis not present

## 2023-03-18 DIAGNOSIS — E785 Hyperlipidemia, unspecified: Secondary | ICD-10-CM | POA: Diagnosis not present

## 2023-03-18 DIAGNOSIS — I10 Essential (primary) hypertension: Secondary | ICD-10-CM | POA: Diagnosis not present

## 2023-03-23 DIAGNOSIS — E6609 Other obesity due to excess calories: Secondary | ICD-10-CM | POA: Diagnosis not present

## 2023-03-23 DIAGNOSIS — I10 Essential (primary) hypertension: Secondary | ICD-10-CM | POA: Diagnosis not present

## 2023-03-24 DIAGNOSIS — I10 Essential (primary) hypertension: Secondary | ICD-10-CM | POA: Diagnosis not present

## 2023-03-24 DIAGNOSIS — E6609 Other obesity due to excess calories: Secondary | ICD-10-CM | POA: Diagnosis not present

## 2023-03-28 DIAGNOSIS — S62645A Nondisplaced fracture of proximal phalanx of left ring finger, initial encounter for closed fracture: Secondary | ICD-10-CM | POA: Diagnosis not present

## 2023-04-09 DIAGNOSIS — G4734 Idiopathic sleep related nonobstructive alveolar hypoventilation: Secondary | ICD-10-CM | POA: Diagnosis not present

## 2023-04-11 DIAGNOSIS — G4730 Sleep apnea, unspecified: Secondary | ICD-10-CM | POA: Diagnosis not present

## 2023-04-23 DIAGNOSIS — I10 Essential (primary) hypertension: Secondary | ICD-10-CM | POA: Diagnosis not present

## 2023-04-23 DIAGNOSIS — E6609 Other obesity due to excess calories: Secondary | ICD-10-CM | POA: Diagnosis not present

## 2023-04-24 DIAGNOSIS — G4734 Idiopathic sleep related nonobstructive alveolar hypoventilation: Secondary | ICD-10-CM | POA: Diagnosis not present

## 2023-04-24 DIAGNOSIS — I10 Essential (primary) hypertension: Secondary | ICD-10-CM | POA: Diagnosis not present

## 2023-04-30 DIAGNOSIS — G4733 Obstructive sleep apnea (adult) (pediatric): Secondary | ICD-10-CM | POA: Diagnosis not present

## 2023-05-05 DIAGNOSIS — R197 Diarrhea, unspecified: Secondary | ICD-10-CM | POA: Diagnosis not present

## 2023-05-05 DIAGNOSIS — Z6833 Body mass index (BMI) 33.0-33.9, adult: Secondary | ICD-10-CM | POA: Diagnosis not present

## 2023-05-05 DIAGNOSIS — R7301 Impaired fasting glucose: Secondary | ICD-10-CM | POA: Diagnosis not present

## 2023-05-05 DIAGNOSIS — E039 Hypothyroidism, unspecified: Secondary | ICD-10-CM | POA: Diagnosis not present

## 2023-05-05 DIAGNOSIS — E785 Hyperlipidemia, unspecified: Secondary | ICD-10-CM | POA: Diagnosis not present

## 2023-05-05 DIAGNOSIS — N1832 Chronic kidney disease, stage 3b: Secondary | ICD-10-CM | POA: Diagnosis not present

## 2023-05-15 DIAGNOSIS — Z139 Encounter for screening, unspecified: Secondary | ICD-10-CM | POA: Diagnosis not present

## 2023-05-15 DIAGNOSIS — Z6833 Body mass index (BMI) 33.0-33.9, adult: Secondary | ICD-10-CM | POA: Diagnosis not present

## 2023-05-15 DIAGNOSIS — Z1389 Encounter for screening for other disorder: Secondary | ICD-10-CM | POA: Diagnosis not present

## 2023-05-15 DIAGNOSIS — N1832 Chronic kidney disease, stage 3b: Secondary | ICD-10-CM | POA: Diagnosis not present

## 2023-05-15 DIAGNOSIS — Z1339 Encounter for screening examination for other mental health and behavioral disorders: Secondary | ICD-10-CM | POA: Diagnosis not present

## 2023-05-15 DIAGNOSIS — Z1331 Encounter for screening for depression: Secondary | ICD-10-CM | POA: Diagnosis not present

## 2023-05-15 DIAGNOSIS — Z0000 Encounter for general adult medical examination without abnormal findings: Secondary | ICD-10-CM | POA: Diagnosis not present

## 2023-05-15 DIAGNOSIS — E785 Hyperlipidemia, unspecified: Secondary | ICD-10-CM | POA: Diagnosis not present

## 2023-05-15 DIAGNOSIS — E039 Hypothyroidism, unspecified: Secondary | ICD-10-CM | POA: Diagnosis not present

## 2023-05-25 DIAGNOSIS — E6609 Other obesity due to excess calories: Secondary | ICD-10-CM | POA: Diagnosis not present

## 2023-05-25 DIAGNOSIS — G4734 Idiopathic sleep related nonobstructive alveolar hypoventilation: Secondary | ICD-10-CM | POA: Diagnosis not present

## 2023-06-24 DIAGNOSIS — G4734 Idiopathic sleep related nonobstructive alveolar hypoventilation: Secondary | ICD-10-CM | POA: Diagnosis not present

## 2023-06-24 DIAGNOSIS — E6609 Other obesity due to excess calories: Secondary | ICD-10-CM | POA: Diagnosis not present

## 2023-06-26 DIAGNOSIS — Z6833 Body mass index (BMI) 33.0-33.9, adult: Secondary | ICD-10-CM | POA: Diagnosis not present

## 2023-06-26 DIAGNOSIS — H6121 Impacted cerumen, right ear: Secondary | ICD-10-CM | POA: Diagnosis not present

## 2023-07-01 DIAGNOSIS — H6121 Impacted cerumen, right ear: Secondary | ICD-10-CM | POA: Diagnosis not present

## 2023-07-08 DIAGNOSIS — M1711 Unilateral primary osteoarthritis, right knee: Secondary | ICD-10-CM | POA: Diagnosis not present

## 2023-07-23 DIAGNOSIS — H6121 Impacted cerumen, right ear: Secondary | ICD-10-CM | POA: Diagnosis not present

## 2023-07-23 DIAGNOSIS — Z1331 Encounter for screening for depression: Secondary | ICD-10-CM | POA: Diagnosis not present

## 2023-07-25 DIAGNOSIS — E6609 Other obesity due to excess calories: Secondary | ICD-10-CM | POA: Diagnosis not present

## 2023-07-25 DIAGNOSIS — G4734 Idiopathic sleep related nonobstructive alveolar hypoventilation: Secondary | ICD-10-CM | POA: Diagnosis not present

## 2023-08-19 DIAGNOSIS — E785 Hyperlipidemia, unspecified: Secondary | ICD-10-CM | POA: Diagnosis not present

## 2023-08-19 DIAGNOSIS — N1832 Chronic kidney disease, stage 3b: Secondary | ICD-10-CM | POA: Diagnosis not present

## 2023-08-19 DIAGNOSIS — E039 Hypothyroidism, unspecified: Secondary | ICD-10-CM | POA: Diagnosis not present

## 2023-08-24 DIAGNOSIS — E6609 Other obesity due to excess calories: Secondary | ICD-10-CM | POA: Diagnosis not present

## 2023-08-24 DIAGNOSIS — G4734 Idiopathic sleep related nonobstructive alveolar hypoventilation: Secondary | ICD-10-CM | POA: Diagnosis not present

## 2023-08-25 DIAGNOSIS — R32 Unspecified urinary incontinence: Secondary | ICD-10-CM | POA: Diagnosis not present

## 2023-08-25 DIAGNOSIS — Z6834 Body mass index (BMI) 34.0-34.9, adult: Secondary | ICD-10-CM | POA: Diagnosis not present

## 2023-08-25 DIAGNOSIS — R82998 Other abnormal findings in urine: Secondary | ICD-10-CM | POA: Diagnosis not present

## 2023-08-25 DIAGNOSIS — I10 Essential (primary) hypertension: Secondary | ICD-10-CM | POA: Diagnosis not present

## 2023-08-25 DIAGNOSIS — N1832 Chronic kidney disease, stage 3b: Secondary | ICD-10-CM | POA: Diagnosis not present

## 2023-08-25 DIAGNOSIS — E785 Hyperlipidemia, unspecified: Secondary | ICD-10-CM | POA: Diagnosis not present

## 2023-08-25 DIAGNOSIS — E039 Hypothyroidism, unspecified: Secondary | ICD-10-CM | POA: Diagnosis not present

## 2023-08-26 ENCOUNTER — Ambulatory Visit: Payer: Medicare HMO | Attending: Cardiovascular Disease | Admitting: Cardiovascular Disease

## 2023-08-26 ENCOUNTER — Encounter: Payer: Self-pay | Admitting: Cardiovascular Disease

## 2023-08-26 VITALS — BP 170/90 | HR 83 | Ht 64.0 in | Wt 203.0 lb

## 2023-08-26 DIAGNOSIS — I2510 Atherosclerotic heart disease of native coronary artery without angina pectoris: Secondary | ICD-10-CM

## 2023-08-26 DIAGNOSIS — I350 Nonrheumatic aortic (valve) stenosis: Secondary | ICD-10-CM | POA: Diagnosis not present

## 2023-08-26 DIAGNOSIS — I471 Supraventricular tachycardia, unspecified: Secondary | ICD-10-CM | POA: Diagnosis not present

## 2023-08-26 DIAGNOSIS — I10 Essential (primary) hypertension: Secondary | ICD-10-CM

## 2023-08-26 DIAGNOSIS — I5032 Chronic diastolic (congestive) heart failure: Secondary | ICD-10-CM | POA: Diagnosis not present

## 2023-08-26 DIAGNOSIS — E785 Hyperlipidemia, unspecified: Secondary | ICD-10-CM | POA: Diagnosis not present

## 2023-08-26 MED ORDER — CARVEDILOL 12.5 MG PO TABS: 12.5000 mg | ORAL_TABLET | Freq: Two times a day (BID) | ORAL | 1 refills | Status: AC

## 2023-08-26 NOTE — Progress Notes (Signed)
Cardiology Office Note   Date:  08/26/2023   ID:  CYLA HENAO, DOB May 08, 1941, MRN [id number]  PCP:  Abner Greenspan, MD  Cardiologist:   Lorine Bears, MD   No chief complaint on file.      History of Present Illness: Janice Glass is a 82 y.o. female who presents for a followup visit regarding coronary artery disease, chronic diastolic heart failure and aortic stenosis.  She has known history of coronary artery disease with previous drug-eluting stent placement to the LAD.   She had worsening dyspnea in 2020 and thus a right and left cardiac catheterization was done in September 2020 which showed widely patent proximal LAD stent with no significant restenosis.  There was stable 50% stenosis in the distal LAD and 50% stenosis in the proximal right coronary artery with significant ostial stenosis in a small second diagonal.  Right heart catheterization was normal.  There was 15 mm peak to peak gradient across the aortic valve consistent with mild aortic stenosis. No revascularization was required. She had a viral illness and November, 2022 and was also diagnosed with strep throat.  She had worsening shortness of breath with that but then she developed palpitations and worsening dyspnea and was hospitalized at [facility].  She was initially thought of having atrial fibrillation but it was sinus tachycardia with PACs. She was switched from amlodipine to diltiazem.  2-week ZIO monitor showed sinus rhythm with short runs of SVT without atrial fibrillation.    She had worsening heart failure symptoms in February, 2023 and thus the dose of furosemide was increased to 40 mg once daily.  An echocardiogram was repeated which showed normal LV systolic function, grade 2 diastolic dysfunction with elevated left atrial pressure and mild to moderate aortic stenosis.  She had a syncopal episode in February and was found to have dehydration and hypokalemia.  She was hospitalized in March  with colitis.  She has been doing reasonably well overall with no chest pain or worsening dyspnea.  She has minimal palpitations.  Her blood pressure has been elevated.  Past Medical History:  Diagnosis Date   Anxiety    CAD (coronary artery disease)    a. 11/2009 Abnl myoview - inferobasal ischemia;  b. 12/2009 PCI/DES to pLAD w/ 3x15 mm Promus DES;  c. Relook Cath 01/08/2010: patent stent, nonobs dzs. Relook cath 02/2012: patent LAD stent with mild disease.   Depression    GERD (gastroesophageal reflux disease)    HLD (hyperlipidemia)    Hypertensive heart disease    Hypothyroidism    Mild Carotid Arterial Disease    a. 04/2015 Carotid U/S: 1-39% bilat ICA stenosis.   Mild mitral regurgitation    a. 08/2015 Echo: Ef 55-60%, no rwma, mild MR.    Past Surgical History:  Procedure Laterality Date   CARDIAC CATHETERIZATION  12/2009   CARDIAC CATHETERIZATION N/A 06/03/2016   Procedure: Left Heart Cath and Coronary Angiography;  Surgeon: Runell Gess, MD;  Location: [facility] INVASIVE CV LAB;  Service: Cardiovascular;  Laterality: N/A;   CESAREAN SECTION     several    CORONARY ANGIOPLASTY WITH STENT PLACEMENT  12/2009   DES to proximal LAD   CORONARY STENT PLACEMENT     LEFT HEART CATHETERIZATION WITH CORONARY ANGIOGRAM N/A 03/20/2012   Procedure: LEFT HEART CATHETERIZATION WITH CORONARY ANGIOGRAM;  Surgeon: Herby Abraham, MD;  Location: [facility] CATH LAB;  Service: Cardiovascular;  Laterality: N/A;   RIGHT/LEFT HEART CATH AND CORONARY ANGIOGRAPHY  N/A 06/02/2019   Procedure: RIGHT/LEFT HEART CATH AND CORONARY ANGIOGRAPHY;  Surgeon: Iran Ouch, MD;  Location: [facility] INVASIVE CV LAB;  Service: Cardiovascular;  Laterality: N/A;     Current Outpatient Medications  Medication Sig Dispense Refill   acetaminophen (TYLENOL) 500 MG tablet Take 500-1,000 mg by mouth every 8 (eight) hours as needed for mild pain or headache.     anti-nausea (EMETROL) solution Take 10 mLs by mouth every 15 (fifteen)  minutes as needed for nausea or vomiting.     aspirin 81 MG EC tablet Take 1 tablet (81 mg total) by mouth daily.     carvedilol (COREG) 12.5 MG tablet Take 1 tablet (12.5 mg total) by mouth 2 (two) times daily. 180 tablet 1   diltiazem (CARTIA XT) 240 MG 24 hr capsule Take 1 capsule (240 mg total) by mouth daily. 90 capsule 3   fluticasone (FLONASE) 50 MCG/ACT nasal spray Place 2 sprays into both nostrils 2 (two) times daily.      furosemide (LASIX) 40 MG tablet Take 1 tablet by mouth daily.     levothyroxine (SYNTHROID) 100 MCG tablet Take 100 mcg by mouth daily before breakfast.     losartan (COZAAR) 100 MG tablet Take 100 mg by mouth daily.     Multiple Vitamins-Minerals (EYE VITAMINS) CAPS Take 1 capsule by mouth daily.     omeprazole (PRILOSEC) 40 MG capsule Take 40 mg by mouth 2 (two) times daily.      ondansetron (ZOFRAN) 4 MG tablet Take 4 mg by mouth every 8 (eight) hours as needed for nausea or vomiting.      potassium chloride (K-DUR) 10 MEQ tablet Take 10 mEq by mouth every evening.      promethazine (PHENERGAN) 25 MG tablet Take 1 tablet (25 mg total) by mouth every 6 (six) hours as needed for nausea or vomiting. 20 tablet 0   Vitamin D, Ergocalciferol, (DRISDOL) 1.25 MG (50000 UT) CAPS capsule Take 50,000 Units by mouth every Thursday.     cetirizine (ZYRTEC) 10 MG tablet Take 1 tablet by mouth daily.     loratadine (CLARITIN) 10 MG tablet Take 10 mg by mouth daily. (Patient not taking: Reported on 02/25/2023)     nitroGLYCERIN (NITROSTAT) 0.4 MG SL tablet Place 1 tablet (0.4 mg total) under the tongue every 5 (five) minutes as needed. For chest pain (Patient not taking: Reported on 02/25/2023) 25 tablet 3   rosuvastatin (CRESTOR) 20 MG tablet Take 20 mg by mouth 2 (two) times a week. (Patient not taking: Reported on 08/26/2023)     traMADol (ULTRAM) 50 MG tablet Take 50 mg by mouth every 12 (twelve) hours as needed (pain).  (Patient not taking: Reported on 08/26/2023)     No current  facility-administered medications for this visit.    Allergies:   Ciprofloxacin, Codeine, Gabapentin, Sulfa antibiotics, Sulfonamide derivatives, Wellbutrin [bupropion], and Zolpidem    Social History:  The patient  reports that she has never smoked. She has never used smokeless tobacco. She reports that she does not drink alcohol and does not use drugs.   Family History:  The patient's family history includes Heart failure in her mother.    ROS:  Please see the history of present illness.   Otherwise, review of systems are positive for none.   All other systems are reviewed and negative.    PHYSICAL EXAM: VS:  BP (!) 170/90 (BP Location: Left Arm, Patient Position: Sitting, Cuff Size: Normal)   Pulse 83  Ht 5\' 4"  (1.626 m)   Wt 203 lb (92.1 kg)   SpO2 96%   BMI 34.84 kg/m  , BMI Body mass index is 34.84 kg/m. GEN: Well nourished, well developed, in no acute distress  HEENT: normal  Neck: no JVD, carotid bruits, or masses Cardiac: RRR; no rubs, or gallops,. 2/6 systolic ejection murmur in the aortic area.  Trace bilateral leg edema Respiratory:  clear to auscultation bilaterally, normal work of breathing GI: soft, nontender, nondistended, + BS MS: no deformity or atrophy  Skin: warm and dry, no rash Neuro:  Strength and sensation are intact Psych: euthymic mood, full affect    EKG:  EKG is ordered today. EKG showed: Normal sinus rhythm Possible Left atrial enlargement Left ventricular hypertrophy ( R in aVL , Cornell product , Romhilt-Estes ) Inferior infarct (cited on or before 02-Jun-2019) When compared with ECG of 02-Jun-2019 11:41, Nonspecific T wave abnormality no longer evident in Anterior leads       Recent Labs: No results found for requested labs within last 365 days.    Lipid Panel    Component Value Date/Time   CHOL 168 06/01/2016 0333   TRIG 145 06/01/2016 0333   HDL 55 06/01/2016 0333   CHOLHDL 3.1 06/01/2016 0333   VLDL 29 06/01/2016 0333    LDLCALC 84 06/01/2016 0333      Wt Readings from Last 3 Encounters:  08/26/23 203 lb (92.1 kg)  02/25/23 198 lb 6.4 oz (90 kg)  02/26/22 200 lb 9.6 oz (91 kg)           No data to display            ASSESSMENT AND PLAN:  1.  Chronic diastolic heart failure: She appears to be euvolemic on current dose of furosemide 40 mg once daily.   She is not a good candidate for an SGLT2 inhibitor due to recurrent UTIs.    2. Coronary artery disease involving native coronary arteries without angina: Stable overall.  Continue medical therapy.   2.  Aortic stenosis: Most recent echocardiogram in February of this year showed progression to moderate aortic stenosis with mean gradient of 23 mmHg and valve area of 1.21 cm.  Repeat echocardiogram in February 2025.   3. Essential hypertension: Her blood pressure has been elevated.  Thus, I elected to switch metoprolol to carvedilol 12.5 mg twice daily.  Continue losartan and diltiazem.  4. Hyperlipidemia: Continue treatment with rosuvastatin .  Most recent lipid profile showed an LDL of 84.  She is on rosuvastatin twice weekly.  5.  SVT and PACs: Significant improvement in symptoms with diltiazem and beta-blocker.    Disposition: Follow-up with me in 6 months.  Signed,  Lorine Bears, MD  08/26/2023 1:04 PM    [facility]

## 2023-08-26 NOTE — Patient Instructions (Signed)
Medication Instructions:  STOP the Metoprolol  START Carvedilol 12.5 mg twice daily  *If you need a refill on your cardiac medications before your next appointment, please call your pharmacy*   Lab Work: None ordered If you have labs (blood work) drawn today and your tests are completely normal, you will receive your results only by: MyChart Message (if you have MyChart) OR A paper copy in the mail If you have any lab test that is abnormal or we need to change your treatment, we will call you to review the results.   Testing/Procedures: Your physician has requested that you have an echocardiogram in February. Echocardiography is a painless test that uses sound waves to create images of your heart. It provides your doctor with information about the size and shape of your heart and how well your heart's chambers and valves are working. You may receive an ultrasound enhancing agent through an IV if needed to better visualize your heart during the echo.This procedure takes approximately one hour. There are no restrictions for this procedure. This will take place at the [address], [address].   Please note: We ask at that you not bring children with you during ultrasound (echo/ vascular) testing. Due to room size and safety concerns, children are not allowed in the ultrasound rooms during exams. Our front office staff cannot provide observation of children in our lobby area while testing is being conducted. An adult accompanying a patient to their appointment will only be allowed in the ultrasound room at the discretion of the ultrasound technician under special circumstances. We apologize for any inconvenience.    Follow-Up: At [facility], you and your health needs are our priority.  As part of our continuing mission to provide you with exceptional heart care, we have created designated Provider Care Teams.  These Care Teams include your primary Cardiologist (physician) and  Advanced Practice Providers (APPs -  Physician Assistants and Nurse Practitioners) who all work together to provide you with the care you need, when you need it.  We recommend signing up for the patient portal called "MyChart".  Sign up information is provided on this After Visit Summary.  MyChart is used to connect with patients for Virtual Visits (Telemedicine).  Patients are able to view lab/test results, encounter notes, upcoming appointments, etc.  Non-urgent messages can be sent to your provider as well.   To learn more about what you can do with MyChart, go to [email or web address].    Your next appointment:   6 month(s)  Provider:   Lorine Bears, MD

## 2023-08-29 DIAGNOSIS — R0981 Nasal congestion: Secondary | ICD-10-CM | POA: Diagnosis not present

## 2023-08-29 DIAGNOSIS — R519 Headache, unspecified: Secondary | ICD-10-CM | POA: Diagnosis not present

## 2023-09-08 DIAGNOSIS — J069 Acute upper respiratory infection, unspecified: Secondary | ICD-10-CM | POA: Diagnosis not present

## 2023-09-08 DIAGNOSIS — R000 Tachycardia, unspecified: Secondary | ICD-10-CM | POA: Diagnosis not present

## 2023-09-08 DIAGNOSIS — I10 Essential (primary) hypertension: Secondary | ICD-10-CM | POA: Diagnosis not present

## 2023-09-08 DIAGNOSIS — E66811 Obesity, class 1: Secondary | ICD-10-CM | POA: Diagnosis not present

## 2023-09-23 DIAGNOSIS — I10 Essential (primary) hypertension: Secondary | ICD-10-CM | POA: Diagnosis not present

## 2023-09-23 DIAGNOSIS — E6609 Other obesity due to excess calories: Secondary | ICD-10-CM | POA: Diagnosis not present

## 2023-09-24 DIAGNOSIS — E6609 Other obesity due to excess calories: Secondary | ICD-10-CM | POA: Diagnosis not present

## 2023-09-24 DIAGNOSIS — G4734 Idiopathic sleep related nonobstructive alveolar hypoventilation: Secondary | ICD-10-CM | POA: Diagnosis not present

## 2023-10-09 DIAGNOSIS — M545 Low back pain, unspecified: Secondary | ICD-10-CM | POA: Diagnosis not present

## 2023-10-24 DIAGNOSIS — E6609 Other obesity due to excess calories: Secondary | ICD-10-CM | POA: Diagnosis not present

## 2023-10-24 DIAGNOSIS — I10 Essential (primary) hypertension: Secondary | ICD-10-CM | POA: Diagnosis not present

## 2023-10-25 DIAGNOSIS — E6609 Other obesity due to excess calories: Secondary | ICD-10-CM | POA: Diagnosis not present

## 2023-10-25 DIAGNOSIS — G4734 Idiopathic sleep related nonobstructive alveolar hypoventilation: Secondary | ICD-10-CM | POA: Diagnosis not present

## 2023-10-28 DIAGNOSIS — M19072 Primary osteoarthritis, left ankle and foot: Secondary | ICD-10-CM | POA: Diagnosis not present

## 2023-10-28 DIAGNOSIS — S99912A Unspecified injury of left ankle, initial encounter: Secondary | ICD-10-CM | POA: Diagnosis not present

## 2023-10-31 ENCOUNTER — Other Ambulatory Visit: Payer: Self-pay | Admitting: *Deleted

## 2023-10-31 DIAGNOSIS — I359 Nonrheumatic aortic valve disorder, unspecified: Secondary | ICD-10-CM

## 2023-11-11 DIAGNOSIS — N1831 Chronic kidney disease, stage 3a: Secondary | ICD-10-CM | POA: Diagnosis not present

## 2023-11-11 DIAGNOSIS — M19172 Post-traumatic osteoarthritis, left ankle and foot: Secondary | ICD-10-CM | POA: Diagnosis not present

## 2023-11-21 DIAGNOSIS — I10 Essential (primary) hypertension: Secondary | ICD-10-CM | POA: Diagnosis not present

## 2023-11-21 DIAGNOSIS — E6609 Other obesity due to excess calories: Secondary | ICD-10-CM | POA: Diagnosis not present

## 2023-11-22 DIAGNOSIS — E6609 Other obesity due to excess calories: Secondary | ICD-10-CM | POA: Diagnosis not present

## 2023-11-22 DIAGNOSIS — G4734 Idiopathic sleep related nonobstructive alveolar hypoventilation: Secondary | ICD-10-CM | POA: Diagnosis not present

## 2023-11-25 ENCOUNTER — Ambulatory Visit (HOSPITAL_COMMUNITY): Payer: Medicare HMO | Attending: Cardiology

## 2023-11-25 DIAGNOSIS — I359 Nonrheumatic aortic valve disorder, unspecified: Secondary | ICD-10-CM | POA: Diagnosis not present

## 2023-11-25 LAB — ECHOCARDIOGRAM COMPLETE
AR max vel: 1.06 cm2
AV Area VTI: 1.14 cm2
AV Area mean vel: 1.3 cm2
AV Mean grad: 17 mmHg
AV Peak grad: 36.7 mmHg
Ao pk vel: 3.03 m/s
Area-P 1/2: 5.75 cm2
Est EF: >75
MV M vel: 5.9 m/s
MV Peak grad: 139.2 mmHg
S' Lateral: 2.6 cm

## 2023-12-04 DIAGNOSIS — I10 Essential (primary) hypertension: Secondary | ICD-10-CM | POA: Diagnosis not present

## 2023-12-13 DIAGNOSIS — R079 Chest pain, unspecified: Secondary | ICD-10-CM | POA: Diagnosis not present

## 2023-12-13 DIAGNOSIS — R0789 Other chest pain: Secondary | ICD-10-CM | POA: Diagnosis not present

## 2023-12-13 DIAGNOSIS — I517 Cardiomegaly: Secondary | ICD-10-CM | POA: Diagnosis not present

## 2023-12-13 DIAGNOSIS — R0781 Pleurodynia: Secondary | ICD-10-CM | POA: Diagnosis not present

## 2023-12-13 DIAGNOSIS — N281 Cyst of kidney, acquired: Secondary | ICD-10-CM | POA: Diagnosis not present

## 2023-12-13 DIAGNOSIS — W19XXXA Unspecified fall, initial encounter: Secondary | ICD-10-CM | POA: Diagnosis not present

## 2023-12-13 DIAGNOSIS — M25562 Pain in left knee: Secondary | ICD-10-CM | POA: Diagnosis not present

## 2023-12-13 DIAGNOSIS — M7989 Other specified soft tissue disorders: Secondary | ICD-10-CM | POA: Diagnosis not present

## 2023-12-13 DIAGNOSIS — S8002XA Contusion of left knee, initial encounter: Secondary | ICD-10-CM | POA: Diagnosis not present

## 2023-12-13 DIAGNOSIS — T50904A Poisoning by unspecified drugs, medicaments and biological substances, undetermined, initial encounter: Secondary | ICD-10-CM | POA: Diagnosis not present

## 2023-12-18 DIAGNOSIS — Z9181 History of falling: Secondary | ICD-10-CM | POA: Diagnosis not present

## 2023-12-18 DIAGNOSIS — Z7689 Persons encountering health services in other specified circumstances: Secondary | ICD-10-CM | POA: Diagnosis not present

## 2023-12-18 DIAGNOSIS — Z139 Encounter for screening, unspecified: Secondary | ICD-10-CM | POA: Diagnosis not present

## 2023-12-18 DIAGNOSIS — S2001XA Contusion of right breast, initial encounter: Secondary | ICD-10-CM | POA: Diagnosis not present

## 2023-12-18 DIAGNOSIS — S8012XS Contusion of left lower leg, sequela: Secondary | ICD-10-CM | POA: Diagnosis not present

## 2023-12-22 DIAGNOSIS — I10 Essential (primary) hypertension: Secondary | ICD-10-CM | POA: Diagnosis not present

## 2023-12-23 DIAGNOSIS — I10 Essential (primary) hypertension: Secondary | ICD-10-CM | POA: Diagnosis not present

## 2023-12-23 DIAGNOSIS — J449 Chronic obstructive pulmonary disease, unspecified: Secondary | ICD-10-CM | POA: Diagnosis not present

## 2023-12-26 DIAGNOSIS — I5030 Unspecified diastolic (congestive) heart failure: Secondary | ICD-10-CM | POA: Diagnosis not present

## 2023-12-29 DIAGNOSIS — Z6835 Body mass index (BMI) 35.0-35.9, adult: Secondary | ICD-10-CM | POA: Diagnosis not present

## 2023-12-29 DIAGNOSIS — S2001XA Contusion of right breast, initial encounter: Secondary | ICD-10-CM | POA: Diagnosis not present

## 2023-12-29 DIAGNOSIS — S8012XS Contusion of left lower leg, sequela: Secondary | ICD-10-CM | POA: Diagnosis not present

## 2023-12-29 DIAGNOSIS — R609 Edema, unspecified: Secondary | ICD-10-CM | POA: Diagnosis not present

## 2024-01-05 DIAGNOSIS — S51819A Laceration without foreign body of unspecified forearm, initial encounter: Secondary | ICD-10-CM | POA: Diagnosis not present

## 2024-01-05 DIAGNOSIS — Z6835 Body mass index (BMI) 35.0-35.9, adult: Secondary | ICD-10-CM | POA: Diagnosis not present

## 2024-01-05 DIAGNOSIS — R609 Edema, unspecified: Secondary | ICD-10-CM | POA: Diagnosis not present

## 2024-01-05 DIAGNOSIS — I5030 Unspecified diastolic (congestive) heart failure: Secondary | ICD-10-CM | POA: Diagnosis not present

## 2024-01-05 DIAGNOSIS — S81812A Laceration without foreign body, left lower leg, initial encounter: Secondary | ICD-10-CM | POA: Diagnosis not present

## 2024-01-08 DIAGNOSIS — Z961 Presence of intraocular lens: Secondary | ICD-10-CM | POA: Diagnosis not present

## 2024-01-08 DIAGNOSIS — H353132 Nonexudative age-related macular degeneration, bilateral, intermediate dry stage: Secondary | ICD-10-CM | POA: Diagnosis not present

## 2024-01-08 DIAGNOSIS — H524 Presbyopia: Secondary | ICD-10-CM | POA: Diagnosis not present

## 2024-01-08 DIAGNOSIS — H35373 Puckering of macula, bilateral: Secondary | ICD-10-CM | POA: Diagnosis not present

## 2024-01-08 DIAGNOSIS — H52221 Regular astigmatism, right eye: Secondary | ICD-10-CM | POA: Diagnosis not present

## 2024-01-09 DIAGNOSIS — Z6836 Body mass index (BMI) 36.0-36.9, adult: Secondary | ICD-10-CM | POA: Diagnosis not present

## 2024-01-09 DIAGNOSIS — E8770 Fluid overload, unspecified: Secondary | ICD-10-CM | POA: Diagnosis not present

## 2024-01-09 DIAGNOSIS — I5030 Unspecified diastolic (congestive) heart failure: Secondary | ICD-10-CM | POA: Diagnosis not present

## 2024-01-10 DIAGNOSIS — R000 Tachycardia, unspecified: Secondary | ICD-10-CM | POA: Diagnosis not present

## 2024-01-10 DIAGNOSIS — N390 Urinary tract infection, site not specified: Secondary | ICD-10-CM | POA: Diagnosis not present

## 2024-01-10 DIAGNOSIS — R531 Weakness: Secondary | ICD-10-CM | POA: Diagnosis not present

## 2024-01-10 DIAGNOSIS — R42 Dizziness and giddiness: Secondary | ICD-10-CM | POA: Diagnosis not present

## 2024-01-10 DIAGNOSIS — I2699 Other pulmonary embolism without acute cor pulmonale: Secondary | ICD-10-CM | POA: Diagnosis not present

## 2024-01-10 DIAGNOSIS — J189 Pneumonia, unspecified organism: Secondary | ICD-10-CM | POA: Diagnosis not present

## 2024-01-10 DIAGNOSIS — Z043 Encounter for examination and observation following other accident: Secondary | ICD-10-CM | POA: Diagnosis not present

## 2024-01-10 DIAGNOSIS — M25462 Effusion, left knee: Secondary | ICD-10-CM | POA: Diagnosis not present

## 2024-01-10 DIAGNOSIS — E871 Hypo-osmolality and hyponatremia: Secondary | ICD-10-CM | POA: Diagnosis not present

## 2024-01-10 DIAGNOSIS — I10 Essential (primary) hypertension: Secondary | ICD-10-CM | POA: Diagnosis not present

## 2024-01-11 DIAGNOSIS — F32A Depression, unspecified: Secondary | ICD-10-CM | POA: Diagnosis not present

## 2024-01-11 DIAGNOSIS — J189 Pneumonia, unspecified organism: Secondary | ICD-10-CM | POA: Diagnosis not present

## 2024-01-11 DIAGNOSIS — R000 Tachycardia, unspecified: Secondary | ICD-10-CM | POA: Diagnosis not present

## 2024-01-11 DIAGNOSIS — E7800 Pure hypercholesterolemia, unspecified: Secondary | ICD-10-CM | POA: Diagnosis not present

## 2024-01-11 DIAGNOSIS — I350 Nonrheumatic aortic (valve) stenosis: Secondary | ICD-10-CM | POA: Diagnosis not present

## 2024-01-11 DIAGNOSIS — I480 Paroxysmal atrial fibrillation: Secondary | ICD-10-CM | POA: Diagnosis not present

## 2024-01-11 DIAGNOSIS — J440 Chronic obstructive pulmonary disease with acute lower respiratory infection: Secondary | ICD-10-CM | POA: Diagnosis not present

## 2024-01-11 DIAGNOSIS — R55 Syncope and collapse: Secondary | ICD-10-CM | POA: Diagnosis not present

## 2024-01-11 DIAGNOSIS — Z888 Allergy status to other drugs, medicaments and biological substances status: Secondary | ICD-10-CM | POA: Diagnosis not present

## 2024-01-11 DIAGNOSIS — I2510 Atherosclerotic heart disease of native coronary artery without angina pectoris: Secondary | ICD-10-CM | POA: Diagnosis not present

## 2024-01-11 DIAGNOSIS — R42 Dizziness and giddiness: Secondary | ICD-10-CM | POA: Diagnosis not present

## 2024-01-11 DIAGNOSIS — E860 Dehydration: Secondary | ICD-10-CM | POA: Diagnosis not present

## 2024-01-11 DIAGNOSIS — N390 Urinary tract infection, site not specified: Secondary | ICD-10-CM | POA: Diagnosis not present

## 2024-01-11 DIAGNOSIS — Z955 Presence of coronary angioplasty implant and graft: Secondary | ICD-10-CM | POA: Diagnosis not present

## 2024-01-11 DIAGNOSIS — M25551 Pain in right hip: Secondary | ICD-10-CM | POA: Diagnosis not present

## 2024-01-11 DIAGNOSIS — Z882 Allergy status to sulfonamides status: Secondary | ICD-10-CM | POA: Diagnosis not present

## 2024-01-11 DIAGNOSIS — Z7982 Long term (current) use of aspirin: Secondary | ICD-10-CM | POA: Diagnosis not present

## 2024-01-11 DIAGNOSIS — E871 Hypo-osmolality and hyponatremia: Secondary | ICD-10-CM | POA: Diagnosis not present

## 2024-01-11 DIAGNOSIS — F419 Anxiety disorder, unspecified: Secondary | ICD-10-CM | POA: Diagnosis not present

## 2024-01-11 DIAGNOSIS — M25462 Effusion, left knee: Secondary | ICD-10-CM | POA: Diagnosis not present

## 2024-01-11 DIAGNOSIS — I110 Hypertensive heart disease with heart failure: Secondary | ICD-10-CM | POA: Diagnosis not present

## 2024-01-11 DIAGNOSIS — Z79899 Other long term (current) drug therapy: Secondary | ICD-10-CM | POA: Diagnosis not present

## 2024-01-11 DIAGNOSIS — I5032 Chronic diastolic (congestive) heart failure: Secondary | ICD-10-CM | POA: Diagnosis not present

## 2024-01-11 DIAGNOSIS — I2699 Other pulmonary embolism without acute cor pulmonale: Secondary | ICD-10-CM | POA: Diagnosis not present

## 2024-01-11 DIAGNOSIS — N179 Acute kidney failure, unspecified: Secondary | ICD-10-CM | POA: Diagnosis not present

## 2024-01-11 DIAGNOSIS — Z885 Allergy status to narcotic agent status: Secondary | ICD-10-CM | POA: Diagnosis not present

## 2024-01-11 DIAGNOSIS — B952 Enterococcus as the cause of diseases classified elsewhere: Secondary | ICD-10-CM | POA: Diagnosis not present

## 2024-01-11 DIAGNOSIS — K219 Gastro-esophageal reflux disease without esophagitis: Secondary | ICD-10-CM | POA: Diagnosis not present

## 2024-01-11 DIAGNOSIS — E039 Hypothyroidism, unspecified: Secondary | ICD-10-CM | POA: Diagnosis not present

## 2024-01-11 DIAGNOSIS — Z87440 Personal history of urinary (tract) infections: Secondary | ICD-10-CM | POA: Diagnosis not present

## 2024-01-11 DIAGNOSIS — M1990 Unspecified osteoarthritis, unspecified site: Secondary | ICD-10-CM | POA: Diagnosis not present

## 2024-01-11 DIAGNOSIS — Z043 Encounter for examination and observation following other accident: Secondary | ICD-10-CM | POA: Diagnosis not present

## 2024-01-12 DIAGNOSIS — M25551 Pain in right hip: Secondary | ICD-10-CM | POA: Diagnosis not present

## 2024-01-14 DIAGNOSIS — K219 Gastro-esophageal reflux disease without esophagitis: Secondary | ICD-10-CM | POA: Diagnosis not present

## 2024-01-14 DIAGNOSIS — N390 Urinary tract infection, site not specified: Secondary | ICD-10-CM | POA: Diagnosis not present

## 2024-01-14 DIAGNOSIS — I714 Abdominal aortic aneurysm, without rupture, unspecified: Secondary | ICD-10-CM | POA: Diagnosis not present

## 2024-01-14 DIAGNOSIS — N179 Acute kidney failure, unspecified: Secondary | ICD-10-CM | POA: Diagnosis not present

## 2024-01-14 DIAGNOSIS — F419 Anxiety disorder, unspecified: Secondary | ICD-10-CM | POA: Diagnosis not present

## 2024-01-14 DIAGNOSIS — D692 Other nonthrombocytopenic purpura: Secondary | ICD-10-CM | POA: Diagnosis not present

## 2024-01-14 DIAGNOSIS — E871 Hypo-osmolality and hyponatremia: Secondary | ICD-10-CM | POA: Diagnosis not present

## 2024-01-14 DIAGNOSIS — I5032 Chronic diastolic (congestive) heart failure: Secondary | ICD-10-CM | POA: Diagnosis not present

## 2024-01-14 DIAGNOSIS — I130 Hypertensive heart and chronic kidney disease with heart failure and stage 1 through stage 4 chronic kidney disease, or unspecified chronic kidney disease: Secondary | ICD-10-CM | POA: Diagnosis not present

## 2024-01-14 DIAGNOSIS — G4733 Obstructive sleep apnea (adult) (pediatric): Secondary | ICD-10-CM | POA: Diagnosis not present

## 2024-01-14 DIAGNOSIS — B952 Enterococcus as the cause of diseases classified elsewhere: Secondary | ICD-10-CM | POA: Diagnosis not present

## 2024-01-14 DIAGNOSIS — I480 Paroxysmal atrial fibrillation: Secondary | ICD-10-CM | POA: Diagnosis not present

## 2024-01-14 DIAGNOSIS — E860 Dehydration: Secondary | ICD-10-CM | POA: Diagnosis not present

## 2024-01-14 DIAGNOSIS — J440 Chronic obstructive pulmonary disease with acute lower respiratory infection: Secondary | ICD-10-CM | POA: Diagnosis not present

## 2024-01-14 DIAGNOSIS — J188 Other pneumonia, unspecified organism: Secondary | ICD-10-CM | POA: Diagnosis not present

## 2024-01-14 DIAGNOSIS — N1832 Chronic kidney disease, stage 3b: Secondary | ICD-10-CM | POA: Diagnosis not present

## 2024-01-14 DIAGNOSIS — K5730 Diverticulosis of large intestine without perforation or abscess without bleeding: Secondary | ICD-10-CM | POA: Diagnosis not present

## 2024-01-14 DIAGNOSIS — F339 Major depressive disorder, recurrent, unspecified: Secondary | ICD-10-CM | POA: Diagnosis not present

## 2024-01-14 DIAGNOSIS — E039 Hypothyroidism, unspecified: Secondary | ICD-10-CM | POA: Diagnosis not present

## 2024-01-14 DIAGNOSIS — M159 Polyosteoarthritis, unspecified: Secondary | ICD-10-CM | POA: Diagnosis not present

## 2024-01-14 DIAGNOSIS — I2510 Atherosclerotic heart disease of native coronary artery without angina pectoris: Secondary | ICD-10-CM | POA: Diagnosis not present

## 2024-01-14 DIAGNOSIS — S61411D Laceration without foreign body of right hand, subsequent encounter: Secondary | ICD-10-CM | POA: Diagnosis not present

## 2024-01-14 DIAGNOSIS — E7800 Pure hypercholesterolemia, unspecified: Secondary | ICD-10-CM | POA: Diagnosis not present

## 2024-01-15 DIAGNOSIS — I5030 Unspecified diastolic (congestive) heart failure: Secondary | ICD-10-CM | POA: Diagnosis not present

## 2024-01-15 DIAGNOSIS — N1832 Chronic kidney disease, stage 3b: Secondary | ICD-10-CM | POA: Diagnosis not present

## 2024-01-15 DIAGNOSIS — Z6835 Body mass index (BMI) 35.0-35.9, adult: Secondary | ICD-10-CM | POA: Diagnosis not present

## 2024-01-15 DIAGNOSIS — Z7689 Persons encountering health services in other specified circumstances: Secondary | ICD-10-CM | POA: Diagnosis not present

## 2024-01-15 DIAGNOSIS — E559 Vitamin D deficiency, unspecified: Secondary | ICD-10-CM | POA: Diagnosis not present

## 2024-01-15 DIAGNOSIS — I714 Abdominal aortic aneurysm, without rupture, unspecified: Secondary | ICD-10-CM | POA: Diagnosis not present

## 2024-01-20 DIAGNOSIS — I5030 Unspecified diastolic (congestive) heart failure: Secondary | ICD-10-CM | POA: Diagnosis not present

## 2024-01-20 DIAGNOSIS — E871 Hypo-osmolality and hyponatremia: Secondary | ICD-10-CM | POA: Diagnosis not present

## 2024-01-20 DIAGNOSIS — Z6835 Body mass index (BMI) 35.0-35.9, adult: Secondary | ICD-10-CM | POA: Diagnosis not present

## 2024-01-21 DIAGNOSIS — I10 Essential (primary) hypertension: Secondary | ICD-10-CM | POA: Diagnosis not present

## 2024-01-22 DIAGNOSIS — J449 Chronic obstructive pulmonary disease, unspecified: Secondary | ICD-10-CM | POA: Diagnosis not present

## 2024-01-22 DIAGNOSIS — I10 Essential (primary) hypertension: Secondary | ICD-10-CM | POA: Diagnosis not present

## 2024-01-23 DIAGNOSIS — Z6836 Body mass index (BMI) 36.0-36.9, adult: Secondary | ICD-10-CM | POA: Diagnosis not present

## 2024-01-23 DIAGNOSIS — I509 Heart failure, unspecified: Secondary | ICD-10-CM | POA: Diagnosis not present

## 2024-01-26 DIAGNOSIS — Z6835 Body mass index (BMI) 35.0-35.9, adult: Secondary | ICD-10-CM | POA: Diagnosis not present

## 2024-01-26 DIAGNOSIS — I480 Paroxysmal atrial fibrillation: Secondary | ICD-10-CM | POA: Diagnosis not present

## 2024-01-26 DIAGNOSIS — I509 Heart failure, unspecified: Secondary | ICD-10-CM | POA: Diagnosis not present

## 2024-01-29 DIAGNOSIS — I25119 Atherosclerotic heart disease of native coronary artery with unspecified angina pectoris: Secondary | ICD-10-CM | POA: Diagnosis not present

## 2024-01-29 DIAGNOSIS — Z6835 Body mass index (BMI) 35.0-35.9, adult: Secondary | ICD-10-CM | POA: Diagnosis not present

## 2024-01-29 DIAGNOSIS — I959 Hypotension, unspecified: Secondary | ICD-10-CM | POA: Diagnosis not present

## 2024-01-29 DIAGNOSIS — I5030 Unspecified diastolic (congestive) heart failure: Secondary | ICD-10-CM | POA: Diagnosis not present

## 2024-01-29 DIAGNOSIS — E871 Hypo-osmolality and hyponatremia: Secondary | ICD-10-CM | POA: Diagnosis not present

## 2024-02-02 DIAGNOSIS — I5030 Unspecified diastolic (congestive) heart failure: Secondary | ICD-10-CM | POA: Diagnosis not present

## 2024-02-02 DIAGNOSIS — E871 Hypo-osmolality and hyponatremia: Secondary | ICD-10-CM | POA: Diagnosis not present

## 2024-02-02 DIAGNOSIS — Z6835 Body mass index (BMI) 35.0-35.9, adult: Secondary | ICD-10-CM | POA: Diagnosis not present

## 2024-02-02 DIAGNOSIS — J309 Allergic rhinitis, unspecified: Secondary | ICD-10-CM | POA: Diagnosis not present

## 2024-02-03 ENCOUNTER — Telehealth: Payer: Self-pay | Admitting: Cardiovascular Disease

## 2024-02-03 NOTE — Telephone Encounter (Signed)
 PCP office called in stating Dr. Arlena Lacrosse would like for Dr. Alvenia Aus to call her at his earliest convenience. She stated she didn't say what it was in regards to.   Phone: [phone]

## 2024-02-04 NOTE — Telephone Encounter (Signed)
 I spoke with Dr. Arlena Lacrosse.  The patient has been having issues with heart failure and hyponatremia.  We discussed switching losartan  to Entresto and possibly adding spironolactone if needed.  Please schedule her to see me in the next 2 to 3 weeks.

## 2024-02-05 DIAGNOSIS — Z6835 Body mass index (BMI) 35.0-35.9, adult: Secondary | ICD-10-CM | POA: Diagnosis not present

## 2024-02-05 DIAGNOSIS — R1032 Left lower quadrant pain: Secondary | ICD-10-CM | POA: Diagnosis not present

## 2024-02-09 DIAGNOSIS — I509 Heart failure, unspecified: Secondary | ICD-10-CM | POA: Diagnosis not present

## 2024-02-09 DIAGNOSIS — E871 Hypo-osmolality and hyponatremia: Secondary | ICD-10-CM | POA: Diagnosis not present

## 2024-02-09 DIAGNOSIS — M25552 Pain in left hip: Secondary | ICD-10-CM | POA: Diagnosis not present

## 2024-02-09 DIAGNOSIS — Z6835 Body mass index (BMI) 35.0-35.9, adult: Secondary | ICD-10-CM | POA: Diagnosis not present

## 2024-02-13 DIAGNOSIS — I509 Heart failure, unspecified: Secondary | ICD-10-CM | POA: Diagnosis not present

## 2024-02-17 ENCOUNTER — Ambulatory Visit: Admitting: Cardiovascular Disease

## 2024-02-17 NOTE — Progress Notes (Deleted)
 Cardiology Office Note   Date:  02/17/2024   ID:  KEERSTIN BJELLAND, DOB [birth year], MRN [id number]  PCP:  Lonie Roa, MD  Cardiologist:   Antionette Kirks, MD   No chief complaint on file.      History of Present Illness: Janice Glass is a 83 y.o. female who presents for a followup visit regarding coronary artery disease, chronic diastolic heart failure and aortic stenosis.  She has known history of coronary artery disease with previous drug-eluting stent placement to the LAD.   She had worsening dyspnea in 2020 and thus a right and left cardiac catheterization was done in September 2020 which showed widely patent proximal LAD stent with no significant restenosis.  There was stable 50% stenosis in the distal LAD and 50% stenosis in the proximal right coronary artery with significant ostial stenosis in a small second diagonal.  Right heart catheterization was normal.  There was 15 mm peak to peak gradient across the aortic valve consistent with mild aortic stenosis. No revascularization was required. She had a viral illness and November, 2022 and was also diagnosed with strep throat.  She had worsening shortness of breath with that but then she developed palpitations and worsening dyspnea and was hospitalized at [facility].  She was initially thought of having atrial fibrillation but it was sinus tachycardia with PACs. She was switched from amlodipine  to diltiazem .  2-week ZIO monitor showed sinus rhythm with short runs of SVT without atrial fibrillation.    She had worsening heart failure symptoms in February, 2023 and thus the dose of furosemide was increased to 40 mg once daily.  An echocardiogram was repeated which showed normal LV systolic function, grade 2 diastolic dysfunction with elevated left atrial pressure and mild to moderate aortic stenosis.  She had a syncopal episode in February and was found to have dehydration and hypokalemia.  She was hospitalized in March  with colitis.  She has been doing reasonably well overall with no chest pain or worsening dyspnea.  She has minimal palpitations.  Her blood pressure has been elevated.  Past Medical History:  Diagnosis Date   Anxiety    CAD (coronary artery disease)    a. 11/2009 Abnl myoview  - inferobasal ischemia;  b. 12/2009 PCI/DES to pLAD w/ 3x15 mm Promus DES;  c. Relook Cath 01/08/2010: patent stent, nonobs dzs. Relook cath 02/2012: patent LAD stent with mild disease.   Depression    GERD (gastroesophageal reflux disease)    HLD (hyperlipidemia)    Hypertensive heart disease    Hypothyroidism    Mild Carotid Arterial Disease    a. 04/2015 Carotid U/S: 1-39% bilat ICA stenosis.   Mild mitral regurgitation    a. 08/2015 Echo: Ef 55-60%, no rwma, mild MR.    Past Surgical History:  Procedure Laterality Date   CARDIAC CATHETERIZATION  12/2009   CARDIAC CATHETERIZATION N/A 06/03/2016   Procedure: Left Heart Cath and Coronary Angiography;  Surgeon: Avanell Leigh, MD;  Location: [facility] INVASIVE CV LAB;  Service: Cardiovascular;  Laterality: N/A;   CESAREAN SECTION     several    CORONARY ANGIOPLASTY WITH STENT PLACEMENT  12/2009   DES to proximal LAD   CORONARY STENT PLACEMENT     LEFT HEART CATHETERIZATION WITH CORONARY ANGIOGRAM N/A 03/20/2012   Procedure: LEFT HEART CATHETERIZATION WITH CORONARY ANGIOGRAM;  Surgeon: Kristopher Pheasant, MD;  Location: [facility] CATH LAB;  Service: Cardiovascular;  Laterality: N/A;   RIGHT/LEFT HEART CATH AND CORONARY ANGIOGRAPHY  N/A 06/02/2019   Procedure: RIGHT/LEFT HEART CATH AND CORONARY ANGIOGRAPHY;  Surgeon: Wenona Hamilton, MD;  Location: [facility] INVASIVE CV LAB;  Service: Cardiovascular;  Laterality: N/A;     Current Outpatient Medications  Medication Sig Dispense Refill   acetaminophen  (TYLENOL ) 500 MG tablet Take 500-1,000 mg by mouth every 8 (eight) hours as needed for mild pain or headache.     anti-nausea (EMETROL) solution Take 10 mLs by mouth every 15 (fifteen)  minutes as needed for nausea or vomiting.     aspirin  81 MG EC tablet Take 1 tablet (81 mg total) by mouth daily.     carvedilol  (COREG ) 12.5 MG tablet Take 1 tablet (12.5 mg total) by mouth 2 (two) times daily. 180 tablet 1   cetirizine (ZYRTEC) 10 MG tablet Take 1 tablet by mouth daily.     diltiazem  (CARTIA  XT) 240 MG 24 hr capsule Take 1 capsule (240 mg total) by mouth daily. 90 capsule 3   fluticasone (FLONASE) 50 MCG/ACT nasal spray Place 2 sprays into both nostrils 2 (two) times daily.      furosemide (LASIX) 40 MG tablet Take 1 tablet by mouth daily.     levothyroxine  (SYNTHROID ) 100 MCG tablet Take 100 mcg by mouth daily before breakfast.     loratadine (CLARITIN) 10 MG tablet Take 10 mg by mouth daily. (Patient not taking: Reported on 02/25/2023)     losartan  (COZAAR ) 100 MG tablet Take 100 mg by mouth daily.     Multiple Vitamins-Minerals (EYE VITAMINS) CAPS Take 1 capsule by mouth daily.     nitroGLYCERIN  (NITROSTAT ) 0.4 MG SL tablet Place 1 tablet (0.4 mg total) under the tongue every 5 (five) minutes as needed. For chest pain (Patient not taking: Reported on 02/25/2023) 25 tablet 3   omeprazole (PRILOSEC) 40 MG capsule Take 40 mg by mouth 2 (two) times daily.      ondansetron  (ZOFRAN ) 4 MG tablet Take 4 mg by mouth every 8 (eight) hours as needed for nausea or vomiting.      potassium chloride  (K-DUR) 10 MEQ tablet Take 10 mEq by mouth every evening.      promethazine  (PHENERGAN ) 25 MG tablet Take 1 tablet (25 mg total) by mouth every 6 (six) hours as needed for nausea or vomiting. 20 tablet 0   rosuvastatin (CRESTOR) 20 MG tablet Take 20 mg by mouth 2 (two) times a week. (Patient not taking: Reported on 08/26/2023)     traMADol (ULTRAM) 50 MG tablet Take 50 mg by mouth every 12 (twelve) hours as needed (pain).  (Patient not taking: Reported on 08/26/2023)     Vitamin D, Ergocalciferol, (DRISDOL) 1.25 MG (50000 UT) CAPS capsule Take 50,000 Units by mouth every Thursday.     No current  facility-administered medications for this visit.    Allergies:   Ciprofloxacin, Codeine, Gabapentin, Sulfa antibiotics, Sulfonamide derivatives, Wellbutrin [bupropion], and Zolpidem    Social History:  The patient  reports that she has never smoked. She has never used smokeless tobacco. She reports that she does not drink alcohol  and does not use drugs.   Family History:  The patient's family history includes Heart failure in her mother.    ROS:  Please see the history of present illness.   Otherwise, review of systems are positive for none.   All other systems are reviewed and negative.    PHYSICAL EXAM: VS:  There were no vitals taken for this visit. , BMI There is no height or weight on  file to calculate BMI. GEN: Well nourished, well developed, in no acute distress  HEENT: normal  Neck: no JVD, carotid bruits, or masses Cardiac: RRR; no rubs, or gallops,. 2/6 systolic ejection murmur in the aortic area.  Trace bilateral leg edema Respiratory:  clear to auscultation bilaterally, normal work of breathing GI: soft, nontender, nondistended, + BS MS: no deformity or atrophy  Skin: warm and dry, no rash Neuro:  Strength and sensation are intact Psych: euthymic mood, full affect    EKG:  EKG is ordered today. EKG showed: Normal sinus rhythm Possible Left atrial enlargement Left ventricular hypertrophy ( R in aVL , Cornell product , Romhilt-Estes ) Inferior infarct (cited on or before 02-Jun-2019) When compared with ECG of 02-Jun-2019 11:41, Nonspecific T wave abnormality no longer evident in Anterior leads       Recent Labs: No results found for requested labs within last 365 days.    Lipid Panel    Component Value Date/Time   CHOL 168 06/01/2016 0333   TRIG 145 06/01/2016 0333   HDL 55 06/01/2016 0333   CHOLHDL 3.1 06/01/2016 0333   VLDL 29 06/01/2016 0333   LDLCALC 84 06/01/2016 0333      Wt Readings from Last 3 Encounters:  08/26/23 203 lb (92.1 kg)   02/25/23 198 lb 6.4 oz (90 kg)  02/26/22 200 lb 9.6 oz (91 kg)           No data to display            ASSESSMENT AND PLAN:  1.  Chronic diastolic heart failure: She appears to be euvolemic on current dose of furosemide 40 mg once daily.   She is not a good candidate for an SGLT2 inhibitor due to recurrent UTIs.    2. Coronary artery disease involving native coronary arteries without angina: Stable overall.  Continue medical therapy.   2.  Aortic stenosis: Most recent echocardiogram in February of this year showed progression to moderate aortic stenosis with mean gradient of 23 mmHg and valve area of 1.21 cm.  Repeat echocardiogram in February 2025.   3. Essential hypertension: Her blood pressure has been elevated.  Thus, I elected to switch metoprolol  to carvedilol  12.5 mg twice daily.  Continue losartan  and diltiazem .  4. Hyperlipidemia: Continue treatment with rosuvastatin .  Most recent lipid profile showed an LDL of 84.  She is on rosuvastatin twice weekly.  5.  SVT and PACs: Significant improvement in symptoms with diltiazem  and beta-blocker.    Disposition: Follow-up with me in 6 months.  Signed,  Antionette Kirks, MD  02/17/2024 2:17 PM    [facility]

## 2024-02-19 DIAGNOSIS — I509 Heart failure, unspecified: Secondary | ICD-10-CM | POA: Diagnosis not present

## 2024-02-19 DIAGNOSIS — Z6834 Body mass index (BMI) 34.0-34.9, adult: Secondary | ICD-10-CM | POA: Diagnosis not present

## 2024-02-20 DIAGNOSIS — G4733 Obstructive sleep apnea (adult) (pediatric): Secondary | ICD-10-CM | POA: Diagnosis not present

## 2024-02-21 DIAGNOSIS — I10 Essential (primary) hypertension: Secondary | ICD-10-CM | POA: Diagnosis not present

## 2024-02-22 DIAGNOSIS — I10 Essential (primary) hypertension: Secondary | ICD-10-CM | POA: Diagnosis not present

## 2024-02-22 DIAGNOSIS — J449 Chronic obstructive pulmonary disease, unspecified: Secondary | ICD-10-CM | POA: Diagnosis not present

## 2024-02-24 ENCOUNTER — Encounter: Payer: Self-pay | Admitting: Cardiovascular Disease

## 2024-02-24 ENCOUNTER — Ambulatory Visit: Attending: Cardiovascular Disease | Admitting: Cardiovascular Disease

## 2024-02-24 VITALS — BP 130/68 | HR 97 | Ht 64.0 in | Wt 200.8 lb

## 2024-02-24 DIAGNOSIS — I10 Essential (primary) hypertension: Secondary | ICD-10-CM

## 2024-02-24 DIAGNOSIS — I471 Supraventricular tachycardia, unspecified: Secondary | ICD-10-CM

## 2024-02-24 DIAGNOSIS — E785 Hyperlipidemia, unspecified: Secondary | ICD-10-CM

## 2024-02-24 DIAGNOSIS — I5032 Chronic diastolic (congestive) heart failure: Secondary | ICD-10-CM

## 2024-02-24 DIAGNOSIS — I2510 Atherosclerotic heart disease of native coronary artery without angina pectoris: Secondary | ICD-10-CM | POA: Diagnosis not present

## 2024-02-24 DIAGNOSIS — I350 Nonrheumatic aortic (valve) stenosis: Secondary | ICD-10-CM | POA: Diagnosis not present

## 2024-02-24 NOTE — Patient Instructions (Signed)
 Medication Instructions:  No changes *If you need a refill on your cardiac medications before your next appointment, please call your pharmacy*  Lab Work: None ordered If you have labs (blood work) drawn today and your tests are completely normal, you will receive your results only by: MyChart Message (if you have MyChart) OR A paper copy in the mail If you have any lab test that is abnormal or we need to change your treatment, we will call you to review the results.  Testing/Procedures: None ordered  Follow-Up: At [facility], you and your health needs are our priority.  As part of our continuing mission to provide you with exceptional heart care, our providers are all part of one team.  This team includes your primary Cardiologist (physician) and Advanced Practice Providers or APPs (Physician Assistants and Nurse Practitioners) who all work together to provide you with the care you need, when you need it.  Your next appointment:   6 month(s)  Provider:   Antionette Kirks, MD    We recommend signing up for the patient portal called "MyChart".  Sign up information is provided on this After Visit Summary.  MyChart is used to connect with patients for Virtual Visits (Telemedicine).  Patients are able to view lab/test results, encounter notes, upcoming appointments, etc.  Non-urgent messages can be sent to your provider as well.   To learn more about what you can do with MyChart, go to [email or web address].

## 2024-02-24 NOTE — Progress Notes (Signed)
 Cardiology Office Note   Date:  02/24/2024   ID:  Janice Glass, DOB [birth year], MRN [id number]  PCP:  Lonie Roa, MD  Cardiologist:   Antionette Kirks, MD   No chief complaint on file.      History of Present Illness: Janice Glass is a 83 y.o. female who presents for a followup visit regarding coronary artery disease, chronic diastolic heart failure and aortic stenosis.  She has known history of coronary artery disease with previous drug-eluting stent placement to the LAD.   She had worsening dyspnea in 2020 and thus a right and left cardiac catheterization was done in September 2020 which showed widely patent proximal LAD stent with no significant restenosis.  There was stable 50% stenosis in the distal LAD and 50% stenosis in the proximal right coronary artery with significant ostial stenosis in a small second diagonal.  Right heart catheterization was normal.  There was 15 mm peak to peak gradient across the aortic valve consistent with mild aortic stenosis. No revascularization was required. She had a viral illness and November, 2022 and was also diagnosed with strep throat.  She had worsening shortness of breath with that but then she developed palpitations and worsening dyspnea and was hospitalized at [facility].  She was initially thought of having atrial fibrillation but it was sinus tachycardia with PACs. She was switched from amlodipine  to diltiazem .  2-week ZIO monitor showed sinus rhythm with short runs of SVT without atrial fibrillation.    She had worsening heart failure symptoms in February, 2023 and thus the dose of furosemide was increased to 40 mg once daily.  An echocardiogram was repeated which showed normal LV systolic function, grade 2 diastolic dysfunction with elevated left atrial pressure and mild to moderate aortic stenosis.  She had a syncopal episode in February, 2024 and was found to have dehydration and hypokalemia.  She was hospitalized in  March, 2024 with colitis.  She had recent issues with acute on chronic diastolic heart failure complicated by hyponatremia.  Her blood pressure was also uncontrolled.  She was switched from losartan  to Entresto with significant improvement in volume overload as well as blood pressure.  She feels significantly better and close to baseline.  No chest pain.  Past Medical History:  Diagnosis Date   Anxiety    CAD (coronary artery disease)    a. 11/2009 Abnl myoview  - inferobasal ischemia;  b. 12/2009 PCI/DES to pLAD w/ 3x15 mm Promus DES;  c. Relook Cath 01/08/2010: patent stent, nonobs dzs. Relook cath 02/2012: patent LAD stent with mild disease.   Depression    GERD (gastroesophageal reflux disease)    HLD (hyperlipidemia)    Hypertensive heart disease    Hypothyroidism    Mild Carotid Arterial Disease    a. 04/2015 Carotid U/S: 1-39% bilat ICA stenosis.   Mild mitral regurgitation    a. 08/2015 Echo: Ef 55-60%, no rwma, mild MR.    Past Surgical History:  Procedure Laterality Date   CARDIAC CATHETERIZATION  12/2009   CARDIAC CATHETERIZATION N/A 06/03/2016   Procedure: Left Heart Cath and Coronary Angiography;  Surgeon: Avanell Leigh, MD;  Location: [facility] INVASIVE CV LAB;  Service: Cardiovascular;  Laterality: N/A;   CESAREAN SECTION     several    CORONARY ANGIOPLASTY WITH STENT PLACEMENT  12/2009   DES to proximal LAD   CORONARY STENT PLACEMENT     LEFT HEART CATHETERIZATION WITH CORONARY ANGIOGRAM N/A 03/20/2012   Procedure: LEFT HEART  CATHETERIZATION WITH CORONARY ANGIOGRAM;  Surgeon: Kristopher Pheasant, MD;  Location: [facility] CATH LAB;  Service: Cardiovascular;  Laterality: N/A;   RIGHT/LEFT HEART CATH AND CORONARY ANGIOGRAPHY N/A 06/02/2019   Procedure: RIGHT/LEFT HEART CATH AND CORONARY ANGIOGRAPHY;  Surgeon: Wenona Hamilton, MD;  Location: [facility] INVASIVE CV LAB;  Service: Cardiovascular;  Laterality: N/A;     Current Outpatient Medications  Medication Sig Dispense Refill   acetaminophen   (TYLENOL ) 500 MG tablet Take 500-1,000 mg by mouth every 8 (eight) hours as needed for mild pain or headache.     anti-nausea (EMETROL) solution Take 10 mLs by mouth every 15 (fifteen) minutes as needed for nausea or vomiting.     aspirin  81 MG EC tablet Take 1 tablet (81 mg total) by mouth daily.     diltiazem  (CARTIA  XT) 240 MG 24 hr capsule Take 1 capsule (240 mg total) by mouth daily. 90 capsule 3   ENTRESTO 24-26 MG Take 1 tablet by mouth 2 (two) times daily.     fluticasone (FLONASE) 50 MCG/ACT nasal spray Place 2 sprays into both nostrils 2 (two) times daily.      furosemide (LASIX) 40 MG tablet Take 1 tablet by mouth daily.     levothyroxine  (SYNTHROID ) 100 MCG tablet Take 100 mcg by mouth daily before breakfast.     losartan  (COZAAR ) 100 MG tablet Take 100 mg by mouth daily.     Multiple Vitamins-Minerals (EYE VITAMINS) CAPS Take 1 capsule by mouth daily.     omeprazole (PRILOSEC) 40 MG capsule Take 40 mg by mouth 2 (two) times daily.      ondansetron  (ZOFRAN ) 4 MG tablet Take 4 mg by mouth every 8 (eight) hours as needed for nausea or vomiting.      potassium chloride  (K-DUR) 10 MEQ tablet Take 10 mEq by mouth every evening.      promethazine  (PHENERGAN ) 25 MG tablet Take 1 tablet (25 mg total) by mouth every 6 (six) hours as needed for nausea or vomiting. 20 tablet 0   rosuvastatin (CRESTOR) 20 MG tablet Take 20 mg by mouth 2 (two) times a week.     traMADol (ULTRAM) 50 MG tablet Take 50 mg by mouth every 12 (twelve) hours as needed (pain).     Vitamin D, Ergocalciferol, (DRISDOL) 1.25 MG (50000 UT) CAPS capsule Take 50,000 Units by mouth every Thursday.     carvedilol  (COREG ) 12.5 MG tablet Take 1 tablet (12.5 mg total) by mouth 2 (two) times daily. 180 tablet 1   cetirizine (ZYRTEC) 10 MG tablet Take 1 tablet by mouth daily.     loratadine (CLARITIN) 10 MG tablet Take 10 mg by mouth daily. (Patient not taking: Reported on 02/25/2023)     nitroGLYCERIN  (NITROSTAT ) 0.4 MG SL tablet Place  1 tablet (0.4 mg total) under the tongue every 5 (five) minutes as needed. For chest pain (Patient not taking: Reported on 02/25/2023) 25 tablet 3   No current facility-administered medications for this visit.    Allergies:   Ciprofloxacin, Codeine, Gabapentin, Sulfa antibiotics, Sulfonamide derivatives, Wellbutrin [bupropion], and Zolpidem    Social History:  The patient  reports that she has never smoked. She has never used smokeless tobacco. She reports that she does not drink alcohol  and does not use drugs.   Family History:  The patient's family history includes Heart failure in her mother.    ROS:  Please see the history of present illness.   Otherwise, review of systems are positive for none.  All other systems are reviewed and negative.    PHYSICAL EXAM: VS:  BP 130/68 (BP Location: Left Arm, Patient Position: Sitting)   Pulse 97   Ht 5\' 4"  (1.626 m)   Wt 200 lb 12.8 oz (91.1 kg)   SpO2 98%   BMI 34.47 kg/m  , BMI Body mass index is 34.47 kg/m. GEN: Well nourished, well developed, in no acute distress  HEENT: normal  Neck: no JVD, carotid bruits, or masses Cardiac: RRR; no rubs, or gallops,. 2/6 systolic ejection murmur in the aortic area.  Trace bilateral leg edema Respiratory:  clear to auscultation bilaterally, normal work of breathing GI: soft, nontender, nondistended, + BS MS: no deformity or atrophy  Skin: warm and dry, no rash Neuro:  Strength and sensation are intact Psych: euthymic mood, full affect    EKG:  EKG is ordered today. EKG showed: Normal sinus rhythm Possible Left atrial enlargement Left ventricular hypertrophy ( R in aVL , Cornell product , Romhilt-Estes ) Inferior infarct (cited on or before 02-Jun-2019) When compared with ECG of 02-Jun-2019 11:41, Nonspecific T wave abnormality no longer evident in Anterior leads       Recent Labs: No results found for requested labs within last 365 days.    Lipid Panel    Component Value Date/Time    CHOL 168 06/01/2016 0333   TRIG 145 06/01/2016 0333   HDL 55 06/01/2016 0333   CHOLHDL 3.1 06/01/2016 0333   VLDL 29 06/01/2016 0333   LDLCALC 84 06/01/2016 0333      Wt Readings from Last 3 Encounters:  02/24/24 200 lb 12.8 oz (91.1 kg)  08/26/23 203 lb (92.1 kg)  02/25/23 198 lb 6.4 oz (90 kg)           No data to display            ASSESSMENT AND PLAN:  1.  Chronic diastolic heart failure: Recent issues of volume overload and hyponatremia in the setting of uncontrolled hypertension.  Significant improvement since switching losartan  to Entresto.  I favor not to decrease carvedilol  or diltiazem  for now given high resting heart rate and known frequent PACs and short runs of SVT.  Given that her blood pressure is controlled today, I elected not to increase Entresto but that can be considered if blood pressure increases again.   She is not a good candidate for an SGLT2 inhibitor due to recurrent UTIs.  Spironolactone can also be considered if additional blood pressure control is needed but for now, we have room to go up on Entresto.  2. Coronary artery disease involving native coronary arteries without angina: Stable overall.  Continue medical therapy.   2.  Aortic stenosis: This was moderate on most recent echocardiogram in March.  Will plan on repeat echocardiogram in March 2026.   3. Essential hypertension: Blood pressure is now well-controlled on current medications.  4. Hyperlipidemia: Continue treatment with rosuvastatin .  Most recent lipid profile showed an LDL of 84.  She is on rosuvastatin twice weekly.  5.  SVT and PACs: No convincing evidence of atrial fibrillation so far even though this was reported.  Continue diltiazem  and carvedilol .    Disposition: Follow-up with me in 6 months.  She follows up with Dr. Arlena Lacrosse on a regular basis  Signed,  Antionette Kirks, MD  02/24/2024 10:58 AM    [facility]

## 2024-02-25 DIAGNOSIS — I5030 Unspecified diastolic (congestive) heart failure: Secondary | ICD-10-CM | POA: Diagnosis not present

## 2024-02-25 DIAGNOSIS — Z79899 Other long term (current) drug therapy: Secondary | ICD-10-CM | POA: Diagnosis not present

## 2024-03-06 DIAGNOSIS — R42 Dizziness and giddiness: Secondary | ICD-10-CM | POA: Diagnosis not present

## 2024-03-06 DIAGNOSIS — I959 Hypotension, unspecified: Secondary | ICD-10-CM | POA: Diagnosis not present

## 2024-03-07 DIAGNOSIS — D509 Iron deficiency anemia, unspecified: Secondary | ICD-10-CM | POA: Diagnosis not present

## 2024-03-07 DIAGNOSIS — Z882 Allergy status to sulfonamides status: Secondary | ICD-10-CM | POA: Diagnosis not present

## 2024-03-07 DIAGNOSIS — I2510 Atherosclerotic heart disease of native coronary artery without angina pectoris: Secondary | ICD-10-CM | POA: Diagnosis not present

## 2024-03-07 DIAGNOSIS — E871 Hypo-osmolality and hyponatremia: Secondary | ICD-10-CM | POA: Diagnosis not present

## 2024-03-07 DIAGNOSIS — R9431 Abnormal electrocardiogram [ECG] [EKG]: Secondary | ICD-10-CM | POA: Diagnosis not present

## 2024-03-07 DIAGNOSIS — Z6834 Body mass index (BMI) 34.0-34.9, adult: Secondary | ICD-10-CM | POA: Diagnosis not present

## 2024-03-07 DIAGNOSIS — F32A Depression, unspecified: Secondary | ICD-10-CM | POA: Diagnosis not present

## 2024-03-07 DIAGNOSIS — J449 Chronic obstructive pulmonary disease, unspecified: Secondary | ICD-10-CM | POA: Diagnosis not present

## 2024-03-07 DIAGNOSIS — R531 Weakness: Secondary | ICD-10-CM | POA: Diagnosis not present

## 2024-03-07 DIAGNOSIS — I951 Orthostatic hypotension: Secondary | ICD-10-CM | POA: Diagnosis not present

## 2024-03-07 DIAGNOSIS — R739 Hyperglycemia, unspecified: Secondary | ICD-10-CM | POA: Diagnosis not present

## 2024-03-07 DIAGNOSIS — Z79899 Other long term (current) drug therapy: Secondary | ICD-10-CM | POA: Diagnosis not present

## 2024-03-07 DIAGNOSIS — I110 Hypertensive heart disease with heart failure: Secondary | ICD-10-CM | POA: Diagnosis not present

## 2024-03-07 DIAGNOSIS — M1990 Unspecified osteoarthritis, unspecified site: Secondary | ICD-10-CM | POA: Diagnosis not present

## 2024-03-07 DIAGNOSIS — F419 Anxiety disorder, unspecified: Secondary | ICD-10-CM | POA: Diagnosis not present

## 2024-03-07 DIAGNOSIS — N179 Acute kidney failure, unspecified: Secondary | ICD-10-CM | POA: Diagnosis not present

## 2024-03-07 DIAGNOSIS — D519 Vitamin B12 deficiency anemia, unspecified: Secondary | ICD-10-CM | POA: Diagnosis not present

## 2024-03-07 DIAGNOSIS — Z885 Allergy status to narcotic agent status: Secondary | ICD-10-CM | POA: Diagnosis not present

## 2024-03-07 DIAGNOSIS — I480 Paroxysmal atrial fibrillation: Secondary | ICD-10-CM | POA: Diagnosis not present

## 2024-03-07 DIAGNOSIS — I5032 Chronic diastolic (congestive) heart failure: Secondary | ICD-10-CM | POA: Diagnosis not present

## 2024-03-07 DIAGNOSIS — Z955 Presence of coronary angioplasty implant and graft: Secondary | ICD-10-CM | POA: Diagnosis not present

## 2024-03-07 DIAGNOSIS — R634 Abnormal weight loss: Secondary | ICD-10-CM | POA: Diagnosis not present

## 2024-03-07 DIAGNOSIS — Z87440 Personal history of urinary (tract) infections: Secondary | ICD-10-CM | POA: Diagnosis not present

## 2024-03-07 DIAGNOSIS — E66811 Obesity, class 1: Secondary | ICD-10-CM | POA: Diagnosis not present

## 2024-03-07 DIAGNOSIS — E039 Hypothyroidism, unspecified: Secondary | ICD-10-CM | POA: Diagnosis not present

## 2024-03-07 DIAGNOSIS — E785 Hyperlipidemia, unspecified: Secondary | ICD-10-CM | POA: Diagnosis not present

## 2024-03-07 DIAGNOSIS — Z7982 Long term (current) use of aspirin: Secondary | ICD-10-CM | POA: Diagnosis not present

## 2024-03-10 DIAGNOSIS — E039 Hypothyroidism, unspecified: Secondary | ICD-10-CM | POA: Diagnosis not present

## 2024-03-10 DIAGNOSIS — D509 Iron deficiency anemia, unspecified: Secondary | ICD-10-CM | POA: Diagnosis not present

## 2024-03-10 DIAGNOSIS — I509 Heart failure, unspecified: Secondary | ICD-10-CM | POA: Diagnosis not present

## 2024-03-10 DIAGNOSIS — Z789 Other specified health status: Secondary | ICD-10-CM | POA: Diagnosis not present

## 2024-03-10 DIAGNOSIS — E781 Pure hyperglyceridemia: Secondary | ICD-10-CM | POA: Diagnosis not present

## 2024-03-10 DIAGNOSIS — E871 Hypo-osmolality and hyponatremia: Secondary | ICD-10-CM | POA: Diagnosis not present

## 2024-03-10 DIAGNOSIS — Z6834 Body mass index (BMI) 34.0-34.9, adult: Secondary | ICD-10-CM | POA: Diagnosis not present

## 2024-03-12 DIAGNOSIS — N179 Acute kidney failure, unspecified: Secondary | ICD-10-CM | POA: Diagnosis not present

## 2024-03-12 DIAGNOSIS — E860 Dehydration: Secondary | ICD-10-CM | POA: Diagnosis not present

## 2024-03-12 DIAGNOSIS — I2510 Atherosclerotic heart disease of native coronary artery without angina pectoris: Secondary | ICD-10-CM | POA: Diagnosis not present

## 2024-03-12 DIAGNOSIS — J449 Chronic obstructive pulmonary disease, unspecified: Secondary | ICD-10-CM | POA: Diagnosis not present

## 2024-03-12 DIAGNOSIS — K219 Gastro-esophageal reflux disease without esophagitis: Secondary | ICD-10-CM | POA: Diagnosis not present

## 2024-03-12 DIAGNOSIS — F339 Major depressive disorder, recurrent, unspecified: Secondary | ICD-10-CM | POA: Diagnosis not present

## 2024-03-12 DIAGNOSIS — I714 Abdominal aortic aneurysm, without rupture, unspecified: Secondary | ICD-10-CM | POA: Diagnosis not present

## 2024-03-12 DIAGNOSIS — I5032 Chronic diastolic (congestive) heart failure: Secondary | ICD-10-CM | POA: Diagnosis not present

## 2024-03-12 DIAGNOSIS — E039 Hypothyroidism, unspecified: Secondary | ICD-10-CM | POA: Diagnosis not present

## 2024-03-12 DIAGNOSIS — G4733 Obstructive sleep apnea (adult) (pediatric): Secondary | ICD-10-CM | POA: Diagnosis not present

## 2024-03-12 DIAGNOSIS — I480 Paroxysmal atrial fibrillation: Secondary | ICD-10-CM | POA: Diagnosis not present

## 2024-03-12 DIAGNOSIS — E7800 Pure hypercholesterolemia, unspecified: Secondary | ICD-10-CM | POA: Diagnosis not present

## 2024-03-12 DIAGNOSIS — F419 Anxiety disorder, unspecified: Secondary | ICD-10-CM | POA: Diagnosis not present

## 2024-03-12 DIAGNOSIS — E871 Hypo-osmolality and hyponatremia: Secondary | ICD-10-CM | POA: Diagnosis not present

## 2024-03-12 DIAGNOSIS — K5730 Diverticulosis of large intestine without perforation or abscess without bleeding: Secondary | ICD-10-CM | POA: Diagnosis not present

## 2024-03-12 DIAGNOSIS — D519 Vitamin B12 deficiency anemia, unspecified: Secondary | ICD-10-CM | POA: Diagnosis not present

## 2024-03-12 DIAGNOSIS — D509 Iron deficiency anemia, unspecified: Secondary | ICD-10-CM | POA: Diagnosis not present

## 2024-03-12 DIAGNOSIS — I130 Hypertensive heart and chronic kidney disease with heart failure and stage 1 through stage 4 chronic kidney disease, or unspecified chronic kidney disease: Secondary | ICD-10-CM | POA: Diagnosis not present

## 2024-03-12 DIAGNOSIS — M159 Polyosteoarthritis, unspecified: Secondary | ICD-10-CM | POA: Diagnosis not present

## 2024-03-12 DIAGNOSIS — J9811 Atelectasis: Secondary | ICD-10-CM | POA: Diagnosis not present

## 2024-03-12 DIAGNOSIS — R739 Hyperglycemia, unspecified: Secondary | ICD-10-CM | POA: Diagnosis not present

## 2024-03-12 DIAGNOSIS — I951 Orthostatic hypotension: Secondary | ICD-10-CM | POA: Diagnosis not present

## 2024-03-12 DIAGNOSIS — N1832 Chronic kidney disease, stage 3b: Secondary | ICD-10-CM | POA: Diagnosis not present

## 2024-03-12 DIAGNOSIS — D692 Other nonthrombocytopenic purpura: Secondary | ICD-10-CM | POA: Diagnosis not present

## 2024-03-12 NOTE — Progress Notes (Signed)
 Follow Up Note 03/15/2024  Reason for Visit  F/u   **  [facility]hill   $ 5.00  Assessment  1) Lower back pain.  Stable. Likely musculoskeletal.  L4-5 facet arthropathy.  Historical suggestion for possible right lumbar radiculopathy.  We reviewed treatment concepts and options as well as prognosis.  May consider trial of empiric transforaminal ESI.  Last MRI L-spine 03/08/2020.  May consider trial of MBB, ESI, PT. Patient unable to tolerate dose reduction of venlafaxine from 225 mg down to 150 mg.  Will see how she tolerates more gradual dose reduction by 37.5 mg.  2) RLS.  Stable.  3) Neck pain.  Likely musculoskeletal.  Will see how she tolerates very gradual venlafaxine taper.  Next: Requip  XR, NCS, SNRI, muscle relaxer, mirapex, trochanteric bursa steroid injection, right L5/S1 transforaminal ESI  Plan  1) Venlafaxine reduction from 225 mg to 187.5 mg nightly for 2 weeks, requested call back for clinical update as tolerated    I plan to see Janice Glass back for Return in about 6 months (around 09/14/2024).  She understands to call me for any questions or concerns.   ___________ L. Charlyne, MD Certifications in Neurology, Clinical Neurophysiology, Neuroimaging  HPI  Janice Glass is a 83 y.o. WF with LBP, PN, RLS, chronic insomnia.   Patient previously unable to tolerate dose reduction of venlafaxine from 225 mg to 150 mg  Patient was apparently recently hospitalized, hospital records not available for review at this time Concern was raised for potential polypharmacy by the hospitalist at the time Previous attempt to reduce patient's dose from 225 to 150 mg was complicated by vertigo and psychoses  Low back pain has been largely manageable, not as debilitating as it has been before Sweeping may worsen back pain No B/B incontinence, saddle anesthesia No leg weakness  Last MRI L-spine 03/08/2020. Most significant for L4-5 facet joint arthropathy.  Initial Hx: Feet  and legs chronically, intermittent Knee shots with some limited benefit Knee pain since childhood which would occur intermittently through adulthood Has RLS, med use is sedating, years duration, nightly, using ropinirole , but too sedating so using infrequently, tried mirapex but caused HA, gabapentin not tolerated in the past, no Lyrica, no patches  Burning in bottom of feet, no benefit with orthotic shoes Tramadol helps with pain, years duration of use, uses on a nightly basis May inconsistently help in hot bath Using aspirin  for heart related issues per the patient Occasional lower back pains Seen by podiatry in [place] and noted to need lumbar issue attention Had ESI 2 yrs ago which helped with back pain at the time, unclear for any benefit for her feet  Chronic issues with insomnia, sleep onset and maintenance, precedes pain issues  Reviewed last PCP note from 08/12/2018.  Patient with chronic bilateral knee and foot pain.  Did receive Kenalog  injection with transient benefit only.  Receiving chronic tramadol.  Medications include Xanax , metoprolol , tramadol.  Reviewed images and report from CT abdomen and pelvis on 04/13/2018.  No significant spine dysmorphology seen.  Exam  BP 138/68 (BP Location: Left arm, Patient Position: Sitting)   Pulse 64   LMP  (LMP Unknown)   SpO2 95%   [name] in mild distress. Afebrile. Cooperative with examiner. Anicteric. Lids normal. No audible wheeze. No accessory respiratory muscle use. No rash in arms. No ecchymoses of arms/hands. Mild right lumbosacral junction palpable tenderness.  SLR negative.   Language intact.  EOMI. PER.  LT symmetric at  V2.  Face symmetric. Phonation intact. Symmetric shoulder shrug.  No pronator drift. No tremor seen.  Strength symmetric and full in upper and lower extremities. DTRs symmetric at UEs and LEs. RAM intact. FNF intact.  Gait cautious, assisted by single-point cane. Sensory exam  symmetrically intact to UE proprioception; to cold in UEs.  Vibratory intact in all 4 distal extremities. No LT asymmetry at calves.   PM/SH   Allergies  Allergen Reactions  . Codeine Other (See Comments)    unknown  . Gabapentin GI Intolerance  . Sulfamethoxazole Other (See Comments)    unknown  . Zolpidem Other (See Comments)    Sleep walk  . Perfume Other (See Comments)    sneezing   Current Outpatient Medications  Medication Sig Dispense Refill  . acetaminophen  (TYLENOL ) 500 mg tablet Take 500 mg by mouth as needed.    . albuterol  HFA (PROVENTIL  HFA;VENTOLIN  HFA;PROAIR  HFA) 90 mcg/actuation inhaler INHALE 2 PUFFS BY MOUTH EVERY 6 HOURS AS NEEDED FOR SHORTNESS OF BREATH    . aspirin  81 mg EC tablet Take 81 mg by mouth Once Daily.    . carvediloL  (COREG ) 25 mg tablet Take 1 tablet by mouth in the morning and 1 tablet in the evening.    . dilTIAZem  (CARDIZEM  CD) 240 mg 24 hr capsule     . ergocalciferol (VITAMIN D2) 1,250 mcg (50,000 unit) capsule Take 1 capsule by mouth once a week 12 capsule 0  . fluticasone propionate (FLONASE) 50 mcg/spray nasal spray USE 2 SPRAYS IN EACH NOSTRIL EVERY DAY 48 g 0  . furosemide (LASIX) 40 mg tablet Take 40 mg by mouth Once Daily.    . levothyroxine  (SYNTHROID ) 125 mcg tablet Take 125 mcg by mouth Once Daily.    . losartan  (COZAAR ) 100 mg tablet     . meclizine (ANTIVERT) 25 mg tablet Take 25 mg by mouth 3 (three) times a day as needed.    . meloxicam (MOBIC) 7.5 mg tablet Take 1 tablet by mouth once daily for 30 days (Patient taking differently: Take 7.5 mg by mouth daily as needed.) 30 tablet 0  . metoprolol  tartrate (LOPRESSOR ) 100 mg tablet Take 1 tablet by mouth 2 (two) times a day.    . nitroglycerin  (NITROSTAT ) 0.4 mg SL tablet Place 0.4 mg under the tongue every 5 (five) minutes as needed.    SABRA omeprazole (PriLOSEC) 40 mg DR capsule Take 40 mg by mouth 2 (two) times a day. 180 capsule 1  . ondansetron  (ZOFRAN -ODT) 4 mg disintegrating  tablet Take 4 mg by mouth every 8 (eight) hours as needed.    . potassium chloride  (KLOR-CON ) 20 mEq ER tablet     . rosuvastatin (CRESTOR) 20 mg tablet 1 tab twice a wk    . sodium chloride  1 gram tablet Take 1 tablet (1 g total) by mouth in the morning and 1 tablet (1 g total) at noon and 1 tablet (1 g total) in the evening. Take with meals. 90 tablet 0  . traMADoL (ULTRAM) 50 mg tablet TAKE 1 TO 2 TABLETS BY MOUTH EVERY 6 HOURS AS NEEDED FOR PAIN 60 tablet 3  . venlafaxine (EFFEXOR XR) 150 mg 24 hr capsule Take 1 capsule (150 mg total) by mouth daily. 30 capsule 0  . venlafaxine (EFFEXOR XR) 37.5 mg 24 hr capsule Take 1 capsule (37.5 mg total) by mouth daily. 30 capsule 0   No current facility-administered medications for this visit.   Past Medical History:  Diagnosis Date  .  Anemia   . Anxiety   . Hypertrophy of bone    Past Surgical History:  Procedure Laterality Date  . COLONOSCOPY N/A 02/11/2018   Procedure: Colonoscopy;  Surgeon: Ronal Karna Ferraris, MD;  Location: [facility];  Service: Gastroenterology;  Laterality: N/A;  . ESOPHAGOGASTRODUODENOSCOPY N/A 02/11/2018   Procedure: EGD;  Surgeon: Ronal Karna Ferraris, MD;  Location: [facility] OR;  Service: Gastroenterology;  Laterality: N/A;   Family History  Problem Relation Name Age of Onset  . Hypertension Mother      LAB DATA   Lab Results  Component Value Date   WBC 10.35 12/20/2022   HGB 9.9 (L) 12/20/2022   HCT 29.8 (L) 12/20/2022   PLT 293 12/20/2022   CHOL 153 11/16/2019   TRIG 116 11/16/2019   HDL 55 11/16/2019   LDLDIRECT 90 11/16/2019   ALT 10 12/16/2022   AST 18 12/16/2022   NA 133 (L) 12/20/2022   K 3.8 12/20/2022   CL 99 12/20/2022   CREATININE 0.77 12/20/2022   BUN 6 (L) 12/20/2022   CO2 27 12/20/2022   INR 1.1 12/17/2022   Admission on 12/16/2022, Discharged on 12/20/2022  Component Date Value Ref Range Status  . Component Type 12/16/2022 RED CELLS   Final  . Crossmatch Expiration 12/16/2022  12-19-2022,2359   Final  . Type & Rh (ABORH) 12/16/2022 O Positive   Final  . Antibody Screen 12/16/2022 NEG   Final  . Phosphorus 12/16/2022 3.4  2.5 - 5.0 mg/dL Final  . Magnesium 96/74/7975 1.5 (L)  1.9 - 2.7 mg/dL Final  . Lipase 96/74/7975 19  11 - 82 U/L Final  . Albumin 12/16/2022 3.3 (L)  3.5 - 5.7 g/dL Final  . Bilirubin, Total 12/16/2022 0.5  0.3 - 1.0 mg/dL Final  . Bilirubin, Direct 12/16/2022 0.1  0.0 - 0.2 mg/dL Final  . Alkaline Phosphatase (ALP) 12/16/2022 84  34 - 104 U/L Final  . Aspartate Aminotransferase (AST) 12/16/2022 18  13 - 39 U/L Final  . Alanine Aminotransferase (ALT) 12/16/2022 10  7 - 52 U/L Final  . Total Protein 12/16/2022 5.9 (L)  6.4 - 8.9 g/dL Final  . Sodium 96/74/7975 127 (L)  136 - 145 mmol/L Final  . Potassium 12/16/2022 4.0  3.4 - 4.5 mmol/L Final  . Chloride 12/16/2022 93 (L)  98 - 107 mmol/L Final  . CO2 12/16/2022 24  21 - 31 mmol/L Final  . Anion Gap 12/16/2022 10  6 - 14 mmol/L Final  . Glucose, Random 12/16/2022 97  70 - 99 mg/dL Final  . Blood Urea Nitrogen (BUN) 12/16/2022 19  7 - 25 mg/dL Final  . Creatinine 96/74/7975 1.23 (H)  0.60 - 1.20 mg/dL Final  . eGFR 96/74/7975 45 (L)  >59 mL/min/1.37m2 Final   GFR estimated by CKD-EPI equations(NKF 2021).   Recommend confirmation of Cr-based eGFR by using Cys-based eGFR and other filtration markers (if applicable) in complex cases and clinical decision-making, as needed.  . Calcium  12/16/2022 8.6  8.6 - 10.3 mg/dL Final  . BUN/Creatinine Ratio 12/16/2022 15.4  10.0 - 20.0 Final  . Prothrombin Time (PT) 12/17/2022 13.9  11.8 - 14.4 seconds Final  . International Normalized Ratio (IN* 12/17/2022 1.1  0.0 - 1.5 Final  . WBC 12/16/2022 15.46 (H)  4.40 - 11.00 10*3/uL Final  . RBC 12/16/2022 3.24 (L)  4.10 - 5.10 10*6/uL Final  . Hemoglobin 12/16/2022 10.0 (L)  12.3 - 15.3 g/dL Final  . Hematocrit 96/74/7975 30.3 (L)  35.9 - 44.6 % Final  . Mean Corpuscular Volume (MCV) 12/16/2022 93.5  80.0 -  96.0 fL Final  . Mean Corpuscular Hemoglobin (MCH) 12/16/2022 30.7  27.5 - 33.2 pg Final  . Mean Corpuscular Hemoglobin Conc (* 12/16/2022 32.9 (L)  33.0 - 37.0 g/dL Final  . Red Cell Distribution Width (RDW) 12/16/2022 14.0  12.3 - 17.0 % Final  . Platelet Count (PLT) 12/16/2022 294  150 - 450 10*3/uL Final  . Mean Platelet Volume (MPV) 12/16/2022 7.9  6.8 - 10.2 fL Final  . Neutrophils % 12/16/2022 70  % Final  . Lymphocytes % 12/16/2022 14  % Final  . Monocytes % 12/16/2022 14  % Final  . Eosinophils % 12/16/2022 1  % Final  . Basophils % 12/16/2022 1  % Final  . Neutrophils Absolute 12/16/2022 10.70 (H)  1.80 - 7.80 10*3/uL Final  . Lymphocytes # 12/16/2022 2.20  1.00 - 4.80 10*3/uL Final  . Monocytes # 12/16/2022 2.20 (H)  0.00 - 0.80 10*3/uL Final  . Eosinophils # 12/16/2022 0.20  0.00 - 0.50 10*3/uL Final  . Basophils # 12/16/2022 0.10  0.00 - 0.20 10*3/uL Final  . Campylobacter PCR 12/17/2022 Not Detected  Not Detected Final  . Plesiomonas shigelloides PCR 12/17/2022 Not Detected  Not Detected Final  . Salmonella PCR 12/17/2022 Not Detected  Not Detected Final  . Vibrio PCR 12/17/2022 Not Detected  Not Detected Final  . Yersinia enterocolitica PCR 12/17/2022 Not Detected  Not Detected Final  . Enteroaggregative E coli PCR 12/17/2022 Not Detected  Not Detected Final  . Enteropathogenic E coli PCR 12/17/2022 Not Detected  Not Detected Final  . Enterotoxigenic E coli PCR 12/17/2022 Not Detected  Not Detected Final  . Shiga-Toxin-Producing E coli PCR 12/17/2022 Not Detected  Not Detected Final  . Shigella/Enteroinvasive E coli PCR 12/17/2022 Not Detected  Not Detected Final  . Cryptosporidium PCR 12/17/2022 Not Detected  Not Detected Final  . Cyclospora cayetanensis PCR 12/17/2022 Not Detected  Not Detected Final  . Entamoeba histolytica PCR 12/17/2022 Not Detected  Not Detected Final  . Giardia lamblia PCR 12/17/2022 Not Detected  Not Detected Final  . Adenovirus F 40/41 PCR  12/17/2022 Not Detected  Not Detected Final  . Astrovirus PCR 12/17/2022 Not Detected  Not Detected Final  . Norovirus GI/GII PCR 12/17/2022 Not Detected  Not Detected Final  . Rotavirus A PCR 12/17/2022 Not Detected  Not Detected Final  . Sapovirus PCR 12/17/2022 Not Detected  Not Detected Final  . Clostridium difficle Antigen/Toxin* 12/17/2022    Final   Interpretation: C difficile present but toxin not detected.   Comment: This pattern is most consistent with C. difficile colonization (occurs in 20% of hospitalized patients). There is no indication to treat C. difficile colonization and anti-C. difficile antibiotics will not prevent the subsequent infection. Antimicrobial therapy and proton pump inhibitors should be avoided. Consideration should be given to medication causes of diarrhea, such as laxatives, stool softeners, colchicine, metformin, HIV protease inhibitors, antibiotics, or certain chemotherapy agents, among others. Enteral feeds are also a common cause of diarrhea. If symptoms and signs are consistent with colitis and risk factors are present C. difficile (e.g. recent antibiotic exposure), additional testing using C. difficile PCR may be performed. C. difficile PCR testing requires prior authorization by the CAUSE delegate (web on-call or [phone]) or via formal ID consultation.   . C. Diff. Toxin A/B EIA 12/17/2022 Negative  Negative Final  . Clostridium difficile antigens 12/17/2022 Positive (A)  Negative Final  . C-Reactive Protein (CRP) 12/17/2022 41.4 (H)  <5.0 mg/L Final  . Type & Rh (ABORH) 12/17/2022 O Positive   Final  . Creatinine, Urine 12/19/2022 81  >=20 mg/dL Final  . Sodium, Urine 12/19/2022 40  Not Established mmol/L Final  . Sodium/Creatinine Ratio, Urine 12/19/2022 49  mmol/g creat Final  . Osmolality, Urine 12/19/2022 276  100 - 1,200 mosm/kg Final  . Uric Acid 12/16/2022 4.8  2.3 - 6.6 mg/dL Final  . WBC 96/73/7975 11.61 (H)  4.40 - 11.00 10*3/uL Final  .  RBC 12/17/2022 3.24 (L)  4.10 - 5.10 10*6/uL Final  . Hemoglobin 12/17/2022 9.9 (L)  12.3 - 15.3 g/dL Final  . Hematocrit 96/73/7975 30.2 (L)  35.9 - 44.6 % Final  . Mean Corpuscular Volume (MCV) 12/17/2022 93.5  80.0 - 96.0 fL Final  . Mean Corpuscular Hemoglobin (MCH) 12/17/2022 30.6  27.5 - 33.2 pg Final  . Mean Corpuscular Hemoglobin Conc (* 12/17/2022 32.7 (L)  33.0 - 37.0 g/dL Final  . Red Cell Distribution Width (RDW) 12/17/2022 14.1  12.3 - 17.0 % Final  . Platelet Count (PLT) 12/17/2022 278  150 - 450 10*3/uL Final  . Mean Platelet Volume (MPV) 12/17/2022 8.2  6.8 - 10.2 fL Final  . Sodium 12/17/2022 128 (L)  136 - 145 mmol/L Final  . Potassium 12/17/2022 3.7  3.4 - 4.5 mmol/L Final  . Chloride 12/17/2022 95 (L)  98 - 107 mmol/L Final  . CO2 12/17/2022 24  21 - 31 mmol/L Final  . Anion Gap 12/17/2022 9  6 - 14 mmol/L Final  . Glucose, Random 12/17/2022 104 (H)  70 - 99 mg/dL Final  . Blood Urea Nitrogen (BUN) 12/17/2022 13  7 - 25 mg/dL Final  . Creatinine 96/73/7975 1.03  0.60 - 1.20 mg/dL Final  . eGFR 96/73/7975 55 (L)  >59 mL/min/1.49m2 Final   GFR estimated by CKD-EPI equations(NKF 2021).   Recommend confirmation of Cr-based eGFR by using Cys-based eGFR and other filtration markers (if applicable) in complex cases and clinical decision-making, as needed.  . Calcium  12/17/2022 8.4 (L)  8.6 - 10.3 mg/dL Final  . BUN/Creatinine Ratio 12/17/2022    Final   Creatinine is normal, ratio is not clinically indicated.   . Magnesium 12/17/2022 2.9 (H)  1.9 - 2.7 mg/dL Final  . WBC 96/73/7975 11.70 (H)  4.40 - 11.00 10*3/uL Final  . RBC 12/17/2022 3.24 (L)  4.10 - 5.10 10*6/uL Final  . Hemoglobin 12/17/2022 10.0 (L)  12.3 - 15.3 g/dL Final  . Hematocrit 96/73/7975 30.1 (L)  35.9 - 44.6 % Final  . Mean Corpuscular Volume (MCV) 12/17/2022 92.7  80.0 - 96.0 fL Final  . Mean Corpuscular Hemoglobin (MCH) 12/17/2022 30.8  27.5 - 33.2 pg Final  . Mean Corpuscular Hemoglobin Conc (*  12/17/2022 33.2  33.0 - 37.0 g/dL Final  . Red Cell Distribution Width (RDW) 12/17/2022 14.0  12.3 - 17.0 % Final  . Platelet Count (PLT) 12/17/2022 298  150 - 450 10*3/uL Final  . Mean Platelet Volume (MPV) 12/17/2022 7.8  6.8 - 10.2 fL Final  . WBC 12/18/2022 11.93 (H)  4.40 - 11.00 10*3/uL Final  . RBC 12/18/2022 3.28 (L)  4.10 - 5.10 10*6/uL Final  . Hemoglobin 12/18/2022 10.2 (L)  12.3 - 15.3 g/dL Final  . Hematocrit 96/72/7975 30.6 (L)  35.9 - 44.6 % Final  . Mean Corpuscular Volume (MCV) 12/18/2022 93.2  80.0 - 96.0 fL Final  . Mean Corpuscular  Hemoglobin (MCH) 12/18/2022 31.2  27.5 - 33.2 pg Final  . Mean Corpuscular Hemoglobin Conc (* 12/18/2022 33.5  33.0 - 37.0 g/dL Final  . Red Cell Distribution Width (RDW) 12/18/2022 14.1  12.3 - 17.0 % Final  . Platelet Count (PLT) 12/18/2022 294  150 - 450 10*3/uL Final  . Mean Platelet Volume (MPV) 12/18/2022 7.5  6.8 - 10.2 fL Final  . Sodium 12/18/2022 130 (L)  136 - 145 mmol/L Final  . Potassium 12/18/2022 3.9  3.4 - 4.5 mmol/L Final  . Chloride 12/18/2022 97 (L)  98 - 107 mmol/L Final  . CO2 12/18/2022 24  21 - 31 mmol/L Final  . Anion Gap 12/18/2022 9  6 - 14 mmol/L Final  . Glucose, Random 12/18/2022 105 (H)  70 - 99 mg/dL Final  . Blood Urea Nitrogen (BUN) 12/18/2022 10  7 - 25 mg/dL Final  . Creatinine 96/72/7975 0.93  0.60 - 1.20 mg/dL Final  . eGFR 96/72/7975 63  >59 mL/min/1.77m2 Final   GFR estimated by CKD-EPI equations(NKF 2021).   Recommend confirmation of Cr-based eGFR by using Cys-based eGFR and other filtration markers (if applicable) in complex cases and clinical decision-making, as needed.  . Calcium  12/18/2022 8.4 (L)  8.6 - 10.3 mg/dL Final  . BUN/Creatinine Ratio 12/18/2022    Final   Creatinine is normal, ratio is not clinically indicated.   . Magnesium 12/18/2022 2.1  1.9 - 2.7 mg/dL Final  . WBC 96/72/7975 9.67  4.40 - 11.00 10*3/uL Final  . RBC 12/18/2022 3.22 (L)  4.10 - 5.10 10*6/uL Final  . Hemoglobin  12/18/2022 10.1 (L)  12.3 - 15.3 g/dL Final  . Hematocrit 96/72/7975 30.1 (L)  35.9 - 44.6 % Final  . Mean Corpuscular Volume (MCV) 12/18/2022 93.6  80.0 - 96.0 fL Final  . Mean Corpuscular Hemoglobin (MCH) 12/18/2022 31.3  27.5 - 33.2 pg Final  . Mean Corpuscular Hemoglobin Conc (* 12/18/2022 33.5  33.0 - 37.0 g/dL Final  . Red Cell Distribution Width (RDW) 12/18/2022 14.0  12.3 - 17.0 % Final  . Platelet Count (PLT) 12/18/2022 269  150 - 450 10*3/uL Final  . Mean Platelet Volume (MPV) 12/18/2022 7.4  6.8 - 10.2 fL Final  . WBC 12/18/2022 9.69  4.40 - 11.00 10*3/uL Final  . RBC 12/18/2022 3.28 (L)  4.10 - 5.10 10*6/uL Final  . Hemoglobin 12/18/2022 10.3 (L)  12.3 - 15.3 g/dL Final  . Hematocrit 96/72/7975 30.9 (L)  35.9 - 44.6 % Final  . Mean Corpuscular Volume (MCV) 12/18/2022 94.1  80.0 - 96.0 fL Final  . Mean Corpuscular Hemoglobin (MCH) 12/18/2022 31.3  27.5 - 33.2 pg Final  . Mean Corpuscular Hemoglobin Conc (* 12/18/2022 33.3  33.0 - 37.0 g/dL Final  . Red Cell Distribution Width (RDW) 12/18/2022 14.0  12.3 - 17.0 % Final  . Platelet Count (PLT) 12/18/2022 310  150 - 450 10*3/uL Final  . Mean Platelet Volume (MPV) 12/18/2022 7.5  6.8 - 10.2 fL Final  . Sodium 12/19/2022 132 (L)  136 - 145 mmol/L Final  . Potassium 12/19/2022 3.6  3.4 - 4.5 mmol/L Final  . Chloride 12/19/2022 99  98 - 107 mmol/L Final  . CO2 12/19/2022 25  21 - 31 mmol/L Final  . Anion Gap 12/19/2022 8  6 - 14 mmol/L Final  . Glucose, Random 12/19/2022 89  70 - 99 mg/dL Final  . Blood Urea Nitrogen (BUN) 12/19/2022 9  7 - 25 mg/dL Final  . Creatinine 96/71/7975  0.88  0.60 - 1.20 mg/dL Final  . eGFR 96/71/7975 67  >59 mL/min/1.64m2 Final   GFR estimated by CKD-EPI equations(NKF 2021).   Recommend confirmation of Cr-based eGFR by using Cys-based eGFR and other filtration markers (if applicable) in complex cases and clinical decision-making, as needed.  . Calcium  12/19/2022 8.3 (L)  8.6 - 10.3 mg/dL Final  .  BUN/Creatinine Ratio 12/19/2022    Final   Creatinine is normal, ratio is not clinically indicated.   . Magnesium 12/19/2022 1.7 (L)  1.9 - 2.7 mg/dL Final  . Sodium 96/70/7975 133 (L)  136 - 145 mmol/L Final  . Potassium 12/20/2022 3.8  3.4 - 4.5 mmol/L Final  . Chloride 12/20/2022 99  98 - 107 mmol/L Final  . CO2 12/20/2022 27  21 - 31 mmol/L Final  . Anion Gap 12/20/2022 7  6 - 14 mmol/L Final  . Glucose, Random 12/20/2022 107 (H)  70 - 99 mg/dL Final  . Blood Urea Nitrogen (BUN) 12/20/2022 6 (L)  7 - 25 mg/dL Final  . Creatinine 96/70/7975 0.77  0.60 - 1.20 mg/dL Final  . eGFR 96/70/7975 78  >59 mL/min/1.69m2 Final   GFR estimated by CKD-EPI equations(NKF 2021).   Recommend confirmation of Cr-based eGFR by using Cys-based eGFR and other filtration markers (if applicable) in complex cases and clinical decision-making, as needed.  . Calcium  12/20/2022 8.4 (L)  8.6 - 10.3 mg/dL Final  . BUN/Creatinine Ratio 12/20/2022    Final   Creatinine is normal, ratio is not clinically indicated.   . Magnesium 12/20/2022 1.8 (L)  1.9 - 2.7 mg/dL Final  . WBC 96/70/7975 10.35  4.40 - 11.00 10*3/uL Final  . RBC 12/20/2022 3.16 (L)  4.10 - 5.10 10*6/uL Final  . Hemoglobin 12/20/2022 9.9 (L)  12.3 - 15.3 g/dL Final  . Hematocrit 96/70/7975 29.8 (L)  35.9 - 44.6 % Final  . Mean Corpuscular Volume (MCV) 12/20/2022 94.1  80.0 - 96.0 fL Final  . Mean Corpuscular Hemoglobin (MCH) 12/20/2022 31.2  27.5 - 33.2 pg Final  . Mean Corpuscular Hemoglobin Conc (* 12/20/2022 33.2  33.0 - 37.0 g/dL Final  . Red Cell Distribution Width (RDW) 12/20/2022 14.2  12.3 - 17.0 % Final  . Platelet Count (PLT) 12/20/2022 293  150 - 450 10*3/uL Final  . Mean Platelet Volume (MPV) 12/20/2022 7.6  6.8 - 10.2 fL Final  . Neutrophils % 12/20/2022 59  % Final  . Lymphocytes % 12/20/2022 19  % Final  . Monocytes % 12/20/2022 18  % Final  . Eosinophils % 12/20/2022 4  % Final  . Basophils % 12/20/2022 1  % Final  .  Neutrophils Absolute 12/20/2022 6.10  1.80 - 7.80 10*3/uL Final  . Lymphocytes # 12/20/2022 2.00  1.00 - 4.80 10*3/uL Final  . Monocytes # 12/20/2022 1.80 (H)  0.00 - 0.80 10*3/uL Final  . Eosinophils # 12/20/2022 0.40  0.00 - 0.50 10*3/uL Final  . Basophils # 12/20/2022 0.10  0.00 - 0.20 10*3/uL Final  . Case Report 12/20/2022    Final                   Value:Surgical Pathology Report                         Case: [id number]  Authorizing Provider:  Gunnar Chris Daniels, MD             Collected:           12/20/2022 0820             Ordering Location:     [facility]  Received:            12/20/2022 1113                                    [facility]                                                                         [facility] -  [address] [facility]                                                                         [facility]                                                               Pathologist:           Will Malm Croitoru,                                                                           MD                                                                          Specimens:   A) - Colon Polyp, Cecum, cecal polyp (cold snare)                                                  B) - Colon Polyp, Hepatic Flexure, hepatic flexure polyp ( cold snare)  C) - Colon, Sigmoid, sigmoid bx r/o ischemia                                            . Final Diagnosis 12/20/2022    Final                   Value:                         A.  CECAL POLYP, BIOPSY:                                                   TUBULAR ADENOMA.                                                  B.  HEPATIC FLEXURE POLYP, BIOPSY:                                                   TUBULAR ADENOMA.                                                  C.  SIGMOID COLON BIOPSY:                                                    COLONIC MUCOSA WITH FOCAL ACUTE INFLAMMATION AND GLAND DROPOUT,                          CONSISTENT                           WITH ISCHEMIC CHANGES.                           SABRA Schultze Description 12/20/2022    Final                   Value:A.                         Received in formalin labeled with the patient's name, medical record                          number and cecal polyp is a 1.2 cm tan polypoid lesion entirely                          submitted in A1.  B.                         Received in formalin labeled with the patient's name, medical record                          number and hepatic flexure polyp cold snare is a 1 cm tan polypoid                          lesion entirely submitted in B1.                         C.                         Received in formalin labeled with the patient's name, medical record                          number and sigmoid biopsy rule out ischemia are 4 tan soft tissue                          fragments, 0.2 cm up to 0.3 cm, entirely submitted in C1.                                                  Gross performed at:                         [facility]                         [address]                         [place], [place]  [address]                         CLIA #: [id number]                                                   Gross completed by Reena Che, P.A., on December 20, 2022                           . Clinical Information 12/20/2022    Final                   Value:M15.9 - Generalized osteoarthritis of hand [ICD-10-CM]                         M81.0 - Osteoporosis, unspecified osteoporosis type, unspecified                          pathological fracture presence [ICD-10-CM]  M48.061 - Spinal stenosis at L4-L5 level [ICD-10-CM]                         R53.82 - Chronic fatigue [ICD-10-CM]                         M53.87 - Sciatica of left  side associated with disorder of lumbosacral                          spine [ICD-10-CM]                         I10 - Benign essential hypertension [ICD-10-CM]                         I11.9 - Hypertensive heart disease without heart failure [ICD-10-CM]                         K62.5 - Rectal bleeding [ICD-10-CM]                         K55.9 - Ischemic colitis (CMS/HCC) [ICD-10-CM]                                                                                                                               . Point of Service 12/20/2022    Final                   Value:[facility], CLIA# [id number], [address], [place]                          [place] [address]    IMAGING DATA   MRI of the lower back shows fair amount of arthritis at L4-5.  Would consider doing facet joint injections at this level next if wishes to.  Can come in to review images and discuss treatment options further if wishes.   Narrative & Impression  CLINICAL DATA:  Low back pain and right lower extremity discomfort. No known injury.   EXAM: MRI LUMBAR SPINE WITHOUT CONTRAST   TECHNIQUE: Multiplanar, multisequence MR imaging of the lumbar spine was performed. No intravenous contrast was administered.   COMPARISON:  None.   FINDINGS: Segmentation:  Standard.   Alignment:  Maintained.   Vertebrae:  No fracture, evidence of discitis, or bone lesion.   Conus medullaris and cauda equina: Conus extends to the L1 level. Conus and cauda equina appear normal.   Paraspinal and other soft tissues: Tiny left renal cyst noted.   Disc levels:   T11-12 and T12-L1 are imaged in the sagittal plane only and negative.   L1-2: Very shallow right paracentral protrusion without stenosis.   L2-3: Minimal disc bulge  without stenosis.   L3-4: Mild facet degenerative change. Very shallow right foraminal protrusion causes mild foraminal narrowing. The central canal and left foramen are open.   L4-5:  Moderate facet arthropathy, shallow disc bulge and ligamentum flavum thickening. There is mild to moderate central canal stenosis. The foramina are open.   L5-S1: Moderate facet degenerative change. No disc bulge or protrusion. Conjoined right S1 and S2 nerve roots noted.   IMPRESSION: Mild to moderate central canal stenosis at L4-5 due to a shallow disc bulge and ligamentum flavum thickening.   Shallow right foraminal protrusion at L3-4 causes mild right foraminal stenosis.     Electronically Signed   By: Debby Prader M.D.   On: 03/09/2020 11:20     ____________________  MDM  Orders No orders of the defined types were placed in this encounter.  Orders Placed This Encounter  Medications  . venlafaxine (EFFEXOR XR) 37.5 mg 24 hr capsule    Sig: Take 1 capsule (37.5 mg total) by mouth daily.    Dispense:  30 capsule    Refill:  0    To be taken along with 150 mg venlafaxine dose.  . venlafaxine (EFFEXOR XR) 150 mg 24 hr capsule    Sig: Take 1 capsule (150 mg total) by mouth daily.    Dispense:  30 capsule    Refill:  0    To be taken with 37.5 mg tab.

## 2024-03-15 DIAGNOSIS — M545 Low back pain, unspecified: Secondary | ICD-10-CM | POA: Diagnosis not present

## 2024-03-17 ENCOUNTER — Other Ambulatory Visit (HOSPITAL_BASED_OUTPATIENT_CLINIC_OR_DEPARTMENT_OTHER): Payer: Self-pay | Admitting: Physician Assistant

## 2024-03-17 DIAGNOSIS — E039 Hypothyroidism, unspecified: Secondary | ICD-10-CM | POA: Diagnosis not present

## 2024-03-17 DIAGNOSIS — E871 Hypo-osmolality and hyponatremia: Secondary | ICD-10-CM | POA: Diagnosis not present

## 2024-03-17 DIAGNOSIS — I509 Heart failure, unspecified: Secondary | ICD-10-CM | POA: Diagnosis not present

## 2024-03-17 DIAGNOSIS — E2839 Other primary ovarian failure: Secondary | ICD-10-CM

## 2024-03-17 DIAGNOSIS — Z6833 Body mass index (BMI) 33.0-33.9, adult: Secondary | ICD-10-CM | POA: Diagnosis not present

## 2024-03-22 DIAGNOSIS — I10 Essential (primary) hypertension: Secondary | ICD-10-CM | POA: Diagnosis not present

## 2024-03-23 DIAGNOSIS — J449 Chronic obstructive pulmonary disease, unspecified: Secondary | ICD-10-CM | POA: Diagnosis not present

## 2024-03-23 DIAGNOSIS — I10 Essential (primary) hypertension: Secondary | ICD-10-CM | POA: Diagnosis not present

## 2024-03-24 ENCOUNTER — Ambulatory Visit (INDEPENDENT_AMBULATORY_CARE_PROVIDER_SITE_OTHER)
Admission: RE | Admit: 2024-03-24 | Discharge: 2024-03-24 | Disposition: A | Discharge status: Home / Self Care | Source: Ambulatory Visit | Attending: Physician Assistant | Admitting: Physician Assistant

## 2024-03-24 DIAGNOSIS — M8589 Other specified disorders of bone density and structure, multiple sites: Secondary | ICD-10-CM | POA: Diagnosis not present

## 2024-03-24 DIAGNOSIS — E2839 Other primary ovarian failure: Secondary | ICD-10-CM

## 2024-03-24 DIAGNOSIS — Z780 Asymptomatic menopausal state: Secondary | ICD-10-CM | POA: Diagnosis not present

## 2024-03-31 DIAGNOSIS — D509 Iron deficiency anemia, unspecified: Secondary | ICD-10-CM | POA: Diagnosis not present

## 2024-03-31 DIAGNOSIS — E871 Hypo-osmolality and hyponatremia: Secondary | ICD-10-CM | POA: Diagnosis not present

## 2024-04-06 DIAGNOSIS — I509 Heart failure, unspecified: Secondary | ICD-10-CM | POA: Diagnosis not present

## 2024-04-07 DIAGNOSIS — E871 Hypo-osmolality and hyponatremia: Secondary | ICD-10-CM | POA: Diagnosis not present

## 2024-04-07 DIAGNOSIS — J449 Chronic obstructive pulmonary disease, unspecified: Secondary | ICD-10-CM | POA: Diagnosis not present

## 2024-04-07 DIAGNOSIS — Z6833 Body mass index (BMI) 33.0-33.9, adult: Secondary | ICD-10-CM | POA: Diagnosis not present

## 2024-04-07 DIAGNOSIS — I509 Heart failure, unspecified: Secondary | ICD-10-CM | POA: Diagnosis not present

## 2024-04-07 DIAGNOSIS — N1831 Chronic kidney disease, stage 3a: Secondary | ICD-10-CM | POA: Diagnosis not present

## 2024-04-13 DIAGNOSIS — I509 Heart failure, unspecified: Secondary | ICD-10-CM | POA: Diagnosis not present

## 2024-04-20 DIAGNOSIS — G4733 Obstructive sleep apnea (adult) (pediatric): Secondary | ICD-10-CM | POA: Diagnosis not present

## 2024-04-20 DIAGNOSIS — M85859 Other specified disorders of bone density and structure, unspecified thigh: Secondary | ICD-10-CM | POA: Diagnosis not present

## 2024-04-20 DIAGNOSIS — Z6834 Body mass index (BMI) 34.0-34.9, adult: Secondary | ICD-10-CM | POA: Diagnosis not present

## 2024-04-22 DIAGNOSIS — M85859 Other specified disorders of bone density and structure, unspecified thigh: Secondary | ICD-10-CM | POA: Diagnosis not present

## 2024-04-22 DIAGNOSIS — E871 Hypo-osmolality and hyponatremia: Secondary | ICD-10-CM | POA: Diagnosis not present

## 2024-04-22 DIAGNOSIS — I10 Essential (primary) hypertension: Secondary | ICD-10-CM | POA: Diagnosis not present

## 2024-04-22 DIAGNOSIS — D509 Iron deficiency anemia, unspecified: Secondary | ICD-10-CM | POA: Diagnosis not present

## 2024-04-23 DIAGNOSIS — J449 Chronic obstructive pulmonary disease, unspecified: Secondary | ICD-10-CM | POA: Diagnosis not present

## 2024-04-23 DIAGNOSIS — I10 Essential (primary) hypertension: Secondary | ICD-10-CM | POA: Diagnosis not present

## 2024-04-27 DIAGNOSIS — I509 Heart failure, unspecified: Secondary | ICD-10-CM | POA: Diagnosis not present

## 2024-05-03 DIAGNOSIS — I509 Heart failure, unspecified: Secondary | ICD-10-CM | POA: Diagnosis not present

## 2024-05-10 DIAGNOSIS — G4733 Obstructive sleep apnea (adult) (pediatric): Secondary | ICD-10-CM | POA: Diagnosis not present

## 2024-05-11 ENCOUNTER — Ambulatory Visit (INDEPENDENT_AMBULATORY_CARE_PROVIDER_SITE_OTHER)
Admission: RE | Admit: 2024-05-11 | Discharge: 2024-05-11 | Disposition: A | Discharge status: Home / Self Care | Source: Ambulatory Visit | Attending: Physician Assistant | Admitting: Physician Assistant

## 2024-05-11 ENCOUNTER — Other Ambulatory Visit (HOSPITAL_BASED_OUTPATIENT_CLINIC_OR_DEPARTMENT_OTHER): Payer: Self-pay | Admitting: Physician Assistant

## 2024-05-11 DIAGNOSIS — M1710 Unilateral primary osteoarthritis, unspecified knee: Secondary | ICD-10-CM | POA: Diagnosis not present

## 2024-05-11 DIAGNOSIS — M25569 Pain in unspecified knee: Secondary | ICD-10-CM | POA: Diagnosis not present

## 2024-05-11 DIAGNOSIS — M25562 Pain in left knee: Secondary | ICD-10-CM

## 2024-05-11 DIAGNOSIS — M159 Polyosteoarthritis, unspecified: Secondary | ICD-10-CM | POA: Diagnosis not present

## 2024-05-11 DIAGNOSIS — M25561 Pain in right knee: Secondary | ICD-10-CM

## 2024-05-11 DIAGNOSIS — M1711 Unilateral primary osteoarthritis, right knee: Secondary | ICD-10-CM | POA: Diagnosis not present

## 2024-05-11 DIAGNOSIS — Z6834 Body mass index (BMI) 34.0-34.9, adult: Secondary | ICD-10-CM | POA: Diagnosis not present

## 2024-05-11 DIAGNOSIS — G8929 Other chronic pain: Secondary | ICD-10-CM | POA: Diagnosis not present

## 2024-05-20 DIAGNOSIS — Z6834 Body mass index (BMI) 34.0-34.9, adult: Secondary | ICD-10-CM | POA: Diagnosis not present

## 2024-05-20 DIAGNOSIS — G4733 Obstructive sleep apnea (adult) (pediatric): Secondary | ICD-10-CM | POA: Diagnosis not present

## 2024-05-23 DIAGNOSIS — I10 Essential (primary) hypertension: Secondary | ICD-10-CM | POA: Diagnosis not present

## 2024-05-24 DIAGNOSIS — I10 Essential (primary) hypertension: Secondary | ICD-10-CM | POA: Diagnosis not present

## 2024-05-24 DIAGNOSIS — J449 Chronic obstructive pulmonary disease, unspecified: Secondary | ICD-10-CM | POA: Diagnosis not present

## 2024-05-31 DIAGNOSIS — F430 Acute stress reaction: Secondary | ICD-10-CM | POA: Diagnosis not present

## 2024-05-31 DIAGNOSIS — Z6833 Body mass index (BMI) 33.0-33.9, adult: Secondary | ICD-10-CM | POA: Diagnosis not present

## 2024-05-31 DIAGNOSIS — M170 Bilateral primary osteoarthritis of knee: Secondary | ICD-10-CM | POA: Diagnosis not present

## 2024-05-31 DIAGNOSIS — M25562 Pain in left knee: Secondary | ICD-10-CM | POA: Diagnosis not present

## 2024-05-31 DIAGNOSIS — M25561 Pain in right knee: Secondary | ICD-10-CM | POA: Diagnosis not present

## 2024-06-03 DIAGNOSIS — H6121 Impacted cerumen, right ear: Secondary | ICD-10-CM | POA: Diagnosis not present

## 2024-06-07 DIAGNOSIS — Z79899 Other long term (current) drug therapy: Secondary | ICD-10-CM | POA: Diagnosis not present

## 2024-06-14 DIAGNOSIS — Z79899 Other long term (current) drug therapy: Secondary | ICD-10-CM | POA: Diagnosis not present

## 2024-06-22 DIAGNOSIS — I10 Essential (primary) hypertension: Secondary | ICD-10-CM | POA: Diagnosis not present

## 2024-06-23 DIAGNOSIS — I10 Essential (primary) hypertension: Secondary | ICD-10-CM | POA: Diagnosis not present

## 2024-06-23 DIAGNOSIS — J449 Chronic obstructive pulmonary disease, unspecified: Secondary | ICD-10-CM | POA: Diagnosis not present

## 2024-07-19 DIAGNOSIS — Z79899 Other long term (current) drug therapy: Secondary | ICD-10-CM | POA: Diagnosis not present

## 2024-07-20 DIAGNOSIS — M25562 Pain in left knee: Secondary | ICD-10-CM | POA: Diagnosis not present

## 2024-07-20 DIAGNOSIS — M25561 Pain in right knee: Secondary | ICD-10-CM | POA: Diagnosis not present

## 2024-07-23 DIAGNOSIS — I10 Essential (primary) hypertension: Secondary | ICD-10-CM | POA: Diagnosis not present

## 2024-07-24 DIAGNOSIS — J449 Chronic obstructive pulmonary disease, unspecified: Secondary | ICD-10-CM | POA: Diagnosis not present

## 2024-07-26 DIAGNOSIS — Z79899 Other long term (current) drug therapy: Secondary | ICD-10-CM | POA: Diagnosis not present

## 2024-08-09 DIAGNOSIS — N1832 Chronic kidney disease, stage 3b: Secondary | ICD-10-CM | POA: Diagnosis not present

## 2024-08-09 DIAGNOSIS — E039 Hypothyroidism, unspecified: Secondary | ICD-10-CM | POA: Diagnosis not present

## 2024-08-09 DIAGNOSIS — E785 Hyperlipidemia, unspecified: Secondary | ICD-10-CM | POA: Diagnosis not present

## 2024-08-12 DIAGNOSIS — Z1389 Encounter for screening for other disorder: Secondary | ICD-10-CM | POA: Diagnosis not present

## 2024-08-12 DIAGNOSIS — Z139 Encounter for screening, unspecified: Secondary | ICD-10-CM | POA: Diagnosis not present

## 2024-08-12 DIAGNOSIS — Z6834 Body mass index (BMI) 34.0-34.9, adult: Secondary | ICD-10-CM | POA: Diagnosis not present

## 2024-08-12 DIAGNOSIS — E785 Hyperlipidemia, unspecified: Secondary | ICD-10-CM | POA: Diagnosis not present

## 2024-08-12 DIAGNOSIS — Z1339 Encounter for screening examination for other mental health and behavioral disorders: Secondary | ICD-10-CM | POA: Diagnosis not present

## 2024-08-12 DIAGNOSIS — Z23 Encounter for immunization: Secondary | ICD-10-CM | POA: Diagnosis not present

## 2024-08-12 DIAGNOSIS — Z136 Encounter for screening for cardiovascular disorders: Secondary | ICD-10-CM | POA: Diagnosis not present

## 2024-08-12 DIAGNOSIS — E039 Hypothyroidism, unspecified: Secondary | ICD-10-CM | POA: Diagnosis not present

## 2024-08-12 DIAGNOSIS — Z0000 Encounter for general adult medical examination without abnormal findings: Secondary | ICD-10-CM | POA: Diagnosis not present

## 2024-08-12 DIAGNOSIS — I509 Heart failure, unspecified: Secondary | ICD-10-CM | POA: Diagnosis not present

## 2024-08-12 DIAGNOSIS — G4733 Obstructive sleep apnea (adult) (pediatric): Secondary | ICD-10-CM | POA: Diagnosis not present

## 2024-08-12 DIAGNOSIS — Z1331 Encounter for screening for depression: Secondary | ICD-10-CM | POA: Diagnosis not present

## 2024-08-22 DIAGNOSIS — I10 Essential (primary) hypertension: Secondary | ICD-10-CM | POA: Diagnosis not present

## 2024-08-23 DIAGNOSIS — I10 Essential (primary) hypertension: Secondary | ICD-10-CM | POA: Diagnosis not present

## 2024-08-23 DIAGNOSIS — J449 Chronic obstructive pulmonary disease, unspecified: Secondary | ICD-10-CM | POA: Diagnosis not present

## 2024-08-31 DIAGNOSIS — J029 Acute pharyngitis, unspecified: Secondary | ICD-10-CM | POA: Diagnosis not present

## 2024-08-31 DIAGNOSIS — R590 Localized enlarged lymph nodes: Secondary | ICD-10-CM | POA: Diagnosis not present

## 2024-08-31 DIAGNOSIS — Z20822 Contact with and (suspected) exposure to covid-19: Secondary | ICD-10-CM | POA: Diagnosis not present

## 2024-08-31 DIAGNOSIS — J069 Acute upper respiratory infection, unspecified: Secondary | ICD-10-CM | POA: Diagnosis not present

## 2024-08-31 DIAGNOSIS — Z6834 Body mass index (BMI) 34.0-34.9, adult: Secondary | ICD-10-CM | POA: Diagnosis not present

## 2024-09-07 ENCOUNTER — Ambulatory Visit: Attending: Cardiovascular Disease | Admitting: Cardiovascular Disease

## 2024-10-05 ENCOUNTER — Ambulatory Visit: Admitting: Cardiovascular Disease

## 2024-10-12 ENCOUNTER — Ambulatory Visit: Admitting: Cardiovascular Disease

## 2024-10-12 ENCOUNTER — Encounter: Payer: Self-pay | Admitting: Cardiovascular Disease

## 2024-10-12 VITALS — BP 120/64 | HR 64 | Ht 64.0 in | Wt 197.7 lb

## 2024-10-12 DIAGNOSIS — I5032 Chronic diastolic (congestive) heart failure: Secondary | ICD-10-CM

## 2024-10-12 DIAGNOSIS — E785 Hyperlipidemia, unspecified: Secondary | ICD-10-CM

## 2024-10-12 DIAGNOSIS — I10 Essential (primary) hypertension: Secondary | ICD-10-CM

## 2024-10-12 DIAGNOSIS — I359 Nonrheumatic aortic valve disorder, unspecified: Secondary | ICD-10-CM

## 2024-10-12 DIAGNOSIS — I350 Nonrheumatic aortic (valve) stenosis: Secondary | ICD-10-CM

## 2024-10-12 DIAGNOSIS — I2510 Atherosclerotic heart disease of native coronary artery without angina pectoris: Secondary | ICD-10-CM | POA: Diagnosis not present

## 2024-10-12 DIAGNOSIS — I471 Supraventricular tachycardia, unspecified: Secondary | ICD-10-CM | POA: Diagnosis not present

## 2024-10-12 NOTE — Progress Notes (Signed)
 "    Cardiology Office Note   Date:  10/12/2024   ID:  Vivien, Barretto 25-Jun-1941, MRN [id number]  PCP:  Trinidad Hun, MD  Cardiologist:   Deatrice Cage, MD   No chief complaint on file.      History of Present Illness: MAHAM QUINTIN is a 84 y.o. female who presents for a followup visit regarding coronary artery disease, chronic diastolic heart failure and aortic stenosis.  She has known history of coronary artery disease with previous drug-eluting stent placement to the LAD.   She had worsening dyspnea in 2020 and thus a right and left cardiac catheterization was done in September 2020 which showed widely patent proximal LAD stent with no significant restenosis.  There was stable 50% stenosis in the distal LAD and 50% stenosis in the proximal right coronary artery with significant ostial stenosis in a small second diagonal.  Right heart catheterization was normal.  There was 15 mm peak to peak gradient across the aortic valve consistent with mild aortic stenosis. No revascularization was required. She had a viral illness and November, 2022 and was also diagnosed with strep throat.  She had worsening shortness of breath with that but then she developed palpitations and worsening dyspnea and was hospitalized at [facility]  She was initially thought of having atrial fibrillation but it was sinus tachycardia with PACs. She was switched from amlodipine  to diltiazem .  2-week ZIO monitor showed sinus rhythm with short runs of SVT without atrial fibrillation.    She had worsening heart failure symptoms in February, 2023 and thus the dose of furosemide was increased to 40 mg once daily.  An echocardiogram was repeated which showed normal LV systolic function, grade 2 diastolic dysfunction with elevated left atrial pressure and mild to moderate aortic stenosis.  She had a syncopal episode in February, 2024 and was found to have dehydration and hypokalemia.  She was hospitalized in  March, 2024 with colitis.  She has been doing well with no chest pain or worsening dyspnea.  She takes her medications on a regular basis.  I reviewed her labs done in November which showed normal renal function.  Past Medical History:  Diagnosis Date   Anxiety    CAD (coronary artery disease)    a. 11/2009 Abnl myoview  - inferobasal ischemia;  b. 12/2009 PCI/DES to pLAD w/ 3x15 mm Promus DES;  c. Relook Cath 01/08/2010: patent stent, nonobs dzs. Relook cath 02/2012: patent LAD stent with mild disease.   Depression    GERD (gastroesophageal reflux disease)    HLD (hyperlipidemia)    Hypertensive heart disease    Hypothyroidism    Mild Carotid Arterial Disease    a. 04/2015 Carotid U/S: 1-39% bilat ICA stenosis.   Mild mitral regurgitation    a. 08/2015 Echo: Ef 55-60%, no rwma, mild MR.    Past Surgical History:  Procedure Laterality Date   CARDIAC CATHETERIZATION  12/2009   CARDIAC CATHETERIZATION N/A 06/03/2016   Procedure: Left Heart Cath and Coronary Angiography;  Surgeon: Dorn JINNY Lesches, MD;  Location: [facility] INVASIVE CV LAB;  Service: Cardiovascular;  Laterality: N/A;   CESAREAN SECTION     several    CORONARY ANGIOPLASTY WITH STENT PLACEMENT  12/2009   DES to proximal LAD   CORONARY STENT PLACEMENT     LEFT HEART CATHETERIZATION WITH CORONARY ANGIOGRAM N/A 03/20/2012   Procedure: LEFT HEART CATHETERIZATION WITH CORONARY ANGIOGRAM;  Surgeon: Debby JONETTA Como, MD;  Location: [facility] CATH LAB;  Service:  Cardiovascular;  Laterality: N/A;   RIGHT/LEFT HEART CATH AND CORONARY ANGIOGRAPHY N/A 06/02/2019   Procedure: RIGHT/LEFT HEART CATH AND CORONARY ANGIOGRAPHY;  Surgeon: Darron Deatrice LABOR, MD;  Location: [facility] INVASIVE CV LAB;  Service: Cardiovascular;  Laterality: N/A;     Current Outpatient Medications  Medication Sig Dispense Refill   aspirin  81 MG EC tablet Take 1 tablet (81 mg total) by mouth daily.     carvedilol  (COREG ) 12.5 MG tablet Take 1 tablet (12.5 mg total) by mouth 2 (two)  times daily. 180 tablet 1   diltiazem  (CARTIA  XT) 240 MG 24 hr capsule Take 1 capsule (240 mg total) by mouth daily. 90 capsule 3   acetaminophen  (TYLENOL ) 500 MG tablet Take 500-1,000 mg by mouth every 8 (eight) hours as needed for mild pain or headache.     anti-nausea (EMETROL) solution Take 10 mLs by mouth every 15 (fifteen) minutes as needed for nausea or vomiting.     cetirizine (ZYRTEC) 10 MG tablet Take 1 tablet by mouth daily.     ENTRESTO 24-26 MG Take 1 tablet by mouth 2 (two) times daily.     fluticasone (FLONASE) 50 MCG/ACT nasal spray Place 2 sprays into both nostrils 2 (two) times daily.      furosemide (LASIX) 40 MG tablet Take 1 tablet by mouth daily.     levothyroxine  (SYNTHROID ) 100 MCG tablet Take 100 mcg by mouth daily before breakfast.     loratadine (CLARITIN) 10 MG tablet Take 10 mg by mouth daily. (Patient not taking: Reported on 02/25/2023)     Multiple Vitamins-Minerals (EYE VITAMINS) CAPS Take 1 capsule by mouth daily.     omeprazole (PRILOSEC) 40 MG capsule Take 40 mg by mouth 2 (two) times daily.      ondansetron  (ZOFRAN ) 4 MG tablet Take 4 mg by mouth every 8 (eight) hours as needed for nausea or vomiting.      potassium chloride  (K-DUR) 10 MEQ tablet Take 10 mEq by mouth every evening.      promethazine  (PHENERGAN ) 25 MG tablet Take 1 tablet (25 mg total) by mouth every 6 (six) hours as needed for nausea or vomiting. (Patient not taking: Reported on 10/12/2024) 20 tablet 0   rosuvastatin (CRESTOR) 20 MG tablet Take 20 mg by mouth 2 (two) times a week.     traMADol (ULTRAM) 50 MG tablet Take 50 mg by mouth every 12 (twelve) hours as needed (pain).     Vitamin D, Ergocalciferol, (DRISDOL) 1.25 MG (50000 UT) CAPS capsule Take 50,000 Units by mouth every Thursday.     No current facility-administered medications for this visit.    Allergies:   Ciprofloxacin, Codeine, Gabapentin, Sulfa antibiotics, Sulfonamide derivatives, Wellbutrin [bupropion], and Zolpidem     Social History:  The patient  reports that she has never smoked. She has never used smokeless tobacco. She reports that she does not drink alcohol  and does not use drugs.   Family History:  The patient's family history includes Heart failure in her mother.    ROS:  Please see the history of present illness.   Otherwise, review of systems are positive for none.   All other systems are reviewed and negative.    PHYSICAL EXAM: VS:  BP 120/64 (BP Location: Left Arm, Patient Position: Sitting, Cuff Size: Normal)   Pulse 64   Ht 5' 4 (1.626 m)   Wt 197 lb 11.2 oz (89.7 kg)   SpO2 93%   BMI 33.94 kg/m  , BMI Body mass  index is 33.94 kg/m. GEN: Well nourished, well developed, in no acute distress  HEENT: normal  Neck: no JVD, carotid bruits, or masses Cardiac: RRR; no rubs, or gallops,. 2/6 systolic ejection murmur in the aortic area.  Trace bilateral leg edema Respiratory:  clear to auscultation bilaterally, normal work of breathing GI: soft, nontender, nondistended, + BS MS: no deformity or atrophy  Skin: warm and dry, no rash Neuro:  Strength and sensation are intact Psych: euthymic mood, full affect    EKG:  EKG is ordered today. EKG showed: Sinus rhythm with occasional Premature ventricular complexes Cannot rule out Anterior infarct (cited on or before 26-Aug-2023) When compared with ECG of 24-Feb-2024 10:51, Premature ventricular complexes are now Present Criteria for Inferior infarct are no longer Present Questionable change in initial forces of Anterior leads    Recent Labs: No results found for requested labs within last 365 days.    Lipid Panel    Component Value Date/Time   CHOL 168 06/01/2016 0333   TRIG 145 06/01/2016 0333   HDL 55 06/01/2016 0333   CHOLHDL 3.1 06/01/2016 0333   VLDL 29 06/01/2016 0333   LDLCALC 84 06/01/2016 0333      Wt Readings from Last 3 Encounters:  10/12/24 197 lb 11.2 oz (89.7 kg)  02/24/24 200 lb 12.8 oz (91.1 kg)   08/26/23 203 lb (92.1 kg)           No data to display            ASSESSMENT AND PLAN:  1.  Chronic diastolic heart failure: She appears to be euvolemic on furosemide 40 mg once daily.  Blood pressure is controlled on diltiazem , carvedilol  and Entresto.  She is not a good candidate for an SGLT2 inhibitor due to recurrent UTIs.  She has intermittent hypotension last year but her blood pressure has stabilized since then.  2. Coronary artery disease involving native coronary arteries without angina: Stable overall.  Continue medical therapy.   2.  Aortic stenosis: This was mild to moderate on most recent echocardiogram in March of 2025.  I requested a follow-up echocardiogram to be done in 6 months from now.  3. Essential hypertension: Blood pressure is now well-controlled on current medications.  4. Hyperlipidemia: Continue treatment with rosuvastatin .  Most recent lipid profile showed an LDL of 68.  She is on rosuvastatin twice weekly.  5.  SVT and PACs: No convincing evidence of atrial fibrillation so far even though this was reported.  Continue diltiazem  and carvedilol .    Disposition: Follow-up in 6 months with an echocardiogram.  Signed,  Deatrice Cage, MD  10/12/2024 11:00 AM    [facility] "

## 2024-10-12 NOTE — Patient Instructions (Signed)
 Medication Instructions:  No changes *If you need a refill on your cardiac medications before your next appointment, please call your pharmacy*  Lab Work: None ordered If you have labs (blood work) drawn today and your tests are completely normal, you will receive your results only by: MyChart Message (if you have MyChart) OR A paper copy in the mail If you have any lab test that is abnormal or we need to change your treatment, we will call you to review the results.  Testing/Procedures: Your physician has requested that you have an echocardiogram in 6 months. Echocardiography is a painless test that uses sound waves to create images of your heart. It provides your doctor with information about the size and shape of your heart and how well your heart's chambers and valves are working. You may receive an ultrasound enhancing agent through an IV if needed to better visualize your heart during the echo.This procedure takes approximately one hour. There are no restrictions for this procedure.  This will take place at [facility], 2nd floor  Please note: We ask at that you not bring children with you during ultrasound (echo/ vascular) testing. Due to room size and safety concerns, children are not allowed in the ultrasound rooms during exams. Our front office staff cannot provide observation of children in our lobby area while testing is being conducted. An adult accompanying a patient to their appointment will only be allowed in the ultrasound room at the discretion of the ultrasound technician under special circumstances. We apologize for any inconvenience.   Follow-Up: At [facility], you and your health needs are our priority.  As part of our continuing mission to provide you with exceptional heart care, our providers are all part of one team.  This team includes your primary Cardiologist (physician) and Advanced Practice Providers or APPs (Physician Assistants and Nurse  Practitioners) who all work together to provide you with the care you need, when you need it.  Your next appointment:   6 month(s)  Provider:   Deatrice Cage, MD    We recommend signing up for the patient portal called MyChart.  Sign up information is provided on this After Visit Summary.  MyChart is used to connect with patients for Virtual Visits (Telemedicine).  Patients are able to view lab/test results, encounter notes, upcoming appointments, etc.  Non-urgent messages can be sent to your provider as well.   To learn more about what you can do with MyChart, go to [email or web address].
